# Patient Record
Sex: Male | Born: 1935 | Race: Black or African American | Hispanic: No | Marital: Married | Smoking: Never smoker
Health system: Southern US, Community
[De-identification: ages and names within clinical notes are randomized; demographics above are authoritative.]

## PROBLEM LIST (undated history)

## (undated) DIAGNOSIS — K219 Gastro-esophageal reflux disease without esophagitis: Secondary | ICD-10-CM

## (undated) DIAGNOSIS — L853 Xerosis cutis: Secondary | ICD-10-CM

## (undated) DIAGNOSIS — I719 Aortic aneurysm of unspecified site, without rupture: Secondary | ICD-10-CM

## (undated) DIAGNOSIS — J302 Other seasonal allergic rhinitis: Secondary | ICD-10-CM

## (undated) DIAGNOSIS — N400 Enlarged prostate without lower urinary tract symptoms: Secondary | ICD-10-CM

## (undated) DIAGNOSIS — K4090 Unilateral inguinal hernia, without obstruction or gangrene, not specified as recurrent: Secondary | ICD-10-CM

## (undated) DIAGNOSIS — I10 Essential (primary) hypertension: Secondary | ICD-10-CM

## (undated) DIAGNOSIS — G4730 Sleep apnea, unspecified: Secondary | ICD-10-CM

## (undated) DIAGNOSIS — B351 Tinea unguium: Secondary | ICD-10-CM

## (undated) DIAGNOSIS — R42 Dizziness and giddiness: Secondary | ICD-10-CM

## (undated) DIAGNOSIS — M1990 Unspecified osteoarthritis, unspecified site: Secondary | ICD-10-CM

## (undated) DIAGNOSIS — D696 Thrombocytopenia, unspecified: Secondary | ICD-10-CM

## (undated) DIAGNOSIS — E782 Mixed hyperlipidemia: Secondary | ICD-10-CM

## (undated) DIAGNOSIS — M5412 Radiculopathy, cervical region: Secondary | ICD-10-CM

## (undated) DIAGNOSIS — E559 Vitamin D deficiency, unspecified: Secondary | ICD-10-CM

## (undated) HISTORY — PX: CATARACT EXTRACTION W/PHACO: SHX586

## (undated) HISTORY — PX: KNEE ARTHROSCOPY: SUR90

## (undated) HISTORY — PX: EYE SURGERY: SHX253

## (undated) HISTORY — PX: COLONOSCOPY W/ POLYPECTOMY: SHX1380

## (undated) HISTORY — PX: HERNIA REPAIR: SHX51

---

## 2006-07-19 ENCOUNTER — Ambulatory Visit: Payer: Self-pay | Admitting: Pulmonary Disease

## 2006-07-25 ENCOUNTER — Ambulatory Visit: Admission: RE | Admit: 2006-07-25 | Discharge: 2006-07-25 | Payer: Self-pay | Admitting: Family Medicine

## 2009-01-18 ENCOUNTER — Encounter: Admission: RE | Admit: 2009-01-18 | Discharge: 2009-01-18 | Payer: Self-pay | Admitting: Neurology

## 2009-02-21 ENCOUNTER — Encounter: Admission: RE | Admit: 2009-02-21 | Discharge: 2009-02-21 | Payer: Self-pay | Admitting: Neurology

## 2009-03-08 ENCOUNTER — Encounter: Admission: RE | Admit: 2009-03-08 | Discharge: 2009-03-08 | Payer: Self-pay | Admitting: Neurology

## 2010-11-28 NOTE — Procedures (Signed)
NAME:  Scott Zamora, Scott Zamora NO.:  [id number]   MEDICAL RECORD NO.:  [id number]          PATIENT TYPE:  OUT   LOCATION:  SLEEP LAB                     FACILITY:  [facility]   PHYSICIAN:  Barbaraann Share, MD,FCCPDATE OF BIRTH:  09/07/36   DATE OF STUDY:  07/25/2006                            NOCTURNAL POLYSOMNOGRAM   REFERRING PHYSICIAN:  Reynolds Bowl MD   EPWORTH SCORE:  10.   SLEEP ARCHITECTURE:  The patient has a total sleep time of 224 minutes  with only 3 minutes of REM and never achieved slow wave sleep.  Sleep  onset latency was prolonged at 59 minutes and REM onset was very  prolonged at 263 minutes.  This sleep efficiency was very decreased at  56%.   RESPIRATORY DATA:  The patient underwent split night protocol where  there were found to be 37 obstructive events in the first 87 minutes of  sleep.  This gave the patient a respiratory disturbance index of 26  events per hour over that short time.  Events occurred in all body  positions and there was moderate snoring throughout.  By protocol, the  patient was then placed on a nasal CPAP mask and a chin strap was later  added because of mouth opening.  At a final pressure of 10 cm of water,  there appeared to be a good control of the patient's obstructive events.   OXYGEN DATA:  There was O2 desaturation as low as 88% prior to CPAP  initiation.   CARDIAC DATA:  No clinically significant cardiac arrhythmias were noted.   MOVEMENT-PARASOMNIA:  The patient was found to have very small numbers  of leg jerks with no significant sleep disruption.   IMPRESSION-RECOMMENDATIONS:  Split night study reveals moderate  obstructive sleep apnea with a respiratory disturbance index of 26  events per hour and O2 desaturation as low as 88% in the first half of  the night.  The patient was then placed on CPAP and ultimately titrated  to a final pressure of 10 cm of water with good control noted.      Barbaraann Share,  MD,FCCP  Diplomate, American Board of Sleep  Medicine  Electronically Signed    [facility]/MEDQ  D:  08/05/2006 16:34:53  T:  08/05/2006 22:16:13  Job:  [id number]

## 2011-08-06 DIAGNOSIS — L509 Urticaria, unspecified: Secondary | ICD-10-CM | POA: Diagnosis not present

## 2011-09-08 DIAGNOSIS — H2510 Age-related nuclear cataract, unspecified eye: Secondary | ICD-10-CM | POA: Diagnosis not present

## 2011-10-15 DIAGNOSIS — M19019 Primary osteoarthritis, unspecified shoulder: Secondary | ICD-10-CM | POA: Diagnosis not present

## 2011-11-12 DIAGNOSIS — I10 Essential (primary) hypertension: Secondary | ICD-10-CM | POA: Diagnosis not present

## 2011-11-12 DIAGNOSIS — N529 Male erectile dysfunction, unspecified: Secondary | ICD-10-CM | POA: Diagnosis not present

## 2011-11-12 DIAGNOSIS — M48061 Spinal stenosis, lumbar region without neurogenic claudication: Secondary | ICD-10-CM | POA: Diagnosis not present

## 2011-12-09 DIAGNOSIS — IMO0001 Reserved for inherently not codable concepts without codable children: Secondary | ICD-10-CM | POA: Diagnosis not present

## 2011-12-29 DIAGNOSIS — R5382 Chronic fatigue, unspecified: Secondary | ICD-10-CM | POA: Diagnosis not present

## 2011-12-29 DIAGNOSIS — G9332 Myalgic encephalomyelitis/chronic fatigue syndrome: Secondary | ICD-10-CM | POA: Diagnosis not present

## 2012-04-21 DIAGNOSIS — M19019 Primary osteoarthritis, unspecified shoulder: Secondary | ICD-10-CM | POA: Diagnosis not present

## 2012-04-28 DIAGNOSIS — I10 Essential (primary) hypertension: Secondary | ICD-10-CM | POA: Diagnosis not present

## 2012-04-28 DIAGNOSIS — Z23 Encounter for immunization: Secondary | ICD-10-CM | POA: Diagnosis not present

## 2012-04-28 DIAGNOSIS — M1990 Unspecified osteoarthritis, unspecified site: Secondary | ICD-10-CM | POA: Diagnosis not present

## 2012-06-07 DIAGNOSIS — M7660 Achilles tendinitis, unspecified leg: Secondary | ICD-10-CM | POA: Diagnosis not present

## 2012-07-28 ENCOUNTER — Encounter (HOSPITAL_COMMUNITY): Payer: Self-pay | Admitting: Pharmacy Technician

## 2012-08-05 ENCOUNTER — Encounter (HOSPITAL_COMMUNITY): Payer: Self-pay | Admitting: Pharmacy Technician

## 2012-08-05 ENCOUNTER — Encounter (HOSPITAL_COMMUNITY)
Admission: RE | Admit: 2012-08-05 | Discharge: 2012-08-05 | Disposition: A | Discharge status: Home / Self Care | Payer: Medicare Other | Source: Ambulatory Visit | Attending: Orthopedic Surgery | Admitting: Orthopedic Surgery

## 2012-08-05 ENCOUNTER — Encounter (HOSPITAL_COMMUNITY): Payer: Self-pay

## 2012-08-05 HISTORY — DX: Sleep apnea, unspecified: G47.30

## 2012-08-05 HISTORY — DX: Dizziness and giddiness: R42

## 2012-08-05 HISTORY — DX: Essential (primary) hypertension: I10

## 2012-08-05 HISTORY — DX: Unspecified osteoarthritis, unspecified site: M19.90

## 2012-08-05 HISTORY — DX: Other seasonal allergic rhinitis: J30.2

## 2012-08-05 HISTORY — DX: Xerosis cutis: L85.3

## 2012-08-05 LAB — CBC
HCT: 41.4 % (ref 39.0–52.0)
MCHC: 33.6 g/dL (ref 30.0–36.0)
MCV: 89.4 fL (ref 78.0–100.0)
RDW: 13.5 % (ref 11.5–15.5)

## 2012-08-05 LAB — TYPE AND SCREEN

## 2012-08-05 LAB — BASIC METABOLIC PANEL
BUN: 14 mg/dL (ref 6–23)
CO2: 28 mEq/L (ref 19–32)
Chloride: 102 mEq/L (ref 96–112)
Creatinine, Ser: 0.91 mg/dL (ref 0.50–1.35)

## 2012-08-05 NOTE — Progress Notes (Signed)
Patient informed Nurse that he had a stress test approximately 20+ years ago, and patient denied having a cardiac cath. Patient has mild sleep apnea and has a CPAP machine but does not wear CPAP. PCP is Dr. Kari Baars in [place], [place] office # [phone].

## 2012-08-05 NOTE — Pre-Procedure Instructions (Signed)
Scott Zamora  08/05/2012   Your procedure is scheduled on:  Thursday August 11, 2012.  Report to [facility] at 0530 AM.  Call this number if you have problems the morning of surgery: [phone]   Remember:   Do not eat food or drink liquids after midnight.   Take these medicines the morning of surgery with A SIP OF WATER: Flunisolide if needed, Gabapentin (Neurontin), Loratadine (Claritin) if needed   Do not wear jewelry  Do not wear lotions or colognes.  Men may shave face and neck.  Do not bring valuables to the hospital.  Contacts, dentures or bridgework may not be worn into surgery.  Leave suitcase in the car. After surgery it may be brought to your room.  For patients admitted to the hospital, checkout time is 11:00 AM the day of discharge.   Patients discharged the day of surgery will not be allowed to drive home.  Name and phone number of your driver:   Special Instructions: Shower using CHG 2 nights before surgery and the night before surgery.  If you shower the day of surgery use CHG.  Use special wash - you have one bottle of CHG for all showers.  You should use approximately 1/3 of the bottle for each shower.   Please read over the following fact sheets that you were given: Pain Booklet, Coughing and Deep Breathing, Blood Transfusion Information, MRSA Information and Surgical Site Infection Prevention

## 2012-08-08 NOTE — Consult Note (Signed)
Anesthesia Chart Review:  Patient is a 78 year old male scheduled for left total shoulder arthroplasty on 08/11/12 by Dr. Rennis Chris.  History includes non-smoker, OSA without CPAP use, HTN, vertigo, arthritis.  PCP is Dr. Juanetta Gosling who cleared patient for this procedure.  Preoperative labs and CXR noted.  EKG on 08/05/12 showed NSR with first degree AVB, incomplete right BBB.  Currently, there are no comparison EKGs available.    Patient has been cleared by his PCP.  No CV symptoms documented at his PAT visit.  No known history of MI/CHF or DM.  If no significant change in his status then would anticipate he could proceed as planned.  He will be evaluated by her assigned anesthesiologist on the day of surgery.  Shonna Chock, PA-C 08/08/12 1312

## 2012-08-10 MED ORDER — CHLORHEXIDINE GLUCONATE 4 % EX LIQD: 60.0000 mL | Freq: Once | CUTANEOUS | Status: DC

## 2012-08-10 MED ORDER — LACTATED RINGERS IV SOLN: INTRAVENOUS | Status: DC

## 2012-08-10 MED ORDER — CEFAZOLIN SODIUM 10 G IJ SOLR
3.0000 g | INTRAMUSCULAR | Status: AC
  Administered 2012-08-11: 3 g via INTRAVENOUS
  Filled 2012-08-10: qty 3000

## 2012-08-11 ENCOUNTER — Encounter (HOSPITAL_COMMUNITY)
Admission: RE | Disposition: A | Discharge status: Home / Self Care | Payer: Self-pay | Source: Ambulatory Visit | Attending: Orthopedic Surgery

## 2012-08-11 ENCOUNTER — Encounter (HOSPITAL_COMMUNITY): Payer: Self-pay | Admitting: Vascular Surgery

## 2012-08-11 ENCOUNTER — Encounter (HOSPITAL_COMMUNITY): Payer: Self-pay | Admitting: General Practice

## 2012-08-11 ENCOUNTER — Encounter (HOSPITAL_COMMUNITY): Payer: Self-pay | Admitting: *Deleted

## 2012-08-11 ENCOUNTER — Inpatient Hospital Stay (HOSPITAL_COMMUNITY): Payer: Medicare Other | Admitting: Vascular Surgery

## 2012-08-11 ENCOUNTER — Inpatient Hospital Stay (HOSPITAL_COMMUNITY)
Admission: RE | Admit: 2012-08-11 | Discharge: 2012-08-12 | DRG: 484 | Disposition: A | Discharge status: Home / Self Care | Payer: Medicare Other | Source: Ambulatory Visit | Attending: Orthopedic Surgery | Admitting: Orthopedic Surgery

## 2012-08-11 DIAGNOSIS — G4730 Sleep apnea, unspecified: Secondary | ICD-10-CM | POA: Diagnosis present

## 2012-08-11 DIAGNOSIS — Z79899 Other long term (current) drug therapy: Secondary | ICD-10-CM

## 2012-08-11 DIAGNOSIS — Z7982 Long term (current) use of aspirin: Secondary | ICD-10-CM

## 2012-08-11 DIAGNOSIS — Z01812 Encounter for preprocedural laboratory examination: Secondary | ICD-10-CM

## 2012-08-11 DIAGNOSIS — M19019 Primary osteoarthritis, unspecified shoulder: Principal | ICD-10-CM

## 2012-08-11 DIAGNOSIS — I10 Essential (primary) hypertension: Secondary | ICD-10-CM | POA: Diagnosis present

## 2012-08-11 HISTORY — PX: TOTAL SHOULDER ARTHROPLASTY: SHX126

## 2012-08-11 SURGERY — ARTHROPLASTY, SHOULDER, TOTAL
Anesthesia: General | Site: Shoulder | Laterality: Left | Wound class: Clean

## 2012-08-11 MED ORDER — ALUM & MAG HYDROXIDE-SIMETH 200-200-20 MG/5ML PO SUSP: 30.0000 mL | ORAL | Status: DC | PRN

## 2012-08-11 MED ORDER — PHENYLEPHRINE HCL 10 MG/ML IJ SOLN
INTRAMUSCULAR | Status: DC | PRN
  Administered 2012-08-11 (×4): 80 ug via INTRAVENOUS

## 2012-08-11 MED ORDER — METOCLOPRAMIDE HCL 5 MG/ML IJ SOLN: 5.0000 mg | Freq: Three times a day (TID) | INTRAMUSCULAR | Status: DC | PRN

## 2012-08-11 MED ORDER — METOCLOPRAMIDE HCL 10 MG PO TABS: 5.0000 mg | ORAL_TABLET | Freq: Three times a day (TID) | ORAL | Status: DC | PRN

## 2012-08-11 MED ORDER — LACTATED RINGERS IV SOLN
INTRAVENOUS | Status: DC | PRN
  Administered 2012-08-11 (×3): via INTRAVENOUS

## 2012-08-11 MED ORDER — LACTATED RINGERS IV SOLN
INTRAVENOUS | Status: DC
  Administered 2012-08-11: 75 mL/h via INTRAVENOUS
  Administered 2012-08-12: 03:00:00 via INTRAVENOUS

## 2012-08-11 MED ORDER — GABAPENTIN 100 MG PO CAPS
100.0000 mg | ORAL_CAPSULE | Freq: Every day | ORAL | Status: DC
  Administered 2012-08-12: 100 mg via ORAL
  Filled 2012-08-11: qty 1

## 2012-08-11 MED ORDER — EPHEDRINE SULFATE 50 MG/ML IJ SOLN
INTRAMUSCULAR | Status: DC | PRN
  Administered 2012-08-11: 10 mg via INTRAVENOUS
  Administered 2012-08-11: 5 mg via INTRAVENOUS
  Administered 2012-08-11 (×2): 10 mg via INTRAVENOUS
  Administered 2012-08-11: 5 mg via INTRAVENOUS

## 2012-08-11 MED ORDER — DOCUSATE SODIUM 100 MG PO CAPS
100.0000 mg | ORAL_CAPSULE | Freq: Two times a day (BID) | ORAL | Status: DC
  Administered 2012-08-11 – 2012-08-12 (×2): 100 mg via ORAL
  Filled 2012-08-11 (×3): qty 1

## 2012-08-11 MED ORDER — HYDROCHLOROTHIAZIDE 25 MG PO TABS
12.5000 mg | ORAL_TABLET | Freq: Every day | ORAL | Status: DC
  Filled 2012-08-11: qty 0.5

## 2012-08-11 MED ORDER — VANCOMYCIN HCL IN DEXTROSE 1-5 GM/200ML-% IV SOLN
INTRAVENOUS | Status: AC
  Filled 2012-08-11: qty 200

## 2012-08-11 MED ORDER — KETOROLAC TROMETHAMINE 15 MG/ML IJ SOLN
15.0000 mg | Freq: Four times a day (QID) | INTRAMUSCULAR | Status: DC
  Administered 2012-08-11 – 2012-08-12 (×4): 15 mg via INTRAVENOUS
  Filled 2012-08-11 (×7): qty 1

## 2012-08-11 MED ORDER — PHENYLEPHRINE HCL 10 MG/ML IJ SOLN
10.0000 mg | INTRAVENOUS | Status: DC | PRN
  Administered 2012-08-11: 15 ug/min via INTRAVENOUS

## 2012-08-11 MED ORDER — ROCURONIUM BROMIDE 100 MG/10ML IV SOLN
INTRAVENOUS | Status: DC | PRN
  Administered 2012-08-11: 35 mg via INTRAVENOUS

## 2012-08-11 MED ORDER — PROPOFOL 10 MG/ML IV BOLUS
INTRAVENOUS | Status: DC | PRN
  Administered 2012-08-11: 150 mg via INTRAVENOUS

## 2012-08-11 MED ORDER — HYDROCHLOROTHIAZIDE 12.5 MG PO CAPS
12.5000 mg | ORAL_CAPSULE | Freq: Every day | ORAL | Status: DC
  Administered 2012-08-12: 12.5 mg via ORAL
  Filled 2012-08-11 (×2): qty 1

## 2012-08-11 MED ORDER — CEFAZOLIN SODIUM 1-5 GM-% IV SOLN
1.0000 g | Freq: Four times a day (QID) | INTRAVENOUS | Status: AC
  Administered 2012-08-11 – 2012-08-12 (×2): 1 g via INTRAVENOUS
  Filled 2012-08-11 (×3): qty 50

## 2012-08-11 MED ORDER — PHENOL 1.4 % MT LIQD: 1.0000 | OROMUCOSAL | Status: DC | PRN

## 2012-08-11 MED ORDER — NEOSTIGMINE METHYLSULFATE 1 MG/ML IJ SOLN
INTRAMUSCULAR | Status: DC | PRN
  Administered 2012-08-11: 3 mg via INTRAVENOUS

## 2012-08-11 MED ORDER — TERAZOSIN HCL 5 MG PO CAPS
10.0000 mg | ORAL_CAPSULE | Freq: Every day | ORAL | Status: DC
  Administered 2012-08-11: 10 mg via ORAL
  Filled 2012-08-11 (×2): qty 2

## 2012-08-11 MED ORDER — ONDANSETRON HCL 4 MG/2ML IJ SOLN: 4.0000 mg | Freq: Once | INTRAMUSCULAR | Status: DC | PRN

## 2012-08-11 MED ORDER — MENTHOL 3 MG MT LOZG
1.0000 | LOZENGE | OROMUCOSAL | Status: DC | PRN
  Filled 2012-08-11: qty 9

## 2012-08-11 MED ORDER — KETOROLAC TROMETHAMINE 30 MG/ML IJ SOLN
INTRAMUSCULAR | Status: AC
  Filled 2012-08-11: qty 1

## 2012-08-11 MED ORDER — VERAPAMIL HCL ER 240 MG PO TBCR
240.0000 mg | EXTENDED_RELEASE_TABLET | Freq: Every day | ORAL | Status: DC
  Administered 2012-08-11: 240 mg via ORAL
  Filled 2012-08-11 (×2): qty 1

## 2012-08-11 MED ORDER — FLEET ENEMA 7-19 GM/118ML RE ENEM: 1.0000 | ENEMA | Freq: Once | RECTAL | Status: AC | PRN

## 2012-08-11 MED ORDER — HYDROMORPHONE HCL PF 1 MG/ML IJ SOLN
0.2500 mg | INTRAMUSCULAR | Status: DC | PRN
  Administered 2012-08-12: 1 mg via INTRAVENOUS
  Filled 2012-08-11: qty 1

## 2012-08-11 MED ORDER — ACETAMINOPHEN 650 MG RE SUPP: 650.0000 mg | Freq: Four times a day (QID) | RECTAL | Status: DC | PRN

## 2012-08-11 MED ORDER — ONDANSETRON HCL 4 MG/2ML IJ SOLN
4.0000 mg | Freq: Four times a day (QID) | INTRAMUSCULAR | Status: DC | PRN
  Administered 2012-08-11: 4 mg via INTRAVENOUS
  Filled 2012-08-11 (×2): qty 2

## 2012-08-11 MED ORDER — HYDROMORPHONE HCL PF 1 MG/ML IJ SOLN: 0.2500 mg | INTRAMUSCULAR | Status: DC | PRN

## 2012-08-11 MED ORDER — CALCIUM CHLORIDE 10 % IV SOLN
INTRAVENOUS | Status: DC | PRN
  Administered 2012-08-11 (×10): 100 mg via INTRAVENOUS

## 2012-08-11 MED ORDER — MIDAZOLAM HCL 5 MG/5ML IJ SOLN
INTRAMUSCULAR | Status: DC | PRN
  Administered 2012-08-11: 2 mg via INTRAVENOUS

## 2012-08-11 MED ORDER — ONDANSETRON HCL 4 MG/2ML IJ SOLN
INTRAMUSCULAR | Status: DC | PRN
  Administered 2012-08-11: 4 mg via INTRAVENOUS

## 2012-08-11 MED ORDER — SODIUM CHLORIDE 0.9 % IR SOLN
Status: DC | PRN
  Administered 2012-08-11: 1000 mL

## 2012-08-11 MED ORDER — OXYCODONE-ACETAMINOPHEN 5-325 MG PO TABS
1.0000 | ORAL_TABLET | ORAL | Status: DC | PRN
  Administered 2012-08-12 (×2): 2 via ORAL
  Filled 2012-08-11 (×2): qty 2

## 2012-08-11 MED ORDER — ACETAMINOPHEN 325 MG PO TABS: 650.0000 mg | ORAL_TABLET | Freq: Four times a day (QID) | ORAL | Status: DC | PRN

## 2012-08-11 MED ORDER — METHOCARBAMOL 100 MG/ML IJ SOLN
500.0000 mg | Freq: Four times a day (QID) | INTRAVENOUS | Status: DC | PRN
  Filled 2012-08-11: qty 5

## 2012-08-11 MED ORDER — DIPHENHYDRAMINE HCL 12.5 MG/5ML PO ELIX: 12.5000 mg | ORAL_SOLUTION | ORAL | Status: DC | PRN

## 2012-08-11 MED ORDER — POLYETHYLENE GLYCOL 3350 17 G PO PACK: 17.0000 g | PACK | Freq: Every day | ORAL | Status: DC | PRN

## 2012-08-11 MED ORDER — LIDOCAINE HCL (CARDIAC) 20 MG/ML IV SOLN
INTRAVENOUS | Status: DC | PRN
  Administered 2012-08-11: 100 mg via INTRAVENOUS

## 2012-08-11 MED ORDER — ACETAMINOPHEN 10 MG/ML IV SOLN
INTRAVENOUS | Status: AC
  Administered 2012-08-11: 1000 mg via INTRAVENOUS
  Filled 2012-08-11: qty 100

## 2012-08-11 MED ORDER — FENTANYL CITRATE 0.05 MG/ML IJ SOLN
INTRAMUSCULAR | Status: DC | PRN
  Administered 2012-08-11 (×2): 50 ug via INTRAVENOUS

## 2012-08-11 MED ORDER — GLYCOPYRROLATE 0.2 MG/ML IJ SOLN
INTRAMUSCULAR | Status: DC | PRN
  Administered 2012-08-11: .6 mg via INTRAVENOUS
  Administered 2012-08-11: 0.4 mg via INTRAVENOUS

## 2012-08-11 MED ORDER — TEMAZEPAM 15 MG PO CAPS: 15.0000 mg | ORAL_CAPSULE | Freq: Every evening | ORAL | Status: DC | PRN

## 2012-08-11 MED ORDER — BUPIVACAINE HCL (PF) 0.5 % IJ SOLN
INTRAMUSCULAR | Status: DC | PRN
  Administered 2012-08-11: 18 mL

## 2012-08-11 MED ORDER — BISACODYL 5 MG PO TBEC: 5.0000 mg | DELAYED_RELEASE_TABLET | Freq: Every day | ORAL | Status: DC | PRN

## 2012-08-11 MED ORDER — ONDANSETRON HCL 4 MG PO TABS: 4.0000 mg | ORAL_TABLET | Freq: Four times a day (QID) | ORAL | Status: DC | PRN

## 2012-08-11 MED ORDER — METHOCARBAMOL 500 MG PO TABS
500.0000 mg | ORAL_TABLET | Freq: Four times a day (QID) | ORAL | Status: DC | PRN
  Administered 2012-08-12 (×2): 500 mg via ORAL
  Filled 2012-08-11 (×2): qty 1

## 2012-08-11 SURGICAL SUPPLY — 69 items
BLADE SAW SGTL 83.5X18.5 (BLADE) ×2 IMPLANT
CEMENT BONE DEPUY (Cement) ×2 IMPLANT
CLOTH BEACON ORANGE TIMEOUT ST (SAFETY) ×2 IMPLANT
CLSR STERI-STRIP ANTIMIC 1/2X4 (GAUZE/BANDAGES/DRESSINGS) ×2 IMPLANT
COVER SURGICAL LIGHT HANDLE (MISCELLANEOUS) ×2 IMPLANT
DRAPE INCISE IOBAN 66X45 STRL (DRAPES) ×2 IMPLANT
DRAPE SURG 17X11 SM STRL (DRAPES) ×2 IMPLANT
DRAPE SURG 17X23 STRL (DRAPES) ×2 IMPLANT
DRAPE U-SHAPE 47X51 STRL (DRAPES) ×2 IMPLANT
DRESSING AQUACEL AQ EXTRA 4X5 (GAUZE/BANDAGES/DRESSINGS) ×2 IMPLANT
DRILL BIT 7/64X5 (BIT) IMPLANT
DRSG AQUACEL AG ADV 3.5X10 (GAUZE/BANDAGES/DRESSINGS) ×2 IMPLANT
DRSG MEPILEX BORDER 4X8 (GAUZE/BANDAGES/DRESSINGS) ×2 IMPLANT
DURAPREP 26ML APPLICATOR (WOUND CARE) ×4 IMPLANT
ELECT BLADE 4.0 EZ CLEAN MEGAD (MISCELLANEOUS) ×2
ELECT CAUTERY BLADE 6.4 (BLADE) ×2 IMPLANT
ELECT REM PT RETURN 9FT ADLT (ELECTROSURGICAL) ×2
ELECTRODE BLDE 4.0 EZ CLN MEGD (MISCELLANEOUS) ×1 IMPLANT
ELECTRODE REM PT RTRN 9FT ADLT (ELECTROSURGICAL) ×1 IMPLANT
FACESHIELD LNG OPTICON STERILE (SAFETY) ×6 IMPLANT
GLENOID ANCHOR PEG CROSSLK 48 (Orthopedic Implant) ×2 IMPLANT
GLOVE BIO SURGEON STRL SZ 6.5 (GLOVE) ×2 IMPLANT
GLOVE BIO SURGEON STRL SZ7.5 (GLOVE) ×4 IMPLANT
GLOVE BIO SURGEON STRL SZ8 (GLOVE) ×2 IMPLANT
GLOVE BIOGEL PI IND STRL 7.5 (GLOVE) ×1 IMPLANT
GLOVE BIOGEL PI IND STRL 8.5 (GLOVE) ×1 IMPLANT
GLOVE BIOGEL PI INDICATOR 7.5 (GLOVE) ×1
GLOVE BIOGEL PI INDICATOR 8.5 (GLOVE) ×1
GLOVE ECLIPSE 8.0 STRL XLNG CF (GLOVE) ×2 IMPLANT
GLOVE EUDERMIC 7 POWDERFREE (GLOVE) ×2 IMPLANT
GLOVE SS BIOGEL STRL SZ 7.5 (GLOVE) ×1 IMPLANT
GLOVE SUPERSENSE BIOGEL SZ 7.5 (GLOVE) ×1
GOWN STRL NON-REIN LRG LVL3 (GOWN DISPOSABLE) ×2 IMPLANT
GOWN STRL REIN XL XLG (GOWN DISPOSABLE) ×4 IMPLANT
HEAD CTA GLOBAL 52X21MM (Orthopedic Implant) ×2 IMPLANT
HUMERAL STEM 14MM (Trauma) ×2 IMPLANT
KIT BASIN OR (CUSTOM PROCEDURE TRAY) ×2 IMPLANT
KIT ROOM TURNOVER OR (KITS) ×2 IMPLANT
MANIFOLD NEPTUNE II (INSTRUMENTS) ×2 IMPLANT
NDL SUT 6 .5 CRC .975X.05 MAYO (NEEDLE) ×1 IMPLANT
NEEDLE HYPO 25GX1X1/2 BEV (NEEDLE) IMPLANT
NEEDLE MAYO TAPER (NEEDLE) ×2
NS IRRIG 1000ML POUR BTL (IV SOLUTION) ×2 IMPLANT
PACK SHOULDER (CUSTOM PROCEDURE TRAY) ×2 IMPLANT
PAD ARMBOARD 7.5X6 YLW CONV (MISCELLANEOUS) ×4 IMPLANT
PASSER SUT SWANSON 36MM LOOP (INSTRUMENTS) IMPLANT
PIN METAGLENE 2.5 (PIN) ×2 IMPLANT
SLING ARM FOAM STRAP LRG (SOFTGOODS) ×2 IMPLANT
SLING ARM FOAM STRAP XLG (SOFTGOODS) IMPLANT
SLING ARM IMMOBILIZER LRG (SOFTGOODS) ×2 IMPLANT
SMARTMIX MINI TOWER (MISCELLANEOUS) ×2
SPONGE LAP 18X18 X RAY DECT (DISPOSABLE) ×2 IMPLANT
SPONGE LAP 4X18 X RAY DECT (DISPOSABLE) ×2 IMPLANT
STRIP CLOSURE SKIN 1/2X4 (GAUZE/BANDAGES/DRESSINGS) ×2 IMPLANT
SUCTION FRAZIER TIP 10 FR DISP (SUCTIONS) ×2 IMPLANT
SUT BONE WAX W31G (SUTURE) IMPLANT
SUT FIBERWIRE #2 38 T-5 BLUE (SUTURE) ×4
SUT MNCRL AB 3-0 PS2 18 (SUTURE) ×2 IMPLANT
SUT VIC AB 1 CT1 27 (SUTURE) ×3
SUT VIC AB 1 CT1 27XBRD ANBCTR (SUTURE) ×3 IMPLANT
SUT VIC AB 2-0 CT1 27 (SUTURE) ×2
SUT VIC AB 2-0 CT1 TAPERPNT 27 (SUTURE) ×2 IMPLANT
SUTURE FIBERWR #2 38 T-5 BLUE (SUTURE) ×2 IMPLANT
SYR 30ML SLIP (SYRINGE) ×2 IMPLANT
SYR CONTROL 10ML LL (SYRINGE) IMPLANT
TOWEL OR 17X24 6PK STRL BLUE (TOWEL DISPOSABLE) ×2 IMPLANT
TOWEL OR 17X26 10 PK STRL BLUE (TOWEL DISPOSABLE) ×2 IMPLANT
TOWER SMARTMIX MINI (MISCELLANEOUS) ×1 IMPLANT
WATER STERILE IRR 1000ML POUR (IV SOLUTION) ×2 IMPLANT

## 2012-08-11 NOTE — Op Note (Signed)
08/11/2012  9:41 AM  PATIENT:   Scott Zamora  78 y.o. male  PRE-OPERATIVE DIAGNOSIS:  Osteoarthritis of the Left Shoudler  POST-OPERATIVE DIAGNOSIS:  same  PROCEDURE:  Left total shoulder arthroplasty 14 stem, 52X21 concentric head, 48 glenoid  SURGEON:  Lavora Brisbon, Vania Rea M.D.  ASSISTANTS: Shuford pac   ANESTHESIA:   GET + ISB  EBL: 250  SPECIMEN:  none  Drains: none   PATIENT DISPOSITION:  PACU - hemodynamically stable.    PLAN OF CARE: Admit to inpatient   Dictation# [id number], [id number]

## 2012-08-11 NOTE — Progress Notes (Signed)
UR COMPLETED  

## 2012-08-11 NOTE — Anesthesia Preprocedure Evaluation (Signed)
Anesthesia Evaluation  Patient identified by MRN, date of birth, ID band Patient awake    Reviewed: Allergy & Precautions, H&P , NPO status , Patient's Chart, lab work & pertinent test results  Airway Mallampati: I TM Distance: >3 FB Neck ROM: full    Dental   Pulmonary sleep apnea ,          Cardiovascular hypertension, Rhythm:regular Rate:Normal     Neuro/Psych    GI/Hepatic   Endo/Other    Renal/GU      Musculoskeletal   Abdominal   Peds  Hematology   Anesthesia Other Findings   Reproductive/Obstetrics                           Anesthesia Physical Anesthesia Plan  ASA: II  Anesthesia Plan: General   Post-op Pain Management: MAC Combined w/ Regional for Post-op pain   Induction: Intravenous  Airway Management Planned: Oral ETT  Additional Equipment:   Intra-op Plan:   Post-operative Plan: Extubation in OR  Informed Consent: I have reviewed the patients History and Physical, chart, labs and discussed the procedure including the risks, benefits and alternatives for the proposed anesthesia with the patient or authorized representative who has indicated his/her understanding and acceptance.     Plan Discussed with: CRNA, Anesthesiologist and Surgeon  Anesthesia Plan Comments:         Anesthesia Quick Evaluation

## 2012-08-11 NOTE — H&P (Signed)
Scott Zamora    Chief Complaint: Osteoarthritis of the Left Shoudler HPI: The patient is a 78 y.o. male with end stage left shoulder arthritis  Past Medical History  Diagnosis Date  . Hypertension   . Vertigo     hx of  . Sleep apnea     has CPAP but does not wear it  . Seasonal allergies   . Arthritis   . Dry skin     Past Surgical History  Procedure Date  . Knee arthroscopy     left knee  . Colonoscopy w/ polypectomy     History reviewed. No pertinent family history.  Social History:  reports that he has never smoked. He does not have any smokeless tobacco history on file. He reports that he does not drink alcohol or use illicit drugs.  Allergies: No Known Allergies  Medications Prior to Admission  Medication Sig Dispense Refill  . acetaminophen (TYLENOL) 325 MG tablet Take 650 mg by mouth every 6 (six) hours as needed. For pain      . aspirin EC 81 MG tablet Take 81 mg by mouth daily.      . Cholecalciferol (VITAMIN D-3) 1000 UNITS CAPS Take 1,000 Units by mouth daily.      . flunisolide (NASALIDE) 25 MCG/ACT (0.025%) SOLN Inhale 2 sprays into the lungs daily as needed. For congestion.      . gabapentin (NEURONTIN) 100 MG capsule Take 100 mg by mouth daily.      . hydrochlorothiazide (HYDRODIURIL) 25 MG tablet Take 12.5 mg by mouth daily.      Marland Kitchen KOREAN GINSENG 100 MG CAPS Take 100 mg by mouth daily.      Marland Kitchen loratadine (CLARITIN) 10 MG tablet Take 10 mg by mouth daily as needed. For allergies      . naproxen (NAPROSYN) 500 MG tablet Take 500 mg by mouth 2 (two) times daily as needed. For pain      . POTASSIUM PO Take 1 tablet by mouth daily as needed. OTC.  Takes only when experiencing lethargy due to blood pressure medication.      Marland Kitchen terazosin (HYTRIN) 10 MG capsule Take 10 mg by mouth at bedtime.      . verapamil (CALAN-SR) 240 MG CR tablet Take 240 mg by mouth at bedtime.      . vitamin B-12 (CYANOCOBALAMIN) 250 MCG tablet Take 250 mcg by mouth daily.      .  sildenafil (VIAGRA) 100 MG tablet Take 50 mg by mouth daily as needed. For erectile dysfunction         Physical Exam: left shoulder with painful and restricted ROM as noted at 07/27/12 office visit  Vitals  Temp:  [97.8 F (36.6 C)] 97.8 F (36.6 C) (01/30 0601) Pulse Rate:  [63] 63  (01/30 0601) Resp:  [18] 18  (01/30 0601) BP: (128)/(82) 128/82 mmHg (01/30 0601) SpO2:  [98 %] 98 % (01/30 0601)  Assessment/Plan  Impression: Osteoarthritis of the Left Shoudler  Plan of Action: Procedure(s): TOTAL SHOULDER ARTHROPLASTY  Dalene Robards M 08/11/2012, 7:32 AM

## 2012-08-11 NOTE — Anesthesia Postprocedure Evaluation (Signed)
  Anesthesia Post-op Note  Patient: Scott Zamora  Procedure(s) Performed: Procedure(s) (LRB) with comments: TOTAL SHOULDER ARTHROPLASTY (Left)  Patient Location: PACU  Anesthesia Type:GA combined with regional for post-op pain  Level of Consciousness: awake, alert , oriented and patient cooperative  Airway and Oxygen Therapy: Patient Spontanous Breathing  Post-op Pain: mild  Post-op Assessment: Post-op Vital signs reviewed, Patient's Cardiovascular Status Stable, Respiratory Function Stable, Patent Airway, No signs of Nausea or vomiting and Pain level controlled  Post-op Vital Signs: stable  Complications: No apparent anesthesia complications

## 2012-08-11 NOTE — Op Note (Signed)
NAMEDAVIT, VASSAR                ACCOUNT NO.:  [id number]  MEDICAL RECORD NO.:  [id number]  LOCATION:  [facility]                         FACILITY:  [facility]  PHYSICIAN:  Vania Rea. Jazlen Ogarro, M.D.  DATE OF BIRTH:  February 15, 1935  DATE OF PROCEDURE:  08/11/2012 DATE OF DISCHARGE:                              OPERATIVE REPORT   PREOPERATIVE DIAGNOSIS:  End-stage left shoulder osteoarthritis.  POSTOPERATIVE DIAGNOSIS:  End-stage left shoulder osteoarthritis.  PROCEDURE:  A left total shoulder arthroplasty utilizing a press-fit size 14, diffuse global stem, a 52 x 21 concentric humeral head and a cemented pegged 40 glenoid.  SURGEON:  Vania Rea. Amariyon Maynes, M.D.  Threasa HeadsFrench Ana A. Shuford, PAC.  ANESTHESIA:  General endotracheal as well as an interscalene block.  ESTIMATED BLOOD LOSS:  250 mL.  DRAINS:  None,  HISTORY:  Mr. Mckeone is a 78 year old gentleman who has had chronic progressive increasing bilateral shoulder pain related to end-stage arthrosis, left more symptomatic than the right.  Plain radiographs confirm end-stage arthrosis with significant periarticular osteophyte formation and bony deformity consistent with osteoarthrosis.  He is brought to the operating room at this time for planned left total shoulder arthroplasty.  I preoperatively counseled Mr. Sprinkle on treatment options as well as risks versus benefits thereof.  Possible surgical complications were reviewed including potential for bleeding, infection, neurovascular injury, persistent pain, loss of motion, anesthetic complication, failure of implant, and possible need for additional surgery.  He understands and accepts and agrees to planned procedure.  PROCEDURE IN DETAIL:  After undergoing routine preop evaluation, the patient received prophylactic antibiotics and an interscalene block was established in the holding area with the Anesthesia Department.  Placed supine on the operating table, underwent smooth induction  of a general endotracheal anesthesia.  Placed in beach-chair position and appropriately padded and protected.  The left shoulder girdle region was sterilely prepped and draped in standard fashion.  Time-out was called. An anterior approach to the left shoulder was made through a deltopectoral incision approximately 15 cm in length.  Skin flaps were elevated.  Electrocautery was used for hemostasis.  Dissection carried deeply and the cephalic vein was identified, retracted, laterally with the deltoid.  The interval was developed distally and then the upper centimeter and a half of the peg major was tenotomized for exposure. Adhesions were divided in the subacromial/subdeltoid bursal region. Conjoined tendon was identified, mobilized, and retracted medially.  The bicipital groove was identified and found a very large osteophyte emanating from the medial margin of the bicipital groove extending distally and this was removed with a rongeur, and then the bicipital groove was unroofed.  Biceps tendon was tenotomized for later tenodesis. We then divided proximally along the rotator interval towards the base of the coracoid, and then the subscapularis was divided away from the lesser tuberosity with electrocautery, and a free margin was then tagged with a series of grasping #2 FiberWire sutures leaving a cuff of tissue laterally for later repair.  We also found a very prominent osteophyte emanating from the anteroinferior aspect of the humeral head, and this was carefully dissected away allowing delivery of the humeral head through the wound and releasing the capsule inferiorly  and subsequently posteriorly to allow complete delivery of the humeral head through the wound.  Then used an osteotome to excise the majority of the overhanging osteophyte from the anteroinferior portion of the humeral head with enhanced visualization.  Once the head was exposed, we used an extramedullary guide to outline  the proposed humeral head resection at approximately 30 degrees retroversion, and this was then completed utilizing the oscillating saw.  Once this was completed, we carefully protected the rotator cuff.  We then used a series of hand reamers to gain access to the humeral medullary canal reamed up to size 14.  We then used the cookie cutter broach for the size 14 stem, and then broached up to size 14 with excellent fit and fixation.  Once this was completed, we placed a metal cap over the cut surface of the proximal humerus and then used a combination of Fukuda, pitchfork, and snake tongue retractors to expose the glenoid and performed a circumferential excision of the labrum and released the capsule from the margins of the glenoid to allow excellent visualization of the glenoid and good mild-to- moderate glenoid retroversion.  A large loose body was identified and removed from the joint.  A guidepin was then placed at the center of the glenoid.  We used the 40 reamer to produce a smooth subchondral bony surface allowing correction of the retroversion to aid neutral alignment.  The central PEG was drilled and then using the appropriate triple guide, we placed peripheral drill holes for the size 48 trial then excellent fit.  The glenoid was irrigated, meticulously dried. Cement was then mixed and introduced into the peripheral peg holes at the appropriate consistency, and the final 48 glenoid was then impacted into position with excellent fit fixation.  Once the cement was allowed to harden, we then returned our attention to the proximal humerus.  We impacted our size 14 stem, utilizing bone graft we harvested from the resected humeral head to bone graft the metaphyseal portion.  This allowed also excellent fit and fixation.  We then performed trial reductions and based on the size of the head resected the soft tissue balance, we selected the 52 x 21 humeral head, and this was showed  soft tissue balance much to our satisfaction, with 50% translation of the humeral head and glenoid.  At this point, final irrigation was completed.  We impacted the final size 52 x 21 humeral head, and a final reduction.  Hemostasis was obtained.  The subscapularis was then repaired back to the lesser tuberosity using the FiberWire.  The rotator interval was also closed with a pair of figure-of-eight Vicryl sutures. We then performed a biceps tenodesis.  The upper margin of the major with #2 FiberWire.  At this time, shoulder mobility showed excellent soft tissue balance with good motion.  The deltopectoral interval was then allowed to reapproximate and closed with #1 Vicryl figure-of-eight sutures, 2-0 Vicryl for the subcu layer, and intracuticular 3-0 Monocryl for the skin followed by Steri-Strips.  Dry dressing was applied.  The left arm was placed in a sling.  The patient was awakened, extubated, and taken to recovery room in stable condition.     Vania Rea. Ardene Remley, M.D.     [facility]/MEDQ  D:  08/11/2012  T:  08/11/2012  Job:  [id number]

## 2012-08-11 NOTE — Transfer of Care (Signed)
Immediate Anesthesia Transfer of Care Note  Patient: Scott Zamora  Procedure(s) Performed: Procedure(s) (LRB) with comments: TOTAL SHOULDER ARTHROPLASTY (Left)  Patient Location: PACU  Anesthesia Type:General  Level of Consciousness: awake, alert  and oriented  Airway & Oxygen Therapy: Patient Spontanous Breathing and Patient connected to nasal cannula oxygen  Post-op Assessment: Report given to PACU RN, Post -op Vital signs reviewed and stable and Patient moving all extremities  Post vital signs: Reviewed and stable  Complications: No apparent anesthesia complications

## 2012-08-11 NOTE — Anesthesia Procedure Notes (Signed)
Anesthesia Regional Block:  Interscalene brachial plexus block  Pre-Anesthetic Checklist: ,, timeout performed, Correct Patient, Correct Site, Correct Laterality, Correct Procedure, Correct Position, site marked, Risks and benefits discussed,  Surgical consent,  Pre-op evaluation,  At surgeon's request and post-op pain management  Laterality: Left  Prep: Maximum Sterile Barrier Precautions used, chloraprep and alcohol swabs       Needles:  Injection technique: Single-shot  Needle Type: Stimulator Needle - 40        Needle insertion depth: 4 cm   Additional Needles:  Procedures: nerve stimulator Interscalene brachial plexus block  Nerve Stimulator or Paresthesia:  Response: 0.5 mA, 0.1 ms, 4 cm  Additional Responses:   Narrative:  Start time: 08/11/2012 7:05 AM End time: 08/11/2012 7:10 AM Injection made incrementally with aspirations every 5 mL.  Performed by: Personally  Anesthesiologist: Maren Beach MD  Additional Notes: Pt accepts procedure and risks. 18cc 0.5% Marcaine w/o epi w/o difficulty or discomfort. GES

## 2012-08-11 NOTE — Op Note (Signed)
Scott Zamora, Scott Zamora NO.:  [id number]  MEDICAL RECORD NO.:  [id number]  LOCATION:  5N11C                        FACILITY:  [facility]  PHYSICIAN:  Vania Rea. Shree Espey, M.D.  DATE OF BIRTH:  11/18/1934  DATE OF PROCEDURE: DATE OF DISCHARGE:                              OPERATIVE REPORT   ADDENDUM  Tracy A. Shuford, P.A.-C was used as Geophysicist/field seismologist throughout this case essential for help with positioning of the extremity, management of the tissues retraction, implantation of the implant, exposure, wound closure, and intraoperative decision making.     Vania Rea. Loris Seelye, M.D.     K[facility]/MEDQ  D:  08/11/2012  T:  08/11/2012  Job:  [id number]

## 2012-08-12 ENCOUNTER — Encounter (HOSPITAL_COMMUNITY): Payer: Self-pay | Admitting: Orthopedic Surgery

## 2012-08-12 DIAGNOSIS — M19019 Primary osteoarthritis, unspecified shoulder: Secondary | ICD-10-CM

## 2012-08-12 MED ORDER — OXYCODONE-ACETAMINOPHEN 5-325 MG PO TABS: 1.0000 | ORAL_TABLET | ORAL | Status: DC | PRN

## 2012-08-12 MED ORDER — ONDANSETRON HCL 4 MG PO TABS: 4.0000 mg | ORAL_TABLET | Freq: Three times a day (TID) | ORAL | Status: DC | PRN

## 2012-08-12 NOTE — Discharge Summary (Signed)
PATIENT ID:      Scott Zamora  MRN:     [id number] DOB/AGE:    1934/10/01 / 78 y.o.     DISCHARGE SUMMARY  ADMISSION DATE:    08/11/2012 DISCHARGE DATE:  08/12/12  ADMISSION DIAGNOSIS: Osteoarthritis of the Left Shoudler Past Medical History  Diagnosis Date  . Hypertension   . Vertigo     hx of  . Sleep apnea     has CPAP but does not wear it  . Seasonal allergies   . Arthritis   . Dry skin     DISCHARGE DIAGNOSIS:   Principal Problem:  *Arthritis of shoulder   PROCEDURE: Procedure(s): LEFT TOTAL SHOULDER ARTHROPLASTY on 08/11/2012  CONSULTS:   none  HISTORY:  See H&P in chart.  HOSPITAL COURSE:  Scott Zamora is a 78 y.o. admitted on 08/11/2012 with a chief complaint of left shoulder pain, and found to have a diagnosis of Osteoarthritis of the Left Shoudler.  They were brought to the operating room on 08/11/2012 and underwent Procedure(s): TOTAL SHOULDER ARTHROPLASTY.    They were given perioperative antibiotics: Anti-infectives     Start     Dose/Rate Route Frequency Ordered Stop   08/11/12 1430   ceFAZolin (ANCEF) IVPB 1 g/50 mL premix        1 g 100 mL/hr over 30 Minutes Intravenous Every 6 hours 08/11/12 1336 08/12/12 0829   08/11/12 0656   vancomycin (VANCOCIN) 1 GM/200ML IVPB     Comments: HUNT, JENNIFER: cabinet override         08/11/12 0656 08/11/12 1859   08/11/12 0600   ceFAZolin (ANCEF) 3 g in dextrose 5 % 50 mL IVPB        3 g 160 mL/hr over 30 Minutes Intravenous On call to O.R. 08/10/12 1259 08/11/12 0800        .  Patient underwent the above named procedure and tolerated it well. The following day they were hemodynamically stable and pain was controlled on oral analgesics. They were neurovascularly intact to the operative extremity. OT was ordered and worked with patient per protocol. They were medically and orthopaedically stable for discharge on day 1 . Patient was voiding well and had mild gas otherwise was tolerating po's.    DIAGNOSTIC  STUDIES:  RECENT RADIOGRAPHIC STUDIES :  Dg Chest 2 View  08/05/2012  *RADIOLOGY REPORT*  Clinical Data: Preop shoulder arthroplasty  CHEST - 2 VIEW  Comparison: None.  Findings: Normal cardiac silhouette with ectatic aorta.  No effusion, infiltrate, pneumothorax peri Degenerative osteophytosis of the thoracic spine.  Degenerative shoulder changes noted in the shoulders.  IMPRESSION: No acute cardiopulmonary process.   Original Report Authenticated By: Genevive Bi, M.D.     RECENT VITAL SIGNS:  Patient Vitals for the past 24 hrs:  BP Temp Temp src Pulse Resp SpO2 Height Weight  08/12/12 0512 128/78 mmHg 98.9 F (37.2 C) Oral 89  16  95 % - -  08/11/12 2237 - - - - - - 5\' 11"  (1.803 m) 85.73 kg (189 lb)  08/11/12 2016 127/88 mmHg 98.5 F (36.9 C) Oral 97  16  100 % - -  08/11/12 1514 137/85 mmHg 97.5 F (36.4 C) - 65  16  100 % - -  08/11/12 1209 - - - 62  14  100 % - -  08/11/12 1200 - 97.4 F (36.3 C) - 62  17  100 % - -  08/11/12 1154 120/78 mmHg - - 62  17  100 % - -  08/11/12 1145 - - - 62  9  100 % - -  08/11/12 1139 118/74 mmHg - - 61  16  100 % - -  08/11/12 1130 - - - 62  19  100 % - -  08/11/12 1124 120/79 mmHg - - 63  21  100 % - -  08/11/12 1115 - 96.5 F (35.8 C) - 61  - 100 % - -  08/11/12 1110 118/76 mmHg - - 60  10  100 % - -  08/11/12 1100 - 96.3 F (35.7 C) - 65  16  100 % - -  08/11/12 1054 125/82 mmHg - - 67  19  99 % - -  08/11/12 1045 - - - 66  19  100 % - -  08/11/12 1039 115/75 mmHg - - 65  15  100 % - -  08/11/12 1030 - 96.3 F (35.7 C) - 60  14  100 % - -  08/11/12 1024 115/75 mmHg - - 63  15  100 % - -  08/11/12 1015 - 96.2 F (35.7 C) - 65  16  100 % - -  08/11/12 1010 128/79 mmHg - - 79  16  99 % - -  08/11/12 1008 - 96 F (35.6 C) - - - - - -  .  RECENT EKG RESULTS:    Orders placed during the hospital encounter of 08/05/12  . EKG 12-LEAD  . EKG 12-LEAD    DISCHARGE INSTRUCTIONS:    DISCHARGE MEDICATIONS:     Medication List      As of 08/12/2012  8:36 AM    TAKE these medications         acetaminophen 325 MG tablet   Commonly known as: TYLENOL   Take 650 mg by mouth every 6 (six) hours as needed. For pain      aspirin EC 81 MG tablet   Take 81 mg by mouth daily.      flunisolide 25 MCG/ACT (0.025%) Soln   Commonly known as: NASALIDE   Inhale 2 sprays into the lungs daily as needed. For congestion.      gabapentin 100 MG capsule   Commonly known as: NEURONTIN   Take 100 mg by mouth daily.      hydrochlorothiazide 25 MG tablet   Commonly known as: HYDRODIURIL   Take 12.5 mg by mouth daily.      KOREAN GINSENG 100 MG Caps   Take 100 mg by mouth daily.      loratadine 10 MG tablet   Commonly known as: CLARITIN   Take 10 mg by mouth daily as needed. For allergies      naproxen 500 MG tablet   Commonly known as: NAPROSYN   Take 500 mg by mouth 2 (two) times daily as needed. For pain      ondansetron 4 MG tablet   Commonly known as: ZOFRAN   Take 1 tablet (4 mg total) by mouth every 8 (eight) hours as needed for nausea.      oxyCODONE-acetaminophen 5-325 MG per tablet   Commonly known as: PERCOCET/ROXICET   Take 1-2 tablets by mouth every 4 (four) hours as needed for pain.      POTASSIUM PO   Take 1 tablet by mouth daily as needed. OTC.  Takes only when experiencing lethargy due to blood pressure medication.      sildenafil 100 MG tablet   Commonly known as:  VIAGRA   Take 50 mg by mouth daily as needed. For erectile dysfunction      terazosin 10 MG capsule   Commonly known as: HYTRIN   Take 10 mg by mouth at bedtime.      verapamil 240 MG CR tablet   Commonly known as: CALAN-SR   Take 240 mg by mouth at bedtime.      vitamin B-12 250 MCG tablet   Commonly known as: CYANOCOBALAMIN   Take 250 mcg by mouth daily.      Vitamin D-3 1000 UNITS Caps   Take 1,000 Units by mouth daily.        FOLLOW UP VISIT:       Follow-up Information    Follow up with SUPPLE,KEVIN M, MD. (call to be  seen in 10-14 days)    Contact information:   [address], [address] [place] [address] [phone]          DISCHARGE TO: Home   DISCHARGE CONDITION:  {Good  Crixus Mcaulay for Dr. Francena Hanly 08/12/2012, 8:36 AM

## 2012-08-12 NOTE — Plan of Care (Signed)
Problem: Phase II Progression Outcomes Goal: Discharge plan established Recommend 3 in 1 for home use and outpatient OT when appropriate per MD after acute care stay

## 2012-08-12 NOTE — Evaluation (Signed)
Occupational Therapy Evaluation Patient Details Name: Scott Zamora MRN: [id number] DOB: 1934/12/18 Today's Date: 08/12/2012 Time: 7829-5621 OT Time Calculation (min): 54 min  OT Assessment / Plan / Recommendation Clinical Impression  Pt demos decreased L UE ROM and function following L shoulder surgery. Pt wearing sling and NWB L UE. All education completed and no further acute OT services needed at this time. OT will sign off    OT Assessment  Progress rehab of shoulder as ordered by MD at follow-up appointment    Follow Up Recommendations  Outpatient OT;Other (comment) (when appropriate per MD)    Barriers to Discharge  None    Equipment Recommendations  3 in 1 bedside comode    Recommendations for Other Services    Frequency       Precautions / Restrictions Precautions Precautions: Shoulder Type of Shoulder Precautions: pendulums exercoses only L shoulder, AROM L elbow, wrist, hand. education provided for sling donning/doffing, bathing and dressnig techniques Precaution Booklet Issued: Yes (comment) Required Braces or Orthoses: Other Brace/Splint Other Brace/Splint: L UE sling to be worn at all times except during bathing, dressing and ROM Restrictions Weight Bearing Restrictions: Yes LUE Weight Bearing: Non weight bearing Other Position/Activity Restrictions: sling worn at all times except during bathing, dressing and ROM   Pertinent Vitals/Pain     ADL  Grooming: Performed;Wash/dry hands;Wash/dry face;Min guard;Set up Where Assessed - Grooming: Supported standing Upper Body Bathing: Simulated;Moderate assistance Where Assessed - Upper Body Bathing: Unsupported sitting Lower Body Bathing: Simulated;Moderate assistance Where Assessed - Lower Body Bathing: Unsupported sitting;Supported standing Upper Body Dressing: Performed;Moderate assistance Where Assessed - Upper Body Dressing: Unsupported sitting Lower Body Dressing: Performed;Moderate assistance Where Assessed  - Lower Body Dressing: Unsupported sitting;Supported sit to stand;Supported standing Toilet Transfer: Performed;Min Pension scheme manager Method: Other (comment) (ambulating with no AD) Acupuncturist: Raised toilet seat with arms (or 3-in-1 over toilet);Grab bars Toileting - Clothing Manipulation and Hygiene: Performed;Minimal assistance Where Assessed - Engineer, mining and Hygiene: Standing Equipment Used: Gait belt ADL Comments: Pt and wife provided wiht education and demo of bathing/dressing techniques, able to return demo    OT Diagnosis:    OT Problem List:   OT Treatment Interventions:     OT Goals    Visit Information  Last OT Received On: 08/12/12    Subjective Data  Subjective: " I hope to go home today " Patient Stated Goal: To return home   Prior Functioning     Home Living Lives With: Spouse Available Help at Discharge: Family;Available 24 hours/day Type of Home: House Home Access: Stairs to enter [employer] of Steps: 3 Home Layout: One level Alternate Level Stairs-Number of Steps: one step down into kitchen Bathroom Shower/Tub: Tub/shower unit;Walk-in shower Bathroom Toilet: Standard Bathroom Accessibility: Yes How Accessible: Accessible via walker Home Adaptive Equipment: Reacher;Straight cane;Walker - rolling;Long-handled shoehorn Prior Function Level of Independence: Independent Able to Take Stairs?: Yes Driving: Yes Vocation: Retired Musician: No difficulties Dominant Hand: Right         Vision/Perception Vision - Assessment Eye Alignment: Within Chemical engineer Perception: Within Functional Limits   Cognition  Overall Cognitive Status: Appears within functional limits for tasks assessed/performed Arousal/Alertness: Awake/alert Orientation Level: Appears intact for tasks assessed Behavior During Session: [facility] for tasks performed    Extremity/Trunk Assessment Right  Upper Extremity Assessment RUE ROM/Strength/Tone: [facility] for tasks assessed Left Upper Extremity Assessment LUE ROM/Strength/Tone: Due to precautions (elbow, wrist, hand WFL)     Mobility Bed Mobility Bed  Mobility: Supine to Sit;Sitting - Scoot to Edge of Bed Supine to Sit: 5: Supervision Sitting - Scoot to [place] of Bed: 5: Supervision Transfers Transfers: Sit to Stand;Stand to Sit Sit to Stand: 5: Supervision;4: Min guard Stand to Sit: 5: Supervision;4: Min guard     Shoulder Instructions Donning/doffing shirt without moving shoulder: Caregiver independent with task;Patient able to independently direct caregiver Method for sponge bathing under operated UE: Caregiver independent with task;Patient able to independently direct caregiver Donning/doffing sling/immobilizer: Caregiver independent with task;Patient able to independently direct caregiver Correct positioning of sling/immobilizer: Caregiver independent with task;Patient able to independently direct caregiver Pendulum exercises (written home exercise program): Caregiver independent with task;Patient able to independently direct caregiver ROM for elbow, wrist and digits of operated UE: Caregiver independent with task;Patient able to independently direct caregiver Sling wearing schedule (on at all times/off for ADL's): Caregiver independent with task;Patient able to independently direct caregiver Proper positioning of operated UE when showering: Caregiver independent with task;Patient able to independently direct caregiver Positioning of UE while sleeping: Caregiver independent with task;Patient able to independently direct caregiver   Exercise Shoulder Exercises Pendulum Exercise: AROM;Left;5 reps Elbow Flexion: AROM;Left;5 reps Elbow Extension: AROM;Left;5 reps Wrist Flexion: AROM;Left;5 reps Wrist Extension: AROM;Left;5 reps Digit Composite Flexion: AROM;Left;5 reps Composite Extension: AROM;Left;5 reps   Balance  Balance Balance Assessed: No   End of Session OT - End of Session Equipment Utilized During Treatment: Gait belt Activity Tolerance: Patient tolerated treatment well Patient left: in bed;with call bell/phone within reach;with family/visitor present;Other (comment) (seated EOB)  GO     Galen Manila 08/12/2012, 12:19 PM

## 2012-08-29 ENCOUNTER — Ambulatory Visit (HOSPITAL_COMMUNITY)
Admission: RE | Admit: 2012-08-29 | Discharge: 2012-08-29 | Disposition: A | Discharge status: Home / Self Care | Payer: Medicare Other | Source: Ambulatory Visit | Attending: Orthopedic Surgery | Admitting: Orthopedic Surgery

## 2012-08-29 DIAGNOSIS — M25519 Pain in unspecified shoulder: Secondary | ICD-10-CM | POA: Insufficient documentation

## 2012-08-29 DIAGNOSIS — Z96619 Presence of unspecified artificial shoulder joint: Secondary | ICD-10-CM | POA: Insufficient documentation

## 2012-08-29 DIAGNOSIS — M6281 Muscle weakness (generalized): Secondary | ICD-10-CM | POA: Insufficient documentation

## 2012-08-29 DIAGNOSIS — M19019 Primary osteoarthritis, unspecified shoulder: Secondary | ICD-10-CM

## 2012-08-29 DIAGNOSIS — M25619 Stiffness of unspecified shoulder, not elsewhere classified: Secondary | ICD-10-CM | POA: Insufficient documentation

## 2012-08-29 DIAGNOSIS — IMO0001 Reserved for inherently not codable concepts without codable children: Secondary | ICD-10-CM | POA: Insufficient documentation

## 2012-08-29 NOTE — Evaluation (Signed)
Occupational Therapy Evaluation  Patient Details  Name: Scott Zamora MRN: [id number] Date of Birth: 1935/06/08  Today's Date: 08/29/2012 Time: 1500-1540 OT Time Calculation (min): 40 min OT Eval 20' Manual Therapy 20' Visit#: 1 of 36  Re-eval: 09/26/12    Diagnosis: S/P Left Total Shoulder Replacement Surgical Date: 08/11/12 Next MD Visit: one month Prior Therapy: n/a  Authorization: [employer] Medicare  Authorization Time Period: before 10th visit  Authorization Visit#: 1 of 10   Past Medical History:  Past Medical History  Diagnosis Date  . Hypertension   . Vertigo     hx of  . Sleep apnea     has CPAP but does not wear it  . Seasonal allergies   . Arthritis   . Dry skin    Past Surgical History:  Past Surgical History  Procedure Laterality Date  . Knee arthroscopy      left knee  . Colonoscopy w/ polypectomy    . Total shoulder arthroplasty  08/11/2012    Dr Francena Hanly  . Total shoulder arthroplasty  08/11/2012    Procedure: TOTAL SHOULDER ARTHROPLASTY;  Surgeon: Senaida Lange, MD;  Location: [facility] OR;  Service: Orthopedics;  Laterality: Left;    Subjective S:  I just have one question, will I be able to swing a golf club by the end of May? Pertinent History: Scott Zamora has experienced decreased AROM, strength, increased pain that prohibited full participation in daily and desired recreational activities for several months to a year.  He consulted with Dr. Rennis Chris, and a total shoulder replacement was deemed the most effective treatment option.  Scott Zamora had surgery on 08/11/12 to replace his left shoulder.  He was hospitalized from 08/11/12-08/13/12.  He was discharged home with his shoulder in a sling.  He has been referred to occupational therapy for evlaution and treatment following Dr. Dub Mikes protocol.   Limitations: Please follow protocol.  He is currently 2 weeks post op.  PROM flexion to 90, ER to 30, IR to abdoment, Abd to 60.  Isometrics of abd, ext rot, and  elbow flexion, pulley for flexion NO RESISTED IR for 6 WEEKS.  2/20-3/13  Initiate AAROM, progress flexion to 90-100, ER scapular plane 30, IR scapular plane at 45 to side, AAROM ER/IR in supine with Dowel.  Rhythmic Stabilization in supine for flexion/ext, ER/IR in scapular plane, isometrics ER, FLex, Ext , ABd, pulley exercises.  Please see scanned protocol for exercises beyond 03/13 Special Tests: DASH scored 79.5 with 0 being ideal score Patient Stated Goals: I am very active, I want to get back to my normal activity level.   Pain Assessment Currently in Pain?: Yes Pain Score:   2 Pain Location: Shoulder Pain Orientation: Left Pain Type: Acute pain  Precautions/Restrictions  Precautions Precautions: Shoulder Precaution Comments: see protocol  Prior Functioning  Home Living Lives With: Spouse Available Help at Discharge: Family;Available 24 hours/day Type of Home: House Home Access: Stairs to enter [employer] of Steps: 3 Home Layout: One level Alternate Level Stairs-Number of Steps: one step down into kitchen Bathroom Shower/Tub: Tub/shower unit;Walk-in shower Bathroom Toilet: Standard Bathroom Accessibility: Yes How Accessible: Accessible via walker Home Adaptive Equipment: Reacher;Straight cane;Walker - rolling;Long-handled shoehorn Prior Function Able to Take Stairs?: Yes Driving: Yes Vocation: Retired Leisure: Hobbies-yes (Comment) Comments: enjoys golfing, gardening, bowling  Assessment ADL/Vision/Perception ADL ADL Comments: Patient is not utilizing his left arm with any functional activities.  He is able to dress himself and sponge bathes Ily.  Dominant Hand: Right Vision - History Baseline Vision: Wears glasses all the time  Cognition/Observation Cognition Overall Cognitive Status: Appears within functional limits for tasks assessed Observation/Other Assessments Observations: 14.5 healing surgical incision over anterior shoulder with sterstrips  intact  Sensation/Coordination/Edema Sensation Light Touch: Appears Intact Coordination Gross Motor Movements are Fluid and Coordinated: Yes Fine Motor Movements are Fluid and Coordinated: Yes Edema Edema:  (trace - minimal.  Resolving independently)  Additional Assessments LUE AROM (degrees) LUE Overall AROM Comments: AROM not assessed per protocol LUE PROM (degrees) LUE Overall PROM Comments: assessed in supine, ER/IR with shoulder adducted  Left Shoulder Flexion: 70 Degrees Left Shoulder ABduction: 50 Degrees Left Shoulder Internal Rotation: 80 Degrees Left Shoulder External Rotation: 15 Degrees LUE Strength LUE Overall Strength Comments: strength not assessed, per protocol Palpation Palpation: max fascial and scar restrictions noted in region of surgical incision, scapular region, pectoral region, shoulder joint     Exercise/Treatments    Manual Therapy Manual Therapy: Myofascial release Myofascial Release: MFR and manual stretching to left anterior shoulder, posterior shoulder, lateral arm, pectoral, and scapular region to decrease pain and restrictions and increase pain free mobiility, within parameters of protocol.  Occupational Therapy Assessment and Plan OT Assessment and Plan Clinical Impression Statement: A:  78 year old male s/p left total shoulder replacement on 08/11/12.  Patient presents with increased pian and fascial restrictions and decreased AROM/PROM and strength.  These deficts are causing decreased use of LUE and decreased participation in all daily activities. Pt will benefit from skilled therapeutic intervention in order to improve on the following deficits: Decreased range of motion;Decreased strength;Increased fascial restricitons;Increased muscle spasms;Pain Rehab Potential: Good OT Frequency: Min 3X/week OT Duration: 12 weeks OT Treatment/Interventions: Therapeutic exercise;Neuromuscular education;Modalities;Manual therapy;Self-care/ADL  training;Therapeutic activities;Patient/family education OT Plan: P:  Skilled OT intervention to decrease pain and fascial restrictions and increase pain free mobility and strength within parameters of protocol for return to prior level of I with all B/IADLs and leisure activites.  Treatment Plan:  MFR and manual stretching, scar release when surgical incision is completely healed.  PROM following protocol, isometrics, ball stretches, elev, ext, row, elbow- hand AROM, progress per indication on protocol.   Goals Short Term Goals Time to Complete Short Term Goals: 6 weeks Short Term Goal 1: Patient will be educated on a HEP. Short Term Goal 2: Patient will increase PROM to [facility] in LUE for increased independence with dressing and bathing. Short Term Goal 3: Patient will increase strength in his left shoulder to 3+/5 for increased ability to lift gardening tools. Short Term Goal 4: Patient will decrease pain level to 2/10 with active reaching in his left shoulder. Short Term Goal 5: Patient will decrease fascial restrictions from max to moderate in his left shoulder region.  Long Term Goals Time to Complete Long Term Goals: 12 weeks Long Term Goal 1: Patient will return to prior level of I with all B/IADLs and leisure activities.  Long Term Goal 2: Patient will increase his left shoulder AROM to [facility] for increased ability to return to golf and bowling.  Long Term Goal 3: Patient will increase his left shoulder strength to 4+/5 for increased abiilty lift his golf bag. Long Term Goal 4: Patient will decrease his left shoulder pain level to 1/10 or better when reaching overhead. Long Term Goal 5: Patient will decrease his left shoulder restrictions to minimal for increased mobility and greater participation in desired activities.   Problem List Patient Active Problem List  Diagnosis  .  Arthritis of shoulder  . S/P shoulder replacement  . Pain in joint, shoulder region  . Muscle weakness  (generalized)    End of Session Activity Tolerance: Patient tolerated treatment well General Behavior During Session: [facility] for tasks performed Cognition: [facility] for tasks performed OT Plan of Care OT Home Exercise Plan: Educated on pendulums, towel slides, tendon glides. Consulted and Agree with Plan of Care: Patient  GO Functional Assessment Tool Used: DASH scored 79.5 with ideal score being 0. Functional Limitation: Carrying, moving and handling objects Carrying, Moving and Handling Objects Current Status [phone]): At least 60 percent but less than 80 percent impaired, limited or restricted Carrying, Moving and Handling Objects Goal Status [phone]): At least 1 percent but less than 20 percent impaired, limited or restricted  Shirlean Mylar, OTR/L  08/29/2012, 5:10 PM  Physician Documentation Your signature is required to indicate approval of the treatment plan as stated above.  Please sign and either send electronically or make a copy of this report for your files and return this physician signed original.  Please mark one 1.__approve of plan  2. ___approve of plan with the following conditions.   ______________________________                                                          _____________________ Physician Signature                                                                                                             Date

## 2012-08-31 ENCOUNTER — Ambulatory Visit (HOSPITAL_COMMUNITY)
Admission: RE | Admit: 2012-08-31 | Discharge: 2012-08-31 | Disposition: A | Discharge status: Home / Self Care | Payer: Medicare Other | Source: Ambulatory Visit | Attending: Orthopedic Surgery | Admitting: Orthopedic Surgery

## 2012-08-31 NOTE — Progress Notes (Signed)
Occupational Therapy Treatment Patient Details  Name: Scott Zamora MRN: [id number] Date of Birth: 1935-04-19  Today's Date: 08/31/2012 Time: 6962-9528 OT Time Calculation (min): 37 min MFR 4132-4401 12' Therex 0272-5366 25'  Visit#: 2 of 36  Re-eval: 09/26/12    Authorization: [employer]  Authorization Time Period: before 10th visit  Authorization Visit#: 2 of 10  Subjective Symptoms/Limitations Symptoms: S: I've been working on the exercises at home. I try to not wear my sling around the house for a little bit. I always wear it when I go out. Pain Assessment Currently in Pain?: No/denies  Precautions/Restrictions  Precautions Precautions: Shoulder Precaution Comments: see protocol  Exercise/Treatments Supine Horizontal ABduction: PROM;10 reps External Rotation: PROM;10 reps Internal Rotation: PROM;10 reps Flexion: PROM;10 reps ABduction: PROM;10 reps Seated Elevation: AROM;10 reps Extension: AROM;10 reps Row: AROM;10 reps Therapy Ball Flexion: 10 reps ROM / Strengthening / Isometric Strengthening   Flexion: 3X3" Extension: 3X3" External Rotation: 3X3" Internal Rotation: 3X3" ABduction: 3X3" ADduction: 3X3"     Manual Therapy Manual Therapy: Myofascial release Myofascial Release: MFR and manual stretching to left anterior shoulder, posterior shoulder, lateral arm, pectoral, and scapular region to decrease pain and restrictions and increase pain free mobiility, within parameters of protocol.  Occupational Therapy Assessment and Plan OT Assessment and Plan Clinical Impression Statement: A: Increase tightness during PROM with pain at end of stretch. Patient tolerated exercises overall. Reviewed protocol and discussed what he is allowed to do and what he's not. Pt will benefit from skilled therapeutic intervention in order to improve on the following deficits: Decreased range of motion;Decreased strength;Increased fascial restricitons;Increased muscle  spasms;Pain OT Plan: P: cont. to work on increasing PROM in a pain free zone and decrease restrictions to increase pain free mobility.   Goals Short Term Goals Time to Complete Short Term Goals: 6 weeks Short Term Goal 1: Patient will be educated on a HEP. Short Term Goal 1 Progress: Progressing toward goal Short Term Goal 2: Patient will increase PROM to [facility] in [place] for increased independence with dressing and bathing. Short Term Goal 2 Progress: Progressing toward goal Short Term Goal 3: Patient will increase strength in his left shoulder to 3+/5 for increased ability to lift gardening tools. Short Term Goal 3 Progress: Progressing toward goal Short Term Goal 4: Patient will decrease pain level to 2/10 with active reaching in his left shoulder. Short Term Goal 4 Progress: Progressing toward goal Short Term Goal 5: Patient will decrease fascial restrictions from max to moderate in his left shoulder region.  Short Term Goal 5 Progress: Progressing toward goal Long Term Goals Time to Complete Long Term Goals: 12 weeks Long Term Goal 1: Patient will return to prior level of I with all B/IADLs and leisure activities.  Long Term Goal 1 Progress: Progressing toward goal Long Term Goal 2: Patient will increase his left shoulder AROM to [facility] for increased ability to return to golf and bowling.  Long Term Goal 2 Progress: Progressing toward goal Long Term Goal 3: Patient will increase his left shoulder strength to 4+/5 for increased abiilty lift his golf bag. Long Term Goal 3 Progress: Progressing toward goal Long Term Goal 4: Patient will decrease his left shoulder pain level to 1/10 or better when reaching overhead. Long Term Goal 4 Progress: Progressing toward goal Long Term Goal 5: Patient will decrease his left shoulder restrictions to minimal for increased mobility and greater participation in desired activities.  Long Term Goal 5 Progress: Progressing toward goal  Problem  List Patient  Active Problem List  Diagnosis  . Arthritis of shoulder  . S/P shoulder replacement  . Pain in joint, shoulder region  . Muscle weakness (generalized)    End of Session Activity Tolerance: Patient tolerated treatment well General Behavior During Session: [facility] for tasks performed Cognition: [facility] for tasks performed  [name], OTR/L  08/31/2012, 3:20 PM

## 2012-09-02 ENCOUNTER — Ambulatory Visit (HOSPITAL_COMMUNITY)
Admission: RE | Admit: 2012-09-02 | Discharge: 2012-09-02 | Disposition: A | Discharge status: Home / Self Care | Payer: Medicare Other | Source: Ambulatory Visit | Attending: Orthopedic Surgery | Admitting: Orthopedic Surgery

## 2012-09-02 DIAGNOSIS — M25519 Pain in unspecified shoulder: Secondary | ICD-10-CM

## 2012-09-02 DIAGNOSIS — M6281 Muscle weakness (generalized): Secondary | ICD-10-CM

## 2012-09-02 DIAGNOSIS — Z96619 Presence of unspecified artificial shoulder joint: Secondary | ICD-10-CM

## 2012-09-02 NOTE — Progress Notes (Signed)
Occupational Therapy Treatment Patient Details  Name: Scott Zamora MRN: [id number] Date of Birth: 10/27/1934  Today's Date: 09/02/2012 Time: 4098-1191 OT Time Calculation (min): 46 min Manual Therapy [id number] 23' Therapeutic Exercises [phone] 23' Visit#: 3 of 36  Re-eval: 09/26/12    Authorization: [employer]  Authorization Time Period: before 10th visit  Authorization Visit#: 3 of 10  Subjective Symptoms/Limitations Symptoms: S:  My back has been hurting and Im not quite sure why. Limitations: Please follow protocol.  He is currently 2 weeks post op.  PROM flexion to 90, ER to 30, IR to abdoment, Abd to 60.  Isometrics of abd, ext rot, and elbow flexion, pulley for flexion NO RESISTED IR for 6 WEEKS.  2/20-3/13  Initiate AAROM, progress flexion to 90-100, ER scapular plane 30, IR scapular plane at 45 to side, AAROM ER/IR in supine with Dowel.  Rhythmic Stabilization in supine for flexion/ext, ER/IR in scapular plane, isometrics ER, FLex, Ext , ABd, pulley exercises.  Please see scanned protocol for exercises beyond 03/13 Pain Assessment Currently in Pain?: Yes Pain Score:   2 Pain Location: Shoulder Pain Orientation: Left Pain Type: Acute pain  Precautions/Restrictions    Please follow protocol.  He is currently 2 weeks post op.  PROM flexion to 90, ER to 30, IR to abdoment, Abd to 60.  Isometrics of abd, ext rot, and elbow flexion, pulley for flexion NO RESISTED IR for 6 WEEKS.  2/20-3/13  Initiate AAROM, progress flexion to 90-100, ER scapular plane 30, IR scapular plane at 45 to side, AAROM ER/IR in supine with Dowel.  Rhythmic Stabilization in supine for flexion/ext, ER/IR in scapular plane, isometrics ER, FLex, Ext , ABd, pulley exercises.  Please see scanned protocol for exercises beyond 03/13   Exercise/Treatments Supine Protraction: PROM;10 reps Horizontal ABduction: PROM;10 reps External Rotation: PROM;10 reps Internal Rotation: PROM;10 reps Flexion: PROM;10  reps ABduction: PROM;10 reps Other Supine Exercises: bridging x 15  Other Supine Exercises: serratus anterior puch wtih faciliation from OTR/L  Seated Elevation: AROM;10 reps Extension: AROM;10 reps Row: AROM;10 reps Therapy Ball Flexion: 15 reps ABduction: 15 reps (on small green therapy ball) ROM / Strengthening / Isometric Strengthening   Flexion: 3X5" Extension: 3X5" External Rotation: 3X5" Internal Rotation: 3X5" ABduction: 3X5" ADduction: 3X5"    Manual Therapy Manual Therapy: Myofascial release Myofascial Release: MFR and manual stretching to left anterior shoulder, posterior shoulder, lateral arm, pectoral, and scapular region to decrease pain and restrictions and increase pain free mobiility, within parameters of protocol.[id number]  Occupational Therapy Assessment and Plan OT Assessment and Plan Clinical Impression Statement: A:  Increased external rotation PROM.  Required max vg with ball stretches to depress shoulder.  transitioned from large blue therapy ball to green therapy ball for stretches into abduction for less pain and less compensatory shoulder movements. OT Plan: P:  Resume ball stretches with large blue therapy ball as patients PROM and mobility improves.  Increase to 5 x 5" with isometric strengthening.    Goals Short Term Goals Time to Complete Short Term Goals: 6 weeks Short Term Goal 1: Patient will be educated on a HEP. Short Term Goal 2: Patient will increase PROM to [facility] in [place] for increased independence with dressing and bathing. Short Term Goal 3: Patient will increase strength in his left shoulder to 3+/5 for increased ability to lift gardening tools. Short Term Goal 4: Patient will decrease pain level to 2/10 with active reaching in his left shoulder. Short Term Goal 5: Patient will decrease  fascial restrictions from max to moderate in his left shoulder region.  Long Term Goals Time to Complete Long Term Goals: 12 weeks Long Term Goal 1: Patient  will return to prior level of I with all B/IADLs and leisure activities.  Long Term Goal 2: Patient will increase his left shoulder AROM to [facility] for increased ability to return to golf and bowling.  Long Term Goal 3: Patient will increase his left shoulder strength to 4+/5 for increased abiilty lift his golf bag. Long Term Goal 4: Patient will decrease his left shoulder pain level to 1/10 or better when reaching overhead. Long Term Goal 5: Patient will decrease his left shoulder restrictions to minimal for increased mobility and greater participation in desired activities.   Problem List Patient Active Problem List  Diagnosis  . Arthritis of shoulder  . S/P shoulder replacement  . Pain in joint, shoulder region  . Muscle weakness (generalized)    End of Session Activity Tolerance: Patient tolerated treatment well General Behavior During Session: [facility] for tasks performed Cognition: [facility] for tasks performed   Shirlean Mylar, OTR/L  09/02/2012, 3:24 PM

## 2012-09-05 ENCOUNTER — Ambulatory Visit (HOSPITAL_COMMUNITY)
Admission: RE | Admit: 2012-09-05 | Discharge: 2012-09-05 | Disposition: A | Discharge status: Home / Self Care | Payer: Medicare Other | Source: Ambulatory Visit | Attending: Orthopedic Surgery | Admitting: Orthopedic Surgery

## 2012-09-05 DIAGNOSIS — M25512 Pain in left shoulder: Secondary | ICD-10-CM

## 2012-09-05 DIAGNOSIS — M6281 Muscle weakness (generalized): Secondary | ICD-10-CM

## 2012-09-05 DIAGNOSIS — Z96612 Presence of left artificial shoulder joint: Secondary | ICD-10-CM

## 2012-09-05 NOTE — Progress Notes (Signed)
Occupational Therapy Treatment Patient Details  Name: Scott Zamora MRN: [id number] Date of Birth: [birth year]  Today's Date: 09/05/2012 Time: 1025-1105 OT Time Calculation (min): 40 min Manual Therapy 17' Therapeutic Exercises 23' Visit#: 4 of 36  Re-eval: 09/26/12    Authorization: [employer]  Authorization Time Period: before 10th visit  Authorization Visit#: 4 of 10  Subjective Symptoms/Limitations Symptoms: S:  I accidentally pushed up from the table with my left arm yesterday.  It didnt feel so good. Pertinent History: Scott Zamora has experienced decreased AROM, strength, increased pain that prohibited full participation in daily and desired recreational activities for several months to a year.  He consulted with Dr. Rennis Chris, and a total shoulder replacement was deemed the most effective treatment option.  Scott Zamora had surgery on 08/11/12 to replace his left shoulder.  He was hospitalized from 08/11/12-08/13/12.  He was discharged home with his shoulder in a sling.  He has been referred to occupational therapy for evlaution and treatment following Dr. Dub Mikes protocol.   Limitations: Please follow protocol.  He is currently 2 weeks post op.  PROM flexion to 90, ER to 30, IR to abdoment, Abd to 60.  Isometrics of abd, ext rot, and elbow flexion, pulley for flexion NO RESISTED IR for 6 WEEKS.  2/20-3/13  Initiate AAROM, progress flexion to 90-100, ER scapular plane 30, IR scapular plane at 45 to side, AAROM ER/IR in supine with Dowel.  Rhythmic Stabilization in supine for flexion/ext, ER/IR in scapular plane, isometrics ER, FLex, Ext , ABd, pulley exercises.  Please see scanned protocol for exercises beyond 03/13 Pain Assessment Currently in Pain?: Yes Pain Score:   3  Precautions/Restrictions  Please follow protocol.  He is currently 2 weeks post op.  PROM flexion to 90, ER to 30, IR to abdoment, Abd to 60.  Isometrics of abd, ext rot, and elbow flexion, pulley for flexion NO  RESISTED IR for 6 WEEKS.  2/20-3/13  Initiate AAROM, progress flexion to 90-100, ER scapular plane 30, IR scapular plane at 45 to side, AAROM ER/IR in supine with Dowel.  Rhythmic Stabilization in supine for flexion/ext, ER/IR in scapular plane, isometrics ER, FLex, Ext , ABd, pulley exercises.  Please see scanned protocol for exercises beyond 03/13     Exercise/Treatments Supine Protraction: PROM;10 reps Horizontal ABduction: PROM;10 reps External Rotation: PROM;10 reps Internal Rotation: PROM;10 reps Flexion: PROM;10 reps ABduction: PROM;10 reps Other Supine Exercises: bridging x 20 Other Supine Exercises: serratus anterior puch wtih faciliation from OTR/L  Seated Elevation: AROM;10 reps Extension: AROM;10 reps Row: AROM;10 reps Pulleys Flexion: 1 minute;Limitations Flexion Limitations: with max facilitation ABduction: 1 minute;Limitations ABduction Limitations: with max facilitation Therapy Ball Flexion: 20 reps (smaller green therapy ball) ABduction: 20 reps (smaller green therapy ball) ROM / Strengthening / Isometric Strengthening Other ROM/Strengthening Exercises: supine AROM elbow flexion/extension, supination/pronation, wrist flexion/extension x 10 reps Flexion: Supine;5X5" Extension: Supine;5X5" External Rotation: Supine;5X5" Internal Rotation: Supine;5X5" ABduction: Supine;5X5" ADduction: Supine;5X5"    Manual Therapy Manual Therapy: Myofascial release Myofascial Release: MFR and manual stretching to left anterior shoulder, posterior shoulder, lateral arm, pectoral, and scapular region to decrease pain and restrictions and increase pain free mobiility, within parameters of protocol.  Occupational Therapy Assessment and Plan OT Assessment and Plan Clinical Impression Statement: A:  Increased stiffness with PROM through flexion and abduction.  External rotation has increased PROM.  Increased to 5" contraction with isometrics.  Patients protocol allows for AAROM,  however, PROM requires improvement prior to begininning.  OT Plan:  P:  Resume ball stretches with large blue therapy ball.  Improve PROM to parameters of protocol.   Goals Short Term Goals Time to Complete Short Term Goals: 6 weeks Short Term Goal 1: Patient will be educated on a HEP. Short Term Goal 1 Progress: Progressing toward goal Short Term Goal 2: Patient will increase PROM to [facility] in [place] for increased independence with dressing and bathing. Short Term Goal 2 Progress: Progressing toward goal Short Term Goal 3: Patient will increase strength in his left shoulder to 3+/5 for increased ability to lift gardening tools. Short Term Goal 3 Progress: Progressing toward goal Short Term Goal 4: Patient will decrease pain level to 2/10 with active reaching in his left shoulder. Short Term Goal 4 Progress: Progressing toward goal Short Term Goal 5: Patient will decrease fascial restrictions from max to moderate in his left shoulder region.  Short Term Goal 5 Progress: Progressing toward goal Long Term Goals Time to Complete Long Term Goals: 12 weeks Long Term Goal 1: Patient will return to prior level of I with all B/IADLs and leisure activities.  Long Term Goal 1 Progress: Progressing toward goal Long Term Goal 2: Patient will increase his left shoulder AROM to [facility] for increased ability to return to golf and bowling.  Long Term Goal 2 Progress: Progressing toward goal Long Term Goal 3: Patient will increase his left shoulder strength to 4+/5 for increased abiilty lift his golf bag. Long Term Goal 3 Progress: Progressing toward goal Long Term Goal 4: Patient will decrease his left shoulder pain level to 1/10 or better when reaching overhead. Long Term Goal 4 Progress: Progressing toward goal Long Term Goal 5: Patient will decrease his left shoulder restrictions to minimal for increased mobility and greater participation in desired activities.  Long Term Goal 5 Progress: Progressing toward  goal  Problem List Patient Active Problem List  Diagnosis  . Arthritis of shoulder  . S/P shoulder replacement  . Pain in joint, shoulder region  . Muscle weakness (generalized)    End of Session Activity Tolerance: Patient tolerated treatment well General Behavior During Session: [facility] for tasks performed Cognition: [facility] for tasks performed   Shirlean Mylar, OTR/L  09/05/2012, 11:13 AM

## 2012-09-07 ENCOUNTER — Ambulatory Visit (HOSPITAL_COMMUNITY)
Admission: RE | Admit: 2012-09-07 | Discharge: 2012-09-07 | Disposition: A | Discharge status: Home / Self Care | Payer: Medicare Other | Source: Ambulatory Visit | Attending: Pulmonary Disease | Admitting: Pulmonary Disease

## 2012-09-07 DIAGNOSIS — M6281 Muscle weakness (generalized): Secondary | ICD-10-CM

## 2012-09-07 DIAGNOSIS — M25512 Pain in left shoulder: Secondary | ICD-10-CM

## 2012-09-07 DIAGNOSIS — Z96612 Presence of left artificial shoulder joint: Secondary | ICD-10-CM

## 2012-09-07 NOTE — Progress Notes (Signed)
Occupational Therapy Treatment Patient Details  Name: Scott Zamora MRN: [id number] Date of Birth: August 25, 1934  Today's Date: 09/07/2012 Time: 4098-1191 OT Time Calculation (min): 45 min Manual Therapy 1400-1425 25' Therapeutic Exercises 4782-9562 [phone] no charge) 10' Visit#: 5 of 36  Re-eval: 09/26/12    Authorization: [facility] Medicare  Authorization Time Period: before 10th visit  Authorization Visit#: 5 of 10  Subjective Symptoms/Limitations Symptoms: S: It feels so good sometimes I forget that it was hurt and I want to use it. Limitations: Please follow protocol.  NO RESISTED IR for 6 WEEKS.  2/20-3/13  Initiate AAROM, progress flexion to 90-100, ER scapular plane 30, IR scapular plane at 45 to side, AAROM ER/IR in supine with Dowel.  Rhythmic Stabilization in supine for flexion/ext, ER/IR in scapular plane, isometrics ER, FLex, Ext , ABd, pulley exercises.  Please see scanned protocol for exercises beyond 03/13 Pain Assessment Currently in Pain?: Yes Pain Score:   2 Pain Location: Shoulder Pain Orientation: Left Pain Type: Acute pain  Precautions/Restrictions    Please follow protocol.  NO RESISTED IR for 6 WEEKS.  2/20-3/13  Initiate AAROM, progress flexion to 90-100, ER scapular plane 30, IR scapular plane at 45 to side, AAROM ER/IR in supine with Dowel.  Rhythmic Stabilization in supine for flexion/ext, ER/IR in scapular plane, isometrics ER, FLex, Ext , ABd, pulley exercises.  Please see scanned protocol for exercises beyond 03/13  Exercise/Treatments Supine Protraction: PROM;10 reps;AAROM;Limitations Protraction Limitations: hand over hand facilitation and dowel rod utilized for AAROM Horizontal ABduction: PROM;10 reps;AAROM;5 reps;Limitations Horizontal ABduction Limitations: hand over hand facilitation and dowel rod with AAROM External Rotation: PROM;10 reps;AAROM;5 reps;Limitations External Rotation Limitations: hand over hand facilitation and dowel utiilzed with  AAROM Internal Rotation: PROM;10 reps;AAROM;5 reps;Limitations Internal Rotation Limitations: hand over hand facilitation and dowel utilized for AAROM Flexion: PROM;10 reps;AAROM;5 reps;Limitations Flexion Limitations: hand over hand facilitation and dowel utilized with AAROM ABduction: PROM;10 reps Other Supine Exercises: bridging x 20 Seated Elevation: AROM;15 reps Extension: AROM;15 reps Row: AROM;15 reps Pulleys Flexion: 1 minute;Limitations Flexion Limitations: with max facilitation ABduction: 1 minute;Limitations ABduction Limitations: with max facilitation Therapy Ball Flexion: 20 reps (large blue therapy ball) ABduction: 20 reps (large blue therapy ball) ROM / Strengthening / Isometric Strengthening Prot/Ret//Elev/Dep: 1' Flexion: Supine;5X5" Extension: Supine;5X5" External Rotation: Supine;5X5" Internal Rotation: Supine;5X5" ABduction: Supine;5X5" ADduction: Supine;5X5"    Manual Therapy Manual Therapy: Myofascial release Myofascial Release: MFR and manual stretching to left anterior shoulder, posterior shoulder, lateral arm, pectoral, and scapular region to decrease pain and restrictions and increase pain free mobiility, within parameters of protocol.  Occupational Therapy Assessment and Plan OT Assessment and Plan Clinical Impression Statement: A:  Increased all PROM this date.  Flexion 90, abduction 90, external rotation with shoulder adducted 30.  Able to complete AAROM in supine with hand over hand facilitation from therapist.  Able to complete ball stretches with larger ball, as PROM has improved.  OT Plan: P:  Increase I with AAROM exercises, following protocol.   Goals Short Term Goals Time to Complete Short Term Goals: 6 weeks Short Term Goal 1: Patient will be educated on a HEP. Short Term Goal 2: Patient will increase PROM to [facility] in [place] for increased independence with dressing and bathing. Short Term Goal 3: Patient will increase strength in his left  shoulder to 3+/5 for increased ability to lift gardening tools. Short Term Goal 4: Patient will decrease pain level to 2/10 with active reaching in his left shoulder. Short Term Goal 5:  Patient will decrease fascial restrictions from max to moderate in his left shoulder region.  Long Term Goals Time to Complete Long Term Goals: 12 weeks Long Term Goal 1: Patient will return to prior level of I with all B/IADLs and leisure activities.  Long Term Goal 2: Patient will increase his left shoulder AROM to [facility] for increased ability to return to golf and bowling.  Long Term Goal 3: Patient will increase his left shoulder strength to 4+/5 for increased abiilty lift his golf bag. Long Term Goal 4: Patient will decrease his left shoulder pain level to 1/10 or better when reaching overhead. Long Term Goal 5: Patient will decrease his left shoulder restrictions to minimal for increased mobility and greater participation in desired activities.   Problem List Patient Active Problem List  Diagnosis  . Arthritis of shoulder  . S/P shoulder replacement  . Pain in joint, shoulder region  . Muscle weakness (generalized)    End of Session Activity Tolerance: Patient tolerated treatment well General Behavior During Session: [facility] for tasks performed Cognition: [facility] for tasks performed   Shirlean Mylar, OTR/L  09/07/2012, 3:16 PM

## 2012-09-09 ENCOUNTER — Ambulatory Visit (HOSPITAL_COMMUNITY)
Admission: RE | Admit: 2012-09-09 | Discharge: 2012-09-09 | Disposition: A | Discharge status: Home / Self Care | Payer: Medicare Other | Source: Ambulatory Visit | Attending: Orthopedic Surgery | Admitting: Orthopedic Surgery

## 2012-09-09 DIAGNOSIS — Z96612 Presence of left artificial shoulder joint: Secondary | ICD-10-CM

## 2012-09-09 DIAGNOSIS — M6281 Muscle weakness (generalized): Secondary | ICD-10-CM

## 2012-09-09 DIAGNOSIS — M25512 Pain in left shoulder: Secondary | ICD-10-CM

## 2012-09-09 NOTE — Progress Notes (Signed)
Occupational Therapy Treatment Patient Details  Name: Scott Zamora MRN: [id number] Date of Birth: [birth year]  Today's Date: 09/09/2012 Time: 1435-1530 OT Time Calculation (min): 55 min MFR 1435-1450 15' Therex 1450-1530 40  Visit#: 6 of 36  Re-eval: 09/26/12    Authorization: [employer]  Authorization Time Period: before 10th visit  Authorization Visit#: 6 of 10  Subjective Symptoms/Limitations Symptoms: S: My arm wants to help too much some times. Pain Assessment Currently in Pain?: No/denies  Precautions/Restrictions  Precautions Precautions: Shoulder Precaution Comments: see protocol  Exercise/Treatments Supine Protraction: PROM;AAROM;10 reps Horizontal ABduction: PROM;AAROM;10 reps External Rotation: PROM;AAROM;10 reps Internal Rotation: PROM;AAROM;10 reps Flexion: PROM;AAROM;10 reps ABduction: PROM;AAROM;10 reps Other Supine Exercises: time Other Supine Exercises: time Seated Elevation: AROM;15 reps Extension: AROM;15 reps Row: AROM;15 reps Pulleys Flexion: 1 minute ABduction: 1 minute Therapy Ball Flexion: 20 reps (large blue ball) ABduction: 20 reps (large blue ball) ROM / Strengthening / Isometric Strengthening Other ROM/Strengthening Exercises: time      Manual Therapy Manual Therapy: Myofascial release Myofascial Release: MFR and manual stretching to left anterior shoulder, posterior shoulder, lateral arm, pectoral, and scapular region to decrease pain and restrictions and increase pain free mobiility, within parameters of protocol.  Occupational Therapy Assessment and Plan OT Assessment and Plan Clinical Impression Statement: A: Able to complete AAROM in supine without hand over hand facilitation. No facilitation needed during pulleys. Only needed initial set up of arms before beginning. OT Plan: P:  Increase I with AAROM exercises, follwing protocol. Less verbal cues.   Goals Short Term Goals Time to Complete Short Term Goals: 6  weeks Short Term Goal 1: Patient will be educated on a HEP. Short Term Goal 2: Patient will increase PROM to [facility] in [place] for increased independence with dressing and bathing. Short Term Goal 3: Patient will increase strength in his left shoulder to 3+/5 for increased ability to lift gardening tools. Short Term Goal 4: Patient will decrease pain level to 2/10 with active reaching in his left shoulder. Short Term Goal 5: Patient will decrease fascial restrictions from max to moderate in his left shoulder region.  Long Term Goals Time to Complete Long Term Goals: 12 weeks Long Term Goal 1: Patient will return to prior level of I with all B/IADLs and leisure activities.  Long Term Goal 2: Patient will increase his left shoulder AROM to [facility] for increased ability to return to golf and bowling.  Long Term Goal 3: Patient will increase his left shoulder strength to 4+/5 for increased abiilty lift his golf bag. Long Term Goal 4: Patient will decrease his left shoulder pain level to 1/10 or better when reaching overhead. Long Term Goal 5: Patient will decrease his left shoulder restrictions to minimal for increased mobility and greater participation in desired activities.   Problem List Patient Active Problem List  Diagnosis  . Arthritis of shoulder  . S/P shoulder replacement  . Pain in joint, shoulder region  . Muscle weakness (generalized)    End of Session Activity Tolerance: Patient tolerated treatment well General Behavior During Session: [facility] for tasks performed Cognition: [facility] for tasks performed   [name], OTR/L  09/09/2012, 4:13 PM

## 2012-09-12 ENCOUNTER — Ambulatory Visit (HOSPITAL_COMMUNITY): Payer: Medicare Other

## 2012-09-14 ENCOUNTER — Ambulatory Visit (HOSPITAL_COMMUNITY)
Admission: RE | Admit: 2012-09-14 | Discharge: 2012-09-14 | Disposition: A | Discharge status: Home / Self Care | Payer: Medicare Other | Source: Ambulatory Visit | Attending: Pulmonary Disease | Admitting: Pulmonary Disease

## 2012-09-14 DIAGNOSIS — Z96612 Presence of left artificial shoulder joint: Secondary | ICD-10-CM

## 2012-09-14 DIAGNOSIS — M25619 Stiffness of unspecified shoulder, not elsewhere classified: Secondary | ICD-10-CM | POA: Insufficient documentation

## 2012-09-14 DIAGNOSIS — M6281 Muscle weakness (generalized): Secondary | ICD-10-CM

## 2012-09-14 DIAGNOSIS — IMO0001 Reserved for inherently not codable concepts without codable children: Secondary | ICD-10-CM | POA: Insufficient documentation

## 2012-09-14 DIAGNOSIS — M25519 Pain in unspecified shoulder: Secondary | ICD-10-CM | POA: Insufficient documentation

## 2012-09-14 DIAGNOSIS — M25512 Pain in left shoulder: Secondary | ICD-10-CM

## 2012-09-14 NOTE — Progress Notes (Signed)
Occupational Therapy Treatment Patient Details  Name: Scott Zamora MRN: [id number] Date of Birth: 06-03-1935  Today's Date: 09/14/2012 Time: 6962-9528 OT Time Calculation (min): 44 min Manual Therapy 4132-4401 16' Therapeutic Exercises [id number] 28' Visit#: 7 of 36  Re-eval: 09/26/12    Authorization: [employer]  Authorization Time Period: before 10th visit  Authorization Visit#: 7 of 10  Subjective S:  I hope I can get rid of this sling. Limitations: Please follow protocol.  NO RESISTED IR for 6 WEEKS.  2/20-3/13  Initiate AAROM, progress flexion to 90-100, ER scapular plane 30, IR scapular plane at 45 to side, AAROM ER/IR in supine with Dowel.  Rhythmic Stabilization in supine for flexion/ext, ER/IR in scapular plane, isometrics ER, FLex, Ext , ABd, pulley exercises.  Please see scanned protocol for exercises beyond 03/13 Pain Assessment Currently in Pain?: No/denies Pain Score: 0-No pain  Precautions/Restrictions     Please follow protocol.  NO RESISTED IR for 6 WEEKS.  2/20-3/13  Initiate AAROM, progress flexion to 90-100, ER scapular plane 30, IR scapular plane at 45 to side, AAROM ER/IR in supine with Dowel.  Rhythmic Stabilization in supine for flexion/ext, ER/IR in scapular plane, isometrics ER, FLex, Ext , ABd, pulley exercises.  Please see scanned protocol for exercises beyond 03/13   Exercise/Treatments Supine Protraction: PROM;AAROM;10 reps Horizontal ABduction: PROM;AAROM;10 reps External Rotation: PROM;AAROM;10 reps Internal Rotation: PROM;AAROM;10 reps Flexion: PROM;AAROM;10 reps ABduction: PROM;AAROM;10 reps Other Supine Exercises: dc Other Supine Exercises: dc Seated Elevation: AROM;15 reps Extension: AROM;15 reps Row: AROM;15 reps Prone    Sidelying   Standing   Pulleys Flexion: 2 minutes ABduction: 1 minute;Limitations ABduction Limitations: attempted 2', and patient unable to complete due to arm fatigue.  Requires mod vg to relax scapular  region when completing pulley exercises  Therapy Ball Flexion: 20 reps ABduction: 20 reps ROM / Strengthening / Isometric Strengthening Proximal Shoulder Strengthening, Supine: 10 reps with facilitation/AAROM  Rhythmic Stabilization, Supine: 15"      Manual Therapy Manual Therapy: Myofascial release Myofascial Release: MFR and manual stretching to left anterior shoulder, posterior shoulder, lateral arm, pectoral, and scapular region to decrease pain and restrictions and increase pain free mobiility, within parameters of protocol. [phone]  Occupational Therapy Assessment and Plan OT Assessment and Plan Clinical Impression Statement: A:  Continues to have significant stiffness and hard end feel with flexion and abduction PROM and manual stretching.  Good form with AAROM in supine.   OT Plan: P:  Increase reps with AAROM.  Add additional scapular stabilty exercises for greater alignment, less pain   Goals Short Term Goals Time to Complete Short Term Goals: 6 weeks Short Term Goal 1: Patient will be educated on a HEP. Short Term Goal 1 Progress: Progressing toward goal Short Term Goal 2: Patient will increase PROM to [facility] in [place] for increased independence with dressing and bathing. Short Term Goal 2 Progress: Progressing toward goal Short Term Goal 3: Patient will increase strength in his left shoulder to 3+/5 for increased ability to lift gardening tools. Short Term Goal 3 Progress: Progressing toward goal Short Term Goal 4: Patient will decrease pain level to 2/10 with active reaching in his left shoulder. Short Term Goal 4 Progress: Progressing toward goal Short Term Goal 5: Patient will decrease fascial restrictions from max to moderate in his left shoulder region.  Short Term Goal 5 Progress: Progressing toward goal Long Term Goals Time to Complete Long Term Goals: 12 weeks Long Term Goal 1: Patient will return to prior  level of I with all B/IADLs and leisure activities.  Long Term  Goal 1 Progress: Progressing toward goal Long Term Goal 2: Patient will increase his left shoulder AROM to [facility] for increased ability to return to golf and bowling.  Long Term Goal 2 Progress: Progressing toward goal Long Term Goal 3: Patient will increase his left shoulder strength to 4+/5 for increased abiilty lift his golf bag. Long Term Goal 3 Progress: Progressing toward goal Long Term Goal 4: Patient will decrease his left shoulder pain level to 1/10 or better when reaching overhead. Long Term Goal 4 Progress: Progressing toward goal Long Term Goal 5: Patient will decrease his left shoulder restrictions to minimal for increased mobility and greater participation in desired activities.  Long Term Goal 5 Progress: Progressing toward goal  Problem List Patient Active Problem List  Diagnosis  . Arthritis of shoulder  . S/P shoulder replacement  . Pain in joint, shoulder region  . Muscle weakness (generalized)    End of Session Activity Tolerance: Patient tolerated treatment well General Behavior During Session: [facility] for tasks performed Cognition: [facility] for tasks performed  Shirlean Mylar, OTR/L  09/14/2012, 3:29 PM

## 2012-09-16 ENCOUNTER — Inpatient Hospital Stay (HOSPITAL_COMMUNITY): Admission: RE | Admit: 2012-09-16 | Payer: Medicare Other | Source: Ambulatory Visit

## 2012-09-19 ENCOUNTER — Ambulatory Visit (HOSPITAL_COMMUNITY)
Admission: RE | Admit: 2012-09-19 | Discharge: 2012-09-19 | Disposition: A | Discharge status: Home / Self Care | Payer: Medicare Other | Source: Ambulatory Visit | Attending: Orthopedic Surgery | Admitting: Orthopedic Surgery

## 2012-09-19 DIAGNOSIS — M6281 Muscle weakness (generalized): Secondary | ICD-10-CM

## 2012-09-19 DIAGNOSIS — Z96612 Presence of left artificial shoulder joint: Secondary | ICD-10-CM

## 2012-09-19 DIAGNOSIS — M25512 Pain in left shoulder: Secondary | ICD-10-CM

## 2012-09-19 NOTE — Progress Notes (Signed)
Occupational Therapy Treatment Patient Details  Name: Scott Zamora MRN: [id number] Date of Birth: 07-10-1935  Today's Date: 09/19/2012 Time: 4098-1191 OT Time Calculation (min): 55 min Manual Therapy 4782-9562 25' Reassessment 1540-1550 10' Therapeutic Exercises 20' Visit#: 8 of 36  Re-eval: 10/17/12    Authorization: [facility] Medicare  Authorization Time Period: before 18th visit  Authorization Visit#: 8 of 18  Subjective Symptoms/Limitations Symptoms: S:  I think Thursday night I slept in an awkard night.  I have had some discomfort since then. Limitations: Please follow protocol.  NO RESISTED IR for 6 WEEKS.  2/20-3/13  Initiate AAROM, progress flexion to 90-100, ER scapular plane 30, IR scapular plane at 45 to side, AAROM ER/IR in supine with Dowel.  Rhythmic Stabilization in supine for flexion/ext, ER/IR in scapular plane, isometrics ER, FLex, Ext , ABd, pulley exercises.  Please see scanned protocol for exercises beyond 03/13 Special Tests: DASH score was 79.5 and is currently 61.36  with 0 being ideal score Pain Assessment Currently in Pain?: Yes Pain Score:   2 Pain Location: Shoulder Pain Orientation: Left Pain Type: Acute pain  Precautions/Restrictions    2/20-3/13  Initiate AAROM, progress flexion to 90-100, ER scapular plane 30, IR scapular plane at 45 to side, AAROM ER/IR in supine with Dowel.  Rhythmic Stabilization in supine for flexion/ext, ER/IR in scapular plane, isometrics ER, FLex, Ext , ABd, pulley exercises.  Please see scanned protocol for exercises beyond 03/13  Exercise/Treatments Supine Protraction: PROM;10 reps;AAROM;15 reps Horizontal ABduction: PROM;10 reps;AAROM;15 reps External Rotation: PROM;10 reps;AAROM;15 reps Internal Rotation: PROM;10 reps;AAROM;15 reps Flexion: PROM;10 reps;AAROM;15 reps ABduction: PROM;10 reps;AAROM;15 reps Seated Elevation: AROM;15 reps Extension: AROM;15 reps Row: AROM;15 reps Therapy Ball Flexion: 20  reps ABduction: 20 reps ROM / Strengthening / Isometric Strengthening Proximal Shoulder Strengthening, Supine: 10 reps with facilitation/AAROM  Prot/Ret//Elev/Dep: 1' with max facilitation and vg Rhythmic Stabilization, Supine: 15"      Manual Therapy Manual Therapy: Myofascial release Myofascial Release: MFR and manual stretching to left anterior shoulder, posterior shoulder, lateral arm, pectoral, and scapular region to decrease pain and restrictions and increase pain free mobiility, within parameters of protocol.  Occupational Therapy Assessment and Plan OT Assessment and Plan Clinical Impression Statement: A:  Reassessed for MD progress note:  supine A/PROM (initial evaluation PROM supine)  flexion 80/110 (70), abduction 70/110 (50), external rotation with shoulder adducted 25/55 (15), internal rotation with shoulder adducted 85 (80).   OT Plan: P:  Continue treatment 3 times per week x 4 weeks.  Begin next phase of protocol on 09/22/12.  Complete prot/ret//elev/dep with less facilitation.    Goals Short Term Goals Time to Complete Short Term Goals: 6 weeks Short Term Goal 1: Patient will be educated on a HEP. Short Term Goal 1 Progress: Met Short Term Goal 2: Patient will increase PROM to [facility] in [place] for increased independence with dressing and bathing. Short Term Goal 2 Progress: Progressing toward goal Short Term Goal 3: Patient will increase strength in his left shoulder to 3+/5 for increased ability to lift gardening tools. Short Term Goal 3 Progress: Progressing toward goal Short Term Goal 4: Patient will decrease pain level to 2/10 with active reaching in his left shoulder. Short Term Goal 4 Progress: Met Short Term Goal 5: Patient will decrease fascial restrictions from max to moderate in his left shoulder region.  Short Term Goal 5 Progress: Progressing toward goal Long Term Goals Time to Complete Long Term Goals: 12 weeks Long Term Goal 1: Patient will return  to prior  level of I with all B/IADLs and leisure activities.  Long Term Goal 1 Progress: Progressing toward goal Long Term Goal 2: Patient will increase his left shoulder AROM to [facility] for increased ability to return to golf and bowling.  Long Term Goal 2 Progress: Progressing toward goal Long Term Goal 3: Patient will increase his left shoulder strength to 4+/5 for increased abiilty lift his golf bag. Long Term Goal 3 Progress: Progressing toward goal Long Term Goal 4: Patient will decrease his left shoulder pain level to 1/10 or better when reaching overhead. Long Term Goal 4 Progress: Progressing toward goal Long Term Goal 5: Patient will decrease his left shoulder restrictions to minimal for increased mobility and greater participation in desired activities.  Long Term Goal 5 Progress: Progressing toward goal  Problem List Patient Active Problem List  Diagnosis  . Arthritis of shoulder  . S/P shoulder replacement  . Pain in joint, shoulder region  . Muscle weakness (generalized)    End of Session Activity Tolerance: Patient tolerated treatment well General Behavior During Session: [facility] for tasks performed Cognition: [facility] for tasks performed  GO Functional Assessment Tool Used: DASH scored 61.36 Functional Limitation: Carrying, moving and handling objects Carrying, Moving and Handling Objects Current Status ([id number]): At least 60 percent but less than 80 percent impaired, limited or restricted Carrying, Moving and Handling Objects Goal Status [phone]): At least 1 percent but less than 20 percent impaired, limited or restricted  Shirlean Mylar, OTR/L  09/19/2012, 4:17 PM

## 2012-09-21 ENCOUNTER — Ambulatory Visit (HOSPITAL_COMMUNITY): Payer: Medicare Other

## 2012-09-23 ENCOUNTER — Ambulatory Visit (HOSPITAL_COMMUNITY)
Admission: RE | Admit: 2012-09-23 | Discharge: 2012-09-23 | Disposition: A | Discharge status: Home / Self Care | Payer: Medicare Other | Source: Ambulatory Visit | Attending: Orthopedic Surgery | Admitting: Orthopedic Surgery

## 2012-09-23 DIAGNOSIS — Z96612 Presence of left artificial shoulder joint: Secondary | ICD-10-CM

## 2012-09-23 DIAGNOSIS — M25512 Pain in left shoulder: Secondary | ICD-10-CM

## 2012-09-23 DIAGNOSIS — M6281 Muscle weakness (generalized): Secondary | ICD-10-CM

## 2012-09-23 NOTE — Progress Notes (Signed)
Occupational Therapy Treatment Patient Details  Name: Scott Zamora MRN: [id number] Date of Birth: [birth year]  Today's Date: 09/23/2012 Time: 9147-8295 OT Time Calculation (min): 45 min MFR 1430-1445 15' Therex 6213-0865 30'  Visit#: 9 of 36  Re-eval: 10/17/12    Authorization: [employer]  Authorization Time Period: before 18th visit  Authorization Visit#: 9 of 18  Subjective Pain Assessment Currently in Pain?: No/denies  Precautions/Restrictions     Exercise/Treatments Supine Protraction: PROM;AROM;10 reps Horizontal ABduction: PROM;AAROM;10 reps External Rotation: PROM;AAROM;10 reps Internal Rotation: PROM;AAROM;10 reps Flexion: PROM;AROM;10 reps (attempted but with bad form) ABduction: PROM;AAROM;10 reps Seated Protraction: AAROM;10 reps Horizontal ABduction: AAROM;10 reps (attempted but with bad form) External Rotation: AAROM;10 reps  Internal Rotation: AAROM;10 reps Flexion: AAROM;10 reps (attempted but with bad form) Abduction: AAROM;10 reps (attempted but with bad form) Sidelying External Rotation: AROM;10 reps Internal Rotation: AROM;10 reps Pulleys Flexion: 1 minute ABduction: 1 minute ROM / Strengthening / Isometric Strengthening Proximal Shoulder Strengthening, Supine: 10X 4 sets      Manual Therapy Manual Therapy: Myofascial release Myofascial Release: MFR and manual stretching to left anterior shoulder, posterior shoulder, lateral arm, pectoral, and scapular region to decrease pain and restrictions and increase pain free mobiility, within parameters of protocol.  Occupational Therapy Assessment and Plan OT Assessment and Plan Clinical Impression Statement: A: Added some AROM exercises in supine and sidelying, decreased time with pulleys, added AAROM seated. Tolerated well. Still limited joint mobility but no pain with manual stretching. Due to limited joint mobility would only recommend AROM protraction in supine and AAROM protraction in seated.   OT Plan: P: Only perform AAROM protraction seated and AROM protraction supine due to limited joint mobility.    Goals Short Term Goals Time to Complete Short Term Goals: 6 weeks Short Term Goal 1: Patient will be educated on a HEP. Short Term Goal 2: Patient will increase PROM to [facility] in [place] for increased independence with dressing and bathing. Short Term Goal 3: Patient will increase strength in his left shoulder to 3+/5 for increased ability to lift gardening tools. Short Term Goal 4: Patient will decrease pain level to 2/10 with active reaching in his left shoulder. Short Term Goal 5: Patient will decrease fascial restrictions from max to moderate in his left shoulder region.  Long Term Goals Time to Complete Long Term Goals: 12 weeks Long Term Goal 1: Patient will return to prior level of I with all B/IADLs and leisure activities.  Long Term Goal 2: Patient will increase his left shoulder AROM to [facility] for increased ability to return to golf and bowling.  Long Term Goal 3: Patient will increase his left shoulder strength to 4+/5 for increased abiilty lift his golf bag. Long Term Goal 4: Patient will decrease his left shoulder pain level to 1/10 or better when reaching overhead. Long Term Goal 5: Patient will decrease his left shoulder restrictions to minimal for increased mobility and greater participation in desired activities.   Problem List Patient Active Problem List  Diagnosis  . Arthritis of shoulder  . S/P shoulder replacement  . Pain in joint, shoulder region  . Muscle weakness (generalized)        [name], OTR/L  09/23/2012, 3:20 PM

## 2012-09-26 ENCOUNTER — Ambulatory Visit (HOSPITAL_COMMUNITY)
Admission: RE | Admit: 2012-09-26 | Discharge: 2012-09-26 | Disposition: A | Discharge status: Home / Self Care | Payer: Medicare Other | Source: Ambulatory Visit | Attending: Orthopedic Surgery | Admitting: Orthopedic Surgery

## 2012-09-26 DIAGNOSIS — Z96612 Presence of left artificial shoulder joint: Secondary | ICD-10-CM

## 2012-09-26 DIAGNOSIS — M6281 Muscle weakness (generalized): Secondary | ICD-10-CM

## 2012-09-26 DIAGNOSIS — M25512 Pain in left shoulder: Secondary | ICD-10-CM

## 2012-09-26 NOTE — Progress Notes (Signed)
Occupational Therapy Treatment Patient Details  Name: Scott Zamora MRN: [id number] Date of Birth: Aug 28, 1934  Today's Date: 09/26/2012 Time: 0102-7253 OT Time Calculation (min): 43 min Manual Therapy 6644-0347 13' Therapeutic Exercises 1320-1350 30' Visit#: 10 of 36  Re-eval: 10/17/12    Authorization: [employer]  Authorization Time Period: before 18th visit  Authorization Visit#: 140 of 18  Subjective Symptoms/Limitations Symptoms: S:  I think the MD said I could do more now.  Please refer to limitations section for updated exercises.   Limitations:  Pain Assessment Currently in Pain?: No/denies Pain Score: 0-No pain  Precautions/Restrictions    Please follow protocol.  For 09/22/12-10/06/12 goals are increasing dynamic stabilization and strength, increase AROM, decrease pain and inflammation, and increase  functional activities.  In addition to prior exercises, ok to add theraband external rotation and internal rotation, rhythmic stabilzation of flexion/extension and external rotaiton internal rotation, sidelying flexion and external rotation, prone row and extension.    Exercise/Treatments Supine Protraction: PROM;AROM;10 reps Horizontal ABduction: PROM;AROM;10 reps External Rotation: PROM;AROM;5 reps Internal Rotation: PROM;AROM;10 reps Flexion: PROM;AROM;AAROM;10 reps ABduction: PROM;AROM;AAROM;10 reps Seated Protraction: AAROM;12 reps Horizontal ABduction: AAROM;12 reps External Rotation: AAROM;12 reps Internal Rotation: AAROM;12 reps Flexion: AAROM;12 reps Abduction: AAROM;12 reps Prone  Extension: AROM;10 reps;Limitations Extension Limitations: standing, flexed at waist Other Prone Exercises: AROM row standing flexed at waist 10 reps Sidelying External Rotation: AROM;10 reps Internal Rotation: AROM;10 reps Flexion: AROM;10 reps Standing External Rotation: Theraband;10 reps Theraband Level (Shoulder External Rotation): Level 2 (Red) Internal Rotation:  Theraband;10 reps Theraband Level (Shoulder Internal Rotation): Level 2 (Red) Extension: Theraband;10 reps Theraband Level (Shoulder Extension): Level 2 (Red) Retraction: Theraband;10 reps Theraband Level (Shoulder Retraction): Level 2 (Red) Pulleys Flexion:  (resume next visit) ABduction:  (resume next visit) Therapy Ball Flexion: 20 reps ABduction: 20 reps Right/Left: 5 reps ROM / Strengthening / Isometric Strengthening Proximal Shoulder Strengthening, Supine: resume next visit Rhythmic Stabilization, Supine: supine flexion/extension for 30 seconds and external rotation and internal rotation with shoulder adducted for 30"      Manual Therapy Manual Therapy: Myofascial release Myofascial Release: Myofascial release and manual stretching to left anterior shoulder, posterior shoulder, lateral arm, pectoral, and scapular region to decrease pain and restrictions and increase pain free mobilty within parameters of protocol.    Occupational Therapy Assessment and Plan OT Assessment and Plan Clinical Impression Statement: A:  Patient able to complete AROM in supine, within available range this date for all shoulder exercises.  Added rhythmic stabilization, theraband for scapular stability.   OT Plan: P:  Add contract and hold exercises in supine and seated for greater proximal shoulder strength needed for increased ability to complete functional activities.     Goals Short Term Goals Time to Complete Short Term Goals: 6 weeks Short Term Goal 1: Patient will be educated on a HEP. Short Term Goal 2: Patient will increase PROM to [facility] in [place] for increased independence with dressing and bathing. Short Term Goal 2 Progress: Progressing toward goal Short Term Goal 3: Patient will increase strength in his left shoulder to 3+/5 for increased ability to lift gardening tools. Short Term Goal 3 Progress: Progressing toward goal Short Term Goal 4: Patient will decrease pain level to 2/10 with active  reaching in his left shoulder. Short Term Goal 4 Progress: Progressing toward goal Short Term Goal 5: Patient will decrease fascial restrictions from max to moderate in his left shoulder region.  Short Term Goal 5 Progress: Progressing toward goal Long Term Goals Time to  Complete Long Term Goals: 12 weeks Long Term Goal 1: Patient will return to prior level of I with all B/IADLs and leisure activities.  Long Term Goal 1 Progress: Progressing toward goal Long Term Goal 2: Patient will increase his left shoulder AROM to [facility] for increased ability to return to golf and bowling.  Long Term Goal 2 Progress: Progressing toward goal Long Term Goal 3: Patient will increase his left shoulder strength to 4+/5 for increased abiilty lift his golf bag. Long Term Goal 3 Progress: Progressing toward goal Long Term Goal 4: Patient will decrease his left shoulder pain level to 1/10 or better when reaching overhead. Long Term Goal 4 Progress: Progressing toward goal Long Term Goal 5: Patient will decrease his left shoulder restrictions to minimal for increased mobility and greater participation in desired activities.  Long Term Goal 5 Progress: Progressing toward goal  Problem List Patient Active Problem List  Diagnosis  . Arthritis of shoulder  . S/P shoulder replacement  . Pain in joint, shoulder region  . Muscle weakness (generalized)    End of Session Activity Tolerance: Patient tolerated treatment well General Behavior During Session: [facility] for tasks performed Cognition: [facility] for tasks performed  GO    Shirlean Mylar, OTR/L  09/26/2012, 3:07 PM

## 2012-09-28 ENCOUNTER — Ambulatory Visit (HOSPITAL_COMMUNITY)
Admission: RE | Admit: 2012-09-28 | Discharge: 2012-09-28 | Disposition: A | Discharge status: Home / Self Care | Payer: Medicare Other | Source: Ambulatory Visit | Attending: Pulmonary Disease | Admitting: Pulmonary Disease

## 2012-09-28 DIAGNOSIS — M25512 Pain in left shoulder: Secondary | ICD-10-CM

## 2012-09-28 DIAGNOSIS — Z96612 Presence of left artificial shoulder joint: Secondary | ICD-10-CM

## 2012-09-28 DIAGNOSIS — M6281 Muscle weakness (generalized): Secondary | ICD-10-CM

## 2012-09-28 NOTE — Progress Notes (Signed)
Occupational Therapy Treatment Patient Details  Name: KESHON MARKOVITZ MRN: [id number] Date of Birth: 04-19-1935  Today's Date: 09/28/2012 Time: 4098-1191 OT Time Calculation (min): 40 min Manual Therapy [id number] 14' Therapeutic Exercises [phone] 26' Visit#: 11 of 36  Re-eval: 10/17/12    Authorization: [employer]  Authorization Time Period: before 18th visit  Authorization Visit#: 11 of 18  Subjective Symptoms/Limitations Symptoms: S:  I was able to put my wallet in my back pocket today, and it didnt even hurt!! Limitations: Please follow protocol.  For 09/22/12-10/06/12 goals are increasing dynamic stabilization and strength, increase AROM, decrease pain and inflammation, and increase  functional activities.  In addition to prior exercises, ok to add theraband external rotation and internal rotation, rhythmic stabilzation of flexion/extension and external rotaiton internal rotation, sidelying flexion and external rotation, prone row and extension.   Pain Assessment Currently in Pain?: No/denies Pain Score: 0-No pain  Precautions/Restrictions    Please follow protocol.  For 09/22/12-10/06/12 goals are increasing dynamic stabilization and strength, increase AROM, decrease pain and inflammation, and increase  functional activities.  In addition to prior exercises, ok to add theraband external rotation and internal rotation, rhythmic stabilzation of flexion/extension and external rotaiton internal rotation, sidelying flexion and external rotation, prone row and extension  Exercise/Treatments Supine Protraction: PROM;AROM;10 reps;12 reps Horizontal ABduction: PROM;10 reps;AROM;12 reps External Rotation: PROM;10 reps;AROM;12 reps Internal Rotation: PROM;10 reps;AROM;12 reps Flexion: PROM;10 reps;AAROM;12 reps ABduction: PROM;10 reps;AAROM;12 reps Seated Protraction: AAROM;12 reps Horizontal ABduction: AAROM;12 reps External Rotation: AAROM;12 reps Internal Rotation: AAROM;12  reps Flexion: AAROM;12 reps Abduction: AAROM;12 reps Prone  Extension: AROM;12 reps Extension Limitations: standing, flexed at waist Other Prone Exercises: AROM row standing flexed at waist 12 reps Sidelying External Rotation: AROM;15 reps Internal Rotation: AROM;15 reps Flexion: AROM;15 reps Standing External Rotation: Theraband;10 reps Theraband Level (Shoulder External Rotation): Level 3 (Green) Internal Rotation: Theraband;10 reps Theraband Level (Shoulder Internal Rotation): Level 3 (Green) Extension: Theraband;10 reps Theraband Level (Shoulder Extension): Level 3 (Green) Retraction: Theraband;10 reps Theraband Level (Shoulder Retraction): Level 3 (Green) Pulleys   Therapy Ball   ROM / Strengthening / Isometric Strengthening Wall Wash: 1' Proximal Shoulder Strengthening, Supine: 15 reps of each movement without resting between activities Rhythmic Stabilization, Supine: supine flexion/extension for 30 seconds and external rotation and internal rotation with shoulder adducted for 30"      Manual Therapy Manual Therapy: Myofascial release Myofascial Release: Myofascial release and manual stretching to left anterior shoulder, posterior shoulder, lateral arm, pectoral, and scapular region to decrease pain and restrictions and increase pain free mobilty within parameters of protocol.   Occupational Therapy Assessment and Plan OT Assessment and Plan Clinical Impression Statement: A:  Increased strength noted during proximal shoulder strengthening and rhythmic stabilization in supine.   OT Plan: P:  Add contract and hold exercises in supine and seated for greater proximal shoulder strength needed for increased ability to complete functional activities.     Goals Short Term Goals Time to Complete Short Term Goals: 6 weeks Short Term Goal 1: Patient will be educated on a HEP. Short Term Goal 2: Patient will increase PROM to [facility] in LUE for increased independence with dressing and  bathing. Short Term Goal 3: Patient will increase strength in his left shoulder to 3+/5 for increased ability to lift gardening tools. Short Term Goal 4: Patient will decrease pain level to 2/10 with active reaching in his left shoulder. Short Term Goal 5: Patient will decrease fascial restrictions from max to moderate in his left shoulder  region.  Long Term Goals Time to Complete Long Term Goals: 12 weeks Long Term Goal 1: Patient will return to prior level of I with all B/IADLs and leisure activities.  Long Term Goal 2: Patient will increase his left shoulder AROM to [facility] for increased ability to return to golf and bowling.  Long Term Goal 3: Patient will increase his left shoulder strength to 4+/5 for increased abiilty lift his golf bag. Long Term Goal 4: Patient will decrease his left shoulder pain level to 1/10 or better when reaching overhead. Long Term Goal 5: Patient will decrease his left shoulder restrictions to minimal for increased mobility and greater participation in desired activities.   Problem List Patient Active Problem List  Diagnosis  . Arthritis of shoulder  . S/P shoulder replacement  . Pain in joint, shoulder region  . Muscle weakness (generalized)    End of Session Activity Tolerance: Patient tolerated treatment well General Behavior During Session: [facility] for tasks performed Cognition: [facility] for tasks performed  Shirlean Mylar, OTR/L  09/28/2012, 2:30 PM

## 2012-09-30 ENCOUNTER — Ambulatory Visit (HOSPITAL_COMMUNITY)
Admission: RE | Admit: 2012-09-30 | Discharge: 2012-09-30 | Disposition: A | Discharge status: Home / Self Care | Payer: Medicare Other | Source: Ambulatory Visit | Attending: Pulmonary Disease | Admitting: Pulmonary Disease

## 2012-09-30 DIAGNOSIS — Z96612 Presence of left artificial shoulder joint: Secondary | ICD-10-CM

## 2012-09-30 DIAGNOSIS — M25512 Pain in left shoulder: Secondary | ICD-10-CM

## 2012-09-30 DIAGNOSIS — M6281 Muscle weakness (generalized): Secondary | ICD-10-CM

## 2012-09-30 NOTE — Progress Notes (Signed)
Occupational Therapy Treatment Patient Details  Name: ESPEN BETHEL MRN: [id number] Date of Birth: [birth year]  Today's Date: 09/30/2012 Time: 6213-0865 OT Time Calculation (min): 40 min Manual Therapy [id number] 12' Therapeutic Exercises [phone] 28' Visit#: 12 of 36  Re-eval: 10/17/12    Authorization: [facility] Medicare  Authorization Time Period: before 18th visit  Authorization Visit#: 12 of 18  Subjective Symptoms/Limitations Symptoms: S:  Its been feeling pretty food.  Limitations: Please follow protocol.  For 09/22/12-10/06/12 goals are increasing dynamic stabilization and strength, increase AROM, decrease pain and inflammation, and increase  functional activities.  In addition to prior exercises, ok to add theraband external rotation and internal rotation, rhythmic stabilzation of flexion/extension and external rotaiton internal rotation, sidelying flexion and external rotation, prone row and extension.   Pain Assessment Currently in Pain?: No/denies  Precautions/Restrictions    or 09/22/12-10/06/12 goals are increasing dynamic stabilization and strength, increase AROM, decrease pain and inflammation, and increase  functional activities.  In addition to prior exercises, ok to add theraband external rotation and internal rotation, rhythmic stabilzation of flexion/extension and external rotaiton internal rotation, sidelying flexion and external rotation, prone row and extension.    Exercise/Treatments Supine Protraction: PROM;10 reps;AROM;12 reps Horizontal ABduction: PROM;10 reps;AROM;12 reps External Rotation: PROM;10 reps;AROM;12 reps Internal Rotation: PROM;10 reps;AROM;12 reps Flexion: PROM;10 reps;AROM;12 reps ABduction: PROM;10 reps;AROM;12 reps Seated Protraction: AAROM;12 reps Horizontal ABduction: AAROM;12 reps External Rotation: AAROM;12 reps Internal Rotation: AAROM;12 reps Flexion: AAROM;12 reps Abduction: AAROM;12 reps Prone  Extension: AROM;12 reps Extension  Limitations: standing, flexed at waist Other Prone Exercises: AROM row standing flexed at waist 12 reps Sidelying External Rotation: AROM;15 reps Internal Rotation: AROM;15 reps Flexion: AROM;15 reps Standing External Rotation: Theraband;10 reps Theraband Level (Shoulder External Rotation): Level 3 (Green) Internal Rotation: Theraband;10 reps Theraband Level (Shoulder Internal Rotation): Level 3 (Green) Extension: Theraband;10 reps Theraband Level (Shoulder Extension): Level 3 (Green) Retraction: Theraband;10 reps Theraband Level (Shoulder Retraction): Level 3 (Green) Therapy Ball Right/Left: 5 reps ROM / Strengthening / Isometric Strengthening Wall Wash: 2 minutes Proximal Shoulder Strengthening, Supine: 15 reps of each movement without resting between activities Rhythmic Stabilization, Supine: supine flexion/extension for 30 seconds and external rotation and internal rotation with shoulder adducted for 30"      Manual Therapy Manual Therapy: Myofascial release Myofascial Release: Myofascial release and manual stretching to left anterior shoulder, posterior shoulder, lateral arm, pectoral, and scapular region to decrease pain and restrictions and increase pain free mobilty within parameters of protocol.   Occupational Therapy Assessment and Plan OT Assessment and Plan Clinical Impression Statement: A:  Able to complete flexion and abduction AROM in supine for the first time this date.   OT Plan: P:  Add contract and hold exercises in supine and seated for greater proximal shoulder strength needed for increased ability to complete functional activities.     Goals Short Term Goals Time to Complete Short Term Goals: 6 weeks Short Term Goal 1: Patient will be educated on a HEP. Short Term Goal 2: Patient will increase PROM to [facility] in LUE for increased independence with dressing and bathing. Short Term Goal 3: Patient will increase strength in his left shoulder to 3+/5 for increased  ability to lift gardening tools. Short Term Goal 4: Patient will decrease pain level to 2/10 with active reaching in his left shoulder. Short Term Goal 5: Patient will decrease fascial restrictions from max to moderate in his left shoulder region.  Long Term Goals Time to Complete Long Term Goals: 12 weeks Long Term  Goal 1: Patient will return to prior level of I with all B/IADLs and leisure activities.  Long Term Goal 2: Patient will increase his left shoulder AROM to [facility] for increased ability to return to golf and bowling.  Long Term Goal 3: Patient will increase his left shoulder strength to 4+/5 for increased abiilty lift his golf bag. Long Term Goal 4: Patient will decrease his left shoulder pain level to 1/10 or better when reaching overhead. Long Term Goal 5: Patient will decrease his left shoulder restrictions to minimal for increased mobility and greater participation in desired activities.   Problem List Patient Active Problem List  Diagnosis  . Arthritis of shoulder  . S/P shoulder replacement  . Pain in joint, shoulder region  . Muscle weakness (generalized)    End of Session Activity Tolerance: Patient tolerated treatment well General Behavior During Session: [facility] for tasks performed Cognition: [facility] for tasks performed  GO    Shirlean Mylar, OTR/L  09/30/2012, 3:11 PM

## 2012-10-03 ENCOUNTER — Ambulatory Visit (HOSPITAL_COMMUNITY): Payer: Medicare Other | Admitting: Occupational Therapy

## 2012-10-05 ENCOUNTER — Ambulatory Visit (HOSPITAL_COMMUNITY)
Admission: RE | Admit: 2012-10-05 | Discharge: 2012-10-05 | Disposition: A | Discharge status: Home / Self Care | Payer: Medicare Other | Source: Ambulatory Visit | Attending: Pulmonary Disease | Admitting: Pulmonary Disease

## 2012-10-05 DIAGNOSIS — M25512 Pain in left shoulder: Secondary | ICD-10-CM

## 2012-10-05 DIAGNOSIS — M6281 Muscle weakness (generalized): Secondary | ICD-10-CM

## 2012-10-05 DIAGNOSIS — Z96612 Presence of left artificial shoulder joint: Secondary | ICD-10-CM

## 2012-10-05 NOTE — Progress Notes (Signed)
Occupational Therapy Treatment Patient Details  Name: Scott Zamora MRN: [id number] Date of Birth: 1935-06-10  Today's Date: 10/05/2012 Time: 4098-1191 OT Time Calculation (min): 49 min Manual Therapy [id number] 21' Therapeutic Exercise [phone] 25'  Visit#: 13 of 36  Re-eval: 10/17/12    Authorization: [employer]  Authorization Time Period: before 18th visit  Authorization Visit#: 13 of 18  Subjective Symptoms/Limitations Symptoms: S:  I think it is doing wonderful. Pain Assessment Currently in Pain?: No/denies Pain Score: 0-No pain  Precautions/Restrictions     Exercise/Treatments Supine Protraction: PROM;10 reps;AROM;15 reps Horizontal ABduction: PROM;10 reps;AROM;15 reps External Rotation: PROM;10 reps;AROM;15 reps Internal Rotation: PROM;10 reps;AROM;15 reps Flexion: PROM;10 reps;AROM;15 reps ABduction: PROM;10 reps;AROM;15 reps Seated Protraction: AAROM;15 reps Horizontal ABduction: AAROM;15 reps External Rotation: AAROM;15 reps Internal Rotation: AAROM;15 reps Flexion: AAROM;15 reps Abduction: AAROM;15 reps Prone  Extension: AROM;12 reps;Other (comment) Extension Limitations: standing, flexed at waist Other Prone Exercises: AROM row standing flexed at waist 15 reps Sidelying External Rotation: Other (comment) (resume next session) Internal Rotation: Other (comment) (resume next session) Flexion: Other (comment) (resume next session) Standing External Rotation: Other (comment) (resume all next session) Pulleys Flexion: 2 minutes ABduction: 2 minutes Therapy Ball Flexion: 20 reps ABduction: 20 reps Right/Left: 5 reps ROM / Strengthening / Isometric Strengthening Wall Wash: 3' Proximal Shoulder Strengthening, Supine: resume next session Rhythmic Stabilization, Supine: resume next session        Manual Therapy Manual Therapy: Myofascial release Myofascial Release: Myofascial release and manual stretching to left anterior shoulder, posterior  shoulder, lateral arm, pectoral and scapular region to decrease pain and restrictions and increase pain free mobility within parameters of protocol.  Occupational Therapy Assessment and Plan OT Assessment and Plan Clinical Impression Statement: A: Increased time with wall wash.  Tactile assist needed with pulleys for good form.  Some exercises missed due to time. OT Plan: P:  Add contract and hold exercises in supine and seated for greater proximal shoulder strength needed for increased ability to complete functional activities.     Goals Short Term Goals Time to Complete Short Term Goals: 6 weeks Short Term Goal 1: Patient will be educated on a HEP. Short Term Goal 2: Patient will increase PROM to [facility] in LUE for increased independence with dressing and bathing. Short Term Goal 3: Patient will increase strength in his left shoulder to 3+/5 for increased ability to lift gardening tools. Short Term Goal 4: Patient will decrease pain level to 2/10 with active reaching in his left shoulder. Short Term Goal 5: Patient will decrease fascial restrictions from max to moderate in his left shoulder region.  Long Term Goals Time to Complete Long Term Goals: 12 weeks Long Term Goal 1: Patient will return to prior level of I with all B/IADLs and leisure activities.  Long Term Goal 2: Patient will increase his left shoulder AROM to [facility] for increased ability to return to golf and bowling.  Long Term Goal 3: Patient will increase his left shoulder strength to 4+/5 for increased abiilty lift his golf bag. Long Term Goal 4: Patient will decrease his left shoulder pain level to 1/10 or better when reaching overhead. Long Term Goal 5: Patient will decrease his left shoulder restrictions to minimal for increased mobility and greater participation in desired activities.   Problem List Patient Active Problem List  Diagnosis  . Arthritis of shoulder  . S/P shoulder replacement  . Pain in joint, shoulder region   . Muscle weakness (generalized)    End of Session  Activity Tolerance: Patient tolerated treatment well General Behavior During Session: [facility] for tasks performed Cognition: [facility] for tasks performed  GO   Balthazar Dooly L. Yanel Dombrosky, COTA/L 10/05/2012, 1:12 PM

## 2012-10-07 ENCOUNTER — Ambulatory Visit (HOSPITAL_COMMUNITY)
Admission: RE | Admit: 2012-10-07 | Discharge: 2012-10-07 | Disposition: A | Discharge status: Home / Self Care | Payer: Medicare Other | Source: Ambulatory Visit | Attending: Orthopedic Surgery | Admitting: Orthopedic Surgery

## 2012-10-07 DIAGNOSIS — M6281 Muscle weakness (generalized): Secondary | ICD-10-CM

## 2012-10-07 DIAGNOSIS — M25512 Pain in left shoulder: Secondary | ICD-10-CM

## 2012-10-07 DIAGNOSIS — Z96612 Presence of left artificial shoulder joint: Secondary | ICD-10-CM

## 2012-10-07 NOTE — Progress Notes (Signed)
Occupational Therapy Treatment Patient Details  Name: Scott Zamora MRN: [id number] Date of Birth: 1935/02/01  Today's Date: 10/07/2012 Time: 1914-7829 OT Time Calculation (min): 39 min Manual Therapy 5621-3086 14' Therapeutic Exercises [id number] 25' Visit#: 14 of 36  Re-eval: 10/17/12    Authorization: [employer]  Authorization Time Period: before 18th visit  Authorization Visit#: 14 of 18  Subjective Symptoms/Limitations Symptoms: S:  I can use both hands to floss and brush my hair.  Limitations: Please follow protocol.  For 09/22/12-10/06/12 goals are increasing dynamic stabilization and strength, increase AROM, decrease pain and inflammation, and increase  functional activities.  In addition to prior exercises, ok to add theraband external rotation and internal rotation, rhythmic stabilzation of flexion/extension and external rotaiton internal rotation, sidelying flexion and external rotation, prone row and extension.   Pain Assessment Currently in Pain?: No/denies Pain Score: 0-No pain  Precautions/Restrictions    Please follow protocol.  For 09/22/12-10/06/12 goals are increasing dynamic stabilization and strength, increase AROM, decrease pain and inflammation, and increase  functional activities.  In addition to prior exercises, ok to add theraband external rotation and internal rotation, rhythmic stabilzation of flexion/extension and external rotaiton internal rotation, sidelying flexion and external rotation, prone row and extension.     Exercise/Treatments Supine Protraction: PROM;10 reps;AROM;15 reps Horizontal ABduction: PROM;10 reps;AROM;15 reps External Rotation: PROM;10 reps;AROM;15 reps Internal Rotation: PROM;10 reps;AROM;15 reps Flexion: PROM;10 reps;AROM;15 reps ABduction: PROM;10 reps;AROM;15 reps Seated Protraction: AROM;10 reps Horizontal ABduction: AROM;10 reps External Rotation: AROM;10 reps Internal Rotation: AROM;10 reps Flexion: AROM;10  reps Abduction: AROM;10 reps Prone    Sidelying External Rotation: AROM;15 reps Internal Rotation: AROM;15 reps Flexion: AROM;15 reps ABduction: AROM;15 reps Other Sidelying Exercises: in 90 abduction hold 10 seconds, bring arm slightly forward hold 10 seconds slightly back 10 seconds Standing External Rotation: Theraband;15 reps Theraband Level (Shoulder External Rotation): Level 3 (Green) Internal Rotation: Theraband;15 reps Theraband Level (Shoulder Internal Rotation): Level 3 (Green) Extension: Theraband;15 reps Theraband Level (Shoulder Extension): Level 3 (Green) Row: Theraband;15 reps Theraband Level (Shoulder Row): Level 3 (Green) Therapy Ball Right/Left: 5 reps ROM / Strengthening / Isometric Strengthening Proximal Shoulder Strengthening, Supine: 15 repetitions 2 sets      Manual Therapy Manual Therapy: Myofascial release Myofascial Release: Myofascial release and manual stretching to left anterior shoulder, posterior shoulder, lateral arm, pectoral and scapular region to decrease pain and restrictions and increase pain free mobility within parameters of protocol.  Occupational Therapy Assessment and Plan OT Assessment and Plan Clinical Impression Statement: A:  Added AROM in seated, and patient requires max tactile cues to maintain depressed shoulder blade during exercises.   OT Plan: P:  Attempt UBE for increased sustained activity tolerance.    Goals Short Term Goals Time to Complete Short Term Goals: 6 weeks Short Term Goal 1: Patient will be educated on a HEP. Short Term Goal 2: Patient will increase PROM to [facility] in LUE for increased independence with dressing and bathing. Short Term Goal 2 Progress: Progressing toward goal Short Term Goal 3: Patient will increase strength in his left shoulder to 3+/5 for increased ability to lift gardening tools. Short Term Goal 3 Progress: Progressing toward goal Short Term Goal 4: Patient will decrease pain level to 2/10 with  active reaching in his left shoulder. Short Term Goal 4 Progress: Progressing toward goal Short Term Goal 5: Patient will decrease fascial restrictions from max to moderate in his left shoulder region.  Short Term Goal 5 Progress: Progressing toward goal Long Term Goals Time  to Complete Long Term Goals: 12 weeks Long Term Goal 1: Patient will return to prior level of I with all B/IADLs and leisure activities.  Long Term Goal 1 Progress: Progressing toward goal Long Term Goal 2: Patient will increase his left shoulder AROM to [facility] for increased ability to return to golf and bowling.  Long Term Goal 2 Progress: Progressing toward goal Long Term Goal 3: Patient will increase his left shoulder strength to 4+/5 for increased abiilty lift his golf bag. Long Term Goal 3 Progress: Progressing toward goal Long Term Goal 4: Patient will decrease his left shoulder pain level to 1/10 or better when reaching overhead. Long Term Goal 4 Progress: Progressing toward goal Long Term Goal 5: Patient will decrease his left shoulder restrictions to minimal for increased mobility and greater participation in desired activities.  Long Term Goal 5 Progress: Progressing toward goal  Problem List Patient Active Problem List  Diagnosis  . Arthritis of shoulder  . S/P shoulder replacement  . Pain in joint, shoulder region  . Muscle weakness (generalized)    End of Session Activity Tolerance: Patient tolerated treatment well General Behavior During Session: [facility] for tasks performed Cognition: [facility] for tasks performed  GO    Shirlean Mylar, OTR/L  10/07/2012, 1:24 PM

## 2012-10-12 ENCOUNTER — Ambulatory Visit (HOSPITAL_COMMUNITY)
Admission: RE | Admit: 2012-10-12 | Discharge: 2012-10-12 | Disposition: A | Discharge status: Home / Self Care | Payer: Medicare Other | Source: Ambulatory Visit | Attending: Pulmonary Disease | Admitting: Pulmonary Disease

## 2012-10-12 DIAGNOSIS — M25619 Stiffness of unspecified shoulder, not elsewhere classified: Secondary | ICD-10-CM | POA: Insufficient documentation

## 2012-10-12 DIAGNOSIS — IMO0001 Reserved for inherently not codable concepts without codable children: Secondary | ICD-10-CM | POA: Insufficient documentation

## 2012-10-12 DIAGNOSIS — M25512 Pain in left shoulder: Secondary | ICD-10-CM

## 2012-10-12 DIAGNOSIS — M6281 Muscle weakness (generalized): Secondary | ICD-10-CM

## 2012-10-12 DIAGNOSIS — M25519 Pain in unspecified shoulder: Secondary | ICD-10-CM | POA: Insufficient documentation

## 2012-10-12 DIAGNOSIS — Z96612 Presence of left artificial shoulder joint: Secondary | ICD-10-CM

## 2012-10-12 NOTE — Progress Notes (Signed)
Occupational Therapy Treatment Patient Details  Name: Scott Zamora MRN: [id number] Date of Birth: 08-Aug-1934  Today's Date: 10/12/2012 Time: 0102-0150 OT Time Calculation (min): 48 min Manual Therapy 102-118 16' Therapeutic Exercise 119-150 31'  Visit#: 15 of 36  Re-eval: 10/17/12    Authorization: [employer]  Authorization Time Period: before 18th visit  Authorization Visit#: 15 of 18  Subjective Symptoms/Limitations Symptoms: S:  Sometimes at night I wake up with a little pain. Pain Assessment Currently in Pain?: No/denies Pain Score: 0-No pain  Precautions/Restrictions     Exercise/Treatments Supine Protraction: PROM;10 reps;AROM;20 reps Horizontal ABduction: PROM;10 reps;AROM;20 reps External Rotation: PROM;10 reps;AROM;20 reps Internal Rotation: PROM;10 reps;AROM;20 reps Flexion: PROM;10 reps;AROM;20 reps ABduction: PROM;10 reps;AROM;20 reps Seated Protraction: AROM;12 reps Horizontal ABduction: AROM;12 reps External Rotation: AROM;12 reps Internal Rotation: AROM;12 reps Flexion: AROM;12 reps Abduction: AROM;12 reps Prone  Extension: AROM;Other (comment);15 reps Extension Limitations: standing, flexed at waist Other Prone Exercises: AROM row standing flexed at waist 15 reps Sidelying External Rotation: AROM;15 reps Internal Rotation: AROM;15 reps Flexion: AROM;15 reps ABduction: AROM;15 reps Standing External Rotation: Theraband;15 reps Theraband Level (Shoulder External Rotation): Level 3 (Green) Internal Rotation: Theraband;15 reps Theraband Level (Shoulder Internal Rotation): Level 3 (Green) Extension: Theraband;15 reps Theraband Level (Shoulder Extension): Level 3 (Green) Row: Theraband;15 reps Theraband Level (Shoulder Row): Level 3 (Green) Retraction: Theraband;15 reps Theraband Level (Shoulder Retraction): Level 3 (Green) Therapy Ball Right/Left: 5 reps ROM / Strengthening / Isometric Strengthening UBE (Upper Arm Bike): 2' forward 2'  backwards 1.0 Wall Wash: 3' Proximal Shoulder Strengthening, Supine: 15 repetitions 2 sets Rhythmic Stabilization, Supine: resume next session           Manual Therapy Manual Therapy: Myofascial release Myofascial Release: Myofascial release and manual stretching to left anterior shoulder, posterior shoulder, lateral arm, pectoral and scapular region to decrease pain and restrictions and increase pain free mobility within parameters of protocol  Occupational Therapy Assessment and Plan OT Assessment and Plan Clinical Impression Statement: A;  Added UBE 2' forward 2' backwards to increase UE strength and ROM.  Decreased cues to keep shoulder depressed today.  Added tband exercises to HEP OT Plan: P:  Increase time and resistance on UBE.  Resume missed exercises.  Reassess next week.   Goals Short Term Goals Time to Complete Short Term Goals: 6 weeks Short Term Goal 1: Patient will be educated on a HEP. Short Term Goal 2: Patient will increase PROM to [facility] in LUE for increased independence with dressing and bathing. Short Term Goal 3: Patient will increase strength in his left shoulder to 3+/5 for increased ability to lift gardening tools. Short Term Goal 4: Patient will decrease pain level to 2/10 with active reaching in his left shoulder. Short Term Goal 5: Patient will decrease fascial restrictions from max to moderate in his left shoulder region.  Long Term Goals Time to Complete Long Term Goals: 12 weeks Long Term Goal 1: Patient will return to prior level of I with all B/IADLs and leisure activities.  Long Term Goal 2: Patient will increase his left shoulder AROM to [facility] for increased ability to return to golf and bowling.  Long Term Goal 3: Patient will increase his left shoulder strength to 4+/5 for increased abiilty lift his golf bag. Long Term Goal 4: Patient will decrease his left shoulder pain level to 1/10 or better when reaching overhead. Long Term Goal 5: Patient will  decrease his left shoulder restrictions to minimal for increased mobility and greater participation in  desired activities.   Problem List Patient Active Problem List  Diagnosis  . Arthritis of shoulder  . S/P shoulder replacement  . Pain in joint, shoulder region  . Muscle weakness (generalized)    End of Session Activity Tolerance: Patient tolerated treatment well General Behavior During Session: [facility] for tasks performed Cognition: [facility] for tasks performed  GO   Berda Shelvin L. Noralee Stain, COTA/L 10/12/2012, 2:29 PM

## 2012-10-14 ENCOUNTER — Ambulatory Visit (HOSPITAL_COMMUNITY)
Admission: RE | Admit: 2012-10-14 | Discharge: 2012-10-14 | Disposition: A | Discharge status: Home / Self Care | Payer: Medicare Other | Source: Ambulatory Visit | Attending: Pulmonary Disease | Admitting: Pulmonary Disease

## 2012-10-14 NOTE — Evaluation (Signed)
Occupational Therapy Re-Evaluation & Treatment Note  Patient Details  Name: Scott Zamora MRN: [id number] Date of Birth: [birth year]  Today's Date: 10/14/2012 Time: 1324-4010 OT Time Calculation (min): 42 min MFR 1305-1320 15' Reassess 1320-1325 5' Therex 2725-3664 22'  Visit#: 16 of 36  Re-eval: 11/11/12     Authorization: [employer]  Authorization Time Period: before 28th visit  Authorization Visit#: 16 of 28   Past Medical History:  Past Medical History  Diagnosis Date  . Hypertension   . Vertigo     hx of  . Sleep apnea     has CPAP but does not wear it  . Seasonal allergies   . Arthritis   . Dry skin    Past Surgical History:  Past Surgical History  Procedure Laterality Date  . Knee arthroscopy      left knee  . Colonoscopy w/ polypectomy    . Total shoulder arthroplasty  08/11/2012    Dr Francena Hanly  . Total shoulder arthroplasty  08/11/2012    Procedure: TOTAL SHOULDER ARTHROPLASTY;  Surgeon: Senaida Lange, MD;  Location: [facility] OR;  Service: Orthopedics;  Laterality: Left;    Subjective Symptoms/Limitations Symptoms: S: I was raking today and it didn't hurt. I just want to know what I'm allowed to do and what I'm not allowed to do. Special Tests: DASH score: was 61.36 and is currently 20.4 with ideal score being 0. Pain Assessment Currently in Pain?: No/denies Pain Score: 0-No pain  Precautions/Restrictions  Precautions Precautions: Shoulder Precaution Comments: see protocol   Assessment LUE AROM (degrees) Left Shoulder Flexion: 120 Degrees (Last progress note: 80) Left Shoulder ABduction: 106 Degrees (Last progress note: 70) Left Shoulder Internal Rotation: 90 Degrees (last progress note: 85) Left Shoulder External Rotation: 61 Degrees (last progress note: 25) LUE PROM (degrees) LUE Overall PROM Comments: assessed in supine, ER/IR with shoulder adducted  Left Shoulder Flexion: 140 Degrees (last progress note: 110) Left Shoulder ABduction: 118  Degrees (last progress note: 110) Left Shoulder Internal Rotation: 90 Degrees (last progress note: 85) Left Shoulder External Rotation: 71 Degrees (last progress note: 55)     Exercise/Treatments Supine Protraction: PROM;10 reps;AROM;20 reps Horizontal ABduction: PROM;10 reps;AROM;20 reps External Rotation: PROM;10 reps;AROM;20 reps Internal Rotation: PROM;10 reps;AROM;20 reps Flexion: PROM;10 reps;AROM;20 reps ABduction: PROM;10 reps;AROM;20 reps    Manual Therapy Manual Therapy: Myofascial release Myofascial Release: Myofascial release and manual stretching to left anterior shoulder, posterior shoulder, lateral arm, pectoral and scapular region to decrease pain and restrictions and increase pain free mobility within parameters of protocol  Occupational Therapy Assessment and Plan OT Assessment and Plan Clinical Impression Statement: A: See MD note for progress.  Patient has made great progress with AROM and PROM. Recommend 2x/week for 4 more weeks. OT Plan: P:  Increase time and resistance on UBE.  Resume missed exercises.     Goals Short Term Goals Time to Complete Short Term Goals: 6 weeks Short Term Goal 1: Patient will be educated on a HEP. Short Term Goal 2: Patient will increase PROM to [facility] in LUE for increased independence with dressing and bathing. Short Term Goal 2 Progress: Met Short Term Goal 3: Patient will increase strength in his left shoulder to 3+/5 for increased ability to lift gardening tools. Short Term Goal 3 Progress: Met Short Term Goal 4: Patient will decrease pain level to 2/10 with active reaching in his left shoulder. Short Term Goal 4 Progress: Met Short Term Goal 5: Patient will decrease fascial restrictions from max  to moderate in his left shoulder region.  Short Term Goal 5 Progress: Met Long Term Goals Time to Complete Long Term Goals: 12 weeks Long Term Goal 1: Patient will return to prior level of I with all B/IADLs and leisure activities.   Long Term Goal 1 Progress: Progressing toward goal Long Term Goal 2: Patient will increase his left shoulder AROM to [facility] for increased ability to return to golf and bowling.  Long Term Goal 2 Progress: Progressing toward goal Long Term Goal 3: Patient will increase his left shoulder strength to 4+/5 for increased abiilty lift his golf bag. Long Term Goal 3 Progress: Progressing toward goal Long Term Goal 4: Patient will decrease his left shoulder pain level to 1/10 or better when reaching overhead. Long Term Goal 4 Progress: Progressing toward goal Long Term Goal 5: Patient will decrease his left shoulder restrictions to minimal for increased mobility and greater participation in desired activities.  Long Term Goal 5 Progress: Progressing toward goal  Problem List Patient Active Problem List  Diagnosis  . Arthritis of shoulder  . S/P shoulder replacement  . Pain in joint, shoulder region  . Muscle weakness (generalized)    End of Session Activity Tolerance: Patient tolerated treatment well General Behavior During Session: [facility] for tasks performed Cognition: [facility] for tasks performed  GO Functional Assessment Tool Used: DASH Functional Limitation: Carrying, moving and handling objects Carrying, Moving and Handling Objects Current Status (W0981): At least 20 percent but less than 40 percent impaired, limited or restricted Carrying, Moving and Handling Objects Goal Status [phone]): At least 1 percent but less than 20 percent impaired, limited or restricted  [name], OTR/L,CBIS   10/14/2012, 1:53 PM  Physician Documentation Your signature is required to indicate approval of the treatment plan as stated above.  Please sign and either send electronically or make a copy of this report for your files and return this physician signed original.  Please mark one 1.__approve of plan  2. ___approve of plan with the following conditions.   ______________________________                                                           _____________________ Physician Signature                                                                                                             Date

## 2012-10-19 ENCOUNTER — Ambulatory Visit (HOSPITAL_COMMUNITY)
Admission: RE | Admit: 2012-10-19 | Discharge: 2012-10-19 | Disposition: A | Discharge status: Home / Self Care | Payer: Medicare Other | Source: Ambulatory Visit | Attending: Pulmonary Disease | Admitting: Pulmonary Disease

## 2012-10-19 DIAGNOSIS — M6281 Muscle weakness (generalized): Secondary | ICD-10-CM

## 2012-10-19 DIAGNOSIS — M25512 Pain in left shoulder: Secondary | ICD-10-CM

## 2012-10-19 DIAGNOSIS — Z96612 Presence of left artificial shoulder joint: Secondary | ICD-10-CM

## 2012-10-19 NOTE — Progress Notes (Signed)
Occupational Therapy Treatment Patient Details  Name: Scott Zamora MRN: [id number] Date of Birth: October 09, 1934  Today's Date: 10/19/2012 Time: 4098-1191 OT Time Calculation (min): 21 min Pt. Education-reviewing HEP-21'  Visit#: 17 of 36  Re-eval: 11/11/12    Authorization: [employer]  Authorization Time Period: before 28th visit  Authorization Visit#: 17 of 28  Subjective Symptoms/Limitations Symptoms: S: The doctor told me he wants me to do my exercises at home. Limitations: Please follow protocol.  For 09/22/12-10/06/12 goals are increasing dynamic stabilization and strength, increase AROM, decrease pain and inflammation, and increase  functional activities.  In addition to prior exercises, ok to add theraband external rotation and internal rotation, rhythmic stabilzation of flexion/extension and external rotaiton internal rotation, sidelying flexion and external rotation, prone row and extension.   Pain Assessment Currently in Pain?: No/denies Pain Score: 0-No pain  Precautions/Restrictions        Occupational Therapy Assessment and Plan OT Assessment and Plan Clinical Impression Statement: A:  Reviewed home exercise program.  Added shoulder stretches to home program omitting IR stretch with towel. OT Plan: P:  D/C to HEP see last progress note for status.   Goals Short Term Goals Time to Complete Short Term Goals: 6 weeks Short Term Goal 1: Patient will be educated on a HEP. Short Term Goal 2: Patient will increase PROM to [facility] in LUE for increased independence with dressing and bathing. Short Term Goal 3: Patient will increase strength in his left shoulder to 3+/5 for increased ability to lift gardening tools. Short Term Goal 4: Patient will decrease pain level to 2/10 with active reaching in his left shoulder. Short Term Goal 5: Patient will decrease fascial restrictions from max to moderate in his left shoulder region.  Long Term Goals Time to Complete Long Term  Goals: 12 weeks Long Term Goal 1: Patient will return to prior level of I with all B/IADLs and leisure activities.  Long Term Goal 2: Patient will increase his left shoulder AROM to [facility] for increased ability to return to golf and bowling.  Long Term Goal 3: Patient will increase his left shoulder strength to 4+/5 for increased abiilty lift his golf bag. Long Term Goal 4: Patient will decrease his left shoulder pain level to 1/10 or better when reaching overhead. Long Term Goal 5: Patient will decrease his left shoulder restrictions to minimal for increased mobility and greater participation in desired activities.   Problem List Patient Active Problem List  Diagnosis  . Arthritis of shoulder  . S/P shoulder replacement  . Pain in joint, shoulder region  . Muscle weakness (generalized)    End of Session Activity Tolerance: Patient tolerated treatment well General Behavior During Session: [facility] for tasks performed Cognition: [facility] for tasks performed OT Plan of Care OT Home Exercise Plan: Added shoulder stretches. OT Patient Instructions: handout and demonstration  GO Functional Limitation: Carrying, moving and handling objects Carrying, Moving and Handling Objects Current Status [phone]): At least 20 percent but less than 40 percent impaired, limited or restricted Carrying, Moving and Handling Objects Goal Status [phone]): At least 1 percent but less than 20 percent impaired, limited or restricted Carrying, Moving and Handling Objects Discharge Status [phone]): At least 20 percent but less than 40 percent impaired, limited or restricted Scott Zamora, COTA/L 10/19/2012, 1:40 PM

## 2012-10-21 ENCOUNTER — Ambulatory Visit (HOSPITAL_COMMUNITY): Payer: Medicare Other

## 2013-07-20 ENCOUNTER — Ambulatory Visit (INDEPENDENT_AMBULATORY_CARE_PROVIDER_SITE_OTHER): Payer: Medicare Other | Admitting: Otolaryngology

## 2013-07-20 DIAGNOSIS — R490 Dysphonia: Secondary | ICD-10-CM

## 2013-11-16 ENCOUNTER — Other Ambulatory Visit (HOSPITAL_COMMUNITY): Payer: Self-pay | Admitting: Physician Assistant

## 2013-11-16 DIAGNOSIS — R1909 Other intra-abdominal and pelvic swelling, mass and lump: Secondary | ICD-10-CM

## 2013-11-16 DIAGNOSIS — R609 Edema, unspecified: Secondary | ICD-10-CM

## 2013-11-16 DIAGNOSIS — R229 Localized swelling, mass and lump, unspecified: Secondary | ICD-10-CM | POA: Diagnosis not present

## 2013-11-17 ENCOUNTER — Ambulatory Visit (HOSPITAL_COMMUNITY)
Admission: RE | Admit: 2013-11-17 | Discharge: 2013-11-17 | Disposition: A | Discharge status: Home / Self Care | Payer: Medicare Other | Source: Ambulatory Visit | Attending: Physician Assistant | Admitting: Physician Assistant

## 2013-11-17 DIAGNOSIS — R609 Edema, unspecified: Secondary | ICD-10-CM

## 2013-11-17 DIAGNOSIS — R1909 Other intra-abdominal and pelvic swelling, mass and lump: Secondary | ICD-10-CM | POA: Diagnosis not present

## 2013-11-17 DIAGNOSIS — R1900 Intra-abdominal and pelvic swelling, mass and lump, unspecified site: Secondary | ICD-10-CM | POA: Insufficient documentation

## 2013-12-05 DIAGNOSIS — H26019 Infantile and juvenile cortical, lamellar, or zonular cataract, unspecified eye: Secondary | ICD-10-CM | POA: Diagnosis not present

## 2013-12-05 DIAGNOSIS — H2510 Age-related nuclear cataract, unspecified eye: Secondary | ICD-10-CM | POA: Diagnosis not present

## 2013-12-07 DIAGNOSIS — K4090 Unilateral inguinal hernia, without obstruction or gangrene, not specified as recurrent: Secondary | ICD-10-CM | POA: Diagnosis not present

## 2013-12-12 DIAGNOSIS — M19019 Primary osteoarthritis, unspecified shoulder: Secondary | ICD-10-CM | POA: Diagnosis not present

## 2013-12-12 DIAGNOSIS — M25519 Pain in unspecified shoulder: Secondary | ICD-10-CM | POA: Diagnosis not present

## 2014-02-27 DIAGNOSIS — K4090 Unilateral inguinal hernia, without obstruction or gangrene, not specified as recurrent: Secondary | ICD-10-CM | POA: Diagnosis not present

## 2014-04-13 ENCOUNTER — Encounter (HOSPITAL_COMMUNITY): Payer: Self-pay

## 2014-04-13 ENCOUNTER — Encounter (HOSPITAL_COMMUNITY)
Admission: RE | Admit: 2014-04-13 | Discharge: 2014-04-13 | Disposition: A | Discharge status: Home / Self Care | Payer: Medicare Other | Source: Ambulatory Visit | Attending: General Surgery | Admitting: General Surgery

## 2014-04-13 ENCOUNTER — Encounter (HOSPITAL_COMMUNITY): Payer: Self-pay | Admitting: Pharmacy Technician

## 2014-04-13 DIAGNOSIS — Z01812 Encounter for preprocedural laboratory examination: Secondary | ICD-10-CM | POA: Diagnosis not present

## 2014-04-13 DIAGNOSIS — Z01810 Encounter for preprocedural cardiovascular examination: Secondary | ICD-10-CM | POA: Insufficient documentation

## 2014-04-13 DIAGNOSIS — K4090 Unilateral inguinal hernia, without obstruction or gangrene, not specified as recurrent: Secondary | ICD-10-CM | POA: Diagnosis not present

## 2014-04-13 HISTORY — DX: Unilateral inguinal hernia, without obstruction or gangrene, not specified as recurrent: K40.90

## 2014-04-13 LAB — BASIC METABOLIC PANEL
Anion gap: 11 (ref 5–15)
BUN: 13 mg/dL (ref 6–23)
CO2: 29 meq/L (ref 19–32)
Calcium: 9.1 mg/dL (ref 8.4–10.5)
Chloride: 102 mEq/L (ref 96–112)
Creatinine, Ser: 0.97 mg/dL (ref 0.50–1.35)
GFR calc Af Amer: 90 mL/min — ABNORMAL LOW (ref >90)
GFR calc non Af Amer: 78 mL/min — ABNORMAL LOW (ref >90)
GLUCOSE: 90 mg/dL (ref 70–99)
POTASSIUM: 3.9 meq/L (ref 3.7–5.3)
SODIUM: 142 meq/L (ref 137–147)

## 2014-04-13 LAB — CBC WITH DIFFERENTIAL/PLATELET
Basophils Absolute: 0 10*3/uL (ref 0.0–0.1)
Basophils Relative: 1 % (ref 0–1)
EOS PCT: 1 % (ref 0–5)
Eosinophils Absolute: 0.1 10*3/uL (ref 0.0–0.7)
HEMATOCRIT: 40.4 % (ref 39.0–52.0)
Hemoglobin: 13.8 g/dL (ref 13.0–17.0)
LYMPHS ABS: 1.4 10*3/uL (ref 0.7–4.0)
LYMPHS PCT: 27 % (ref 12–46)
MCH: 30.6 pg (ref 26.0–34.0)
MCHC: 34.2 g/dL (ref 30.0–36.0)
MCV: 89.6 fL (ref 78.0–100.0)
MONO ABS: 0.4 10*3/uL (ref 0.1–1.0)
Monocytes Relative: 7 % (ref 3–12)
NEUTROS ABS: 3.4 10*3/uL (ref 1.7–7.7)
Neutrophils Relative %: 64 % (ref 43–77)
PLATELETS: 193 10*3/uL (ref 150–400)
RBC: 4.51 MIL/uL (ref 4.22–5.81)
RDW: 13.6 % (ref 11.5–15.5)
WBC: 5.3 10*3/uL (ref 4.0–10.5)

## 2014-04-13 NOTE — H&P (Signed)
  NTS SOAP Note  Vital Signs:  Vitals as of: 6/94/8546: Systolic 270: Diastolic 99: Heart Rate 75: Temp 98.43F: Height 49ft 0in: Weight 192Lbs 0 Ounces: Pain Level 3: BMI 26.04  BMI : 26.04 kg/m2  Subjective: This 79 Years 2 Months old Male presents for of a left inguinal hernia.  Has been present for some tiime, but is increasing in size and causing him discomfort.  Can reduce it on own.  Made worse with straining.  Review of Symptoms:  Constitutional:unremarkable   Head:unremarkable    Eyes:unremarkable   Nose/Mouth/Throat:unremarkable Cardiovascular:  unremarkable   Respiratory:unremarkable   Gastrointestin    abdominal pain Genitourinary:    urinary hesitancy   joint and back pain dry Breast:   Hematolgic/Lymphatic:unremarkable     Allergic/Immunologic:unremarkable     Past Medical History:    Reviewed  Past Medical History  Surgical History: shoulder replacement Medical Problems: HTN Allergies: nkda Medications: verapamil, loretidine, terazosin, baby asa, HCTZ, gabapentin   Social History:Reviewed  Social History  Preferred Language: English Race:  Black or African American Ethnicity: Not Hispanic / Latino Age: 79 Years 2 Months Marital Status:  M Alcohol: no   Smoking Status: Never smoker reviewed on 02/27/2014 Functional Status reviewed on 02/27/2014 ------------------------------------------------ Bathing: Normal Cooking: Normal Dressing: Normal Driving: Normal Eating: Normal Managing Meds: Normal Oral Care: Normal Shopping: Normal Toileting: Normal Transferring: Normal Walking: Normal Cognitive Status reviewed on 02/27/2014 ------------------------------------------------ Attention: Normal Decision Making: Normal Language: Normal Memory: Normal Motor: Normal Perception: Normal Problem Solving: Normal Visual and Spatial: Normal   Family History:  Reviewed  Family Health History Family  History is Unknown    Objective Information: General:  Well appearing, well nourished in no distress. Heart:  RRR, no murmur or gallop.  Normal S1, S2.  No S3, S4.  Lungs:    CTA bilaterally, no wheezes, rhonchi, rales.  Breathing unlabored. Abdomen:Soft, NT/ND, normal bowel sounds, no HSM, no masses.  No peritoneal signs.  Reducible left inguinal hernia noted. [id number]    Assessment:Left inguinal hernia  Diagnoses: 550.90 Inguinal hernia (Unilateral inguinal hernia, without obstruction or gangrene, not specified as recurrent)  Procedures: 16967 - OFFICE OUTPATIENT NEW 30 MINUTES    Plan:  Will call to scheduled left inguinal herniorrhaphy with mesh.   Patient Education:Alternative treatments to surgery were discussed with patient (and family).  Risks and benefits  of procedure including bleeding, infection, mesh use, and recurrence were fully explained to the patient (and family) who gave informed consent. Patient/family questions were addressed.  Follow-up:Pending Surgery

## 2014-04-13 NOTE — Patient Instructions (Addendum)
Scott Zamora  04/13/2014   Your procedure is scheduled on:  04/18/2014  Report to [facility] at 8:45 AM.  Call this number if you have problems the morning of surgery: [phone]   Remember:   Do not eat food or drink liquids after midnight.   Take these medicines the morning of surgery with A SIP OF WATER:   Claritin, Zofran, Terazosin, Verapamil   Do not wear jewelry, make-up or nail polish.  Do not wear lotions, powders, or perfumes. You may wear deodorant.  Do not shave 48 hours prior to surgery. Men may shave face and neck.  Do not bring valuables to the hospital.  [facility] is not responsible for any belongings or valuables.               Contacts, dentures or bridgework may not be worn into surgery.  Leave suitcase in the car. After surgery it may be brought to your room.  For patients admitted to the hospital, discharge time is determined by your treatment team.               Patients discharged the day of surgery will not be allowed to drive home.  Name and phone number of your driver:   Special Instructions: Shower using CHG 2 nights before surgery and the night before surgery.  If you shower the day of surgery use CHG.  Use special wash - you have one bottle of CHG for all showers.  You should use approximately 1/3 of the bottle for each shower.   Please read over the following fact sheets that you were given: Surgical Site Infection Prevention and Anesthesia Post-op Instructions   PATIENT INSTRUCTIONS POST-ANESTHESIA  IMMEDIATELY FOLLOWING SURGERY:  Do not drive or operate machinery for the first twenty four hours after surgery.  Do not make any important decisions for twenty four hours after surgery or while taking narcotic pain medications or sedatives.  If you develop intractable nausea and vomiting or a severe headache please notify your doctor immediately.  FOLLOW-UP:  Please make an appointment with your surgeon as instructed. You do not need to follow up with  anesthesia unless specifically instructed to do so.  WOUND CARE INSTRUCTIONS (if applicable):  Keep a dry clean dressing on the anesthesia/puncture wound site if there is drainage.  Once the wound has quit draining you may leave it open to air.  Generally you should leave the bandage intact for twenty four hours unless there is drainage.  If the epidural site drains for more than 36-48 hours please call the anesthesia department.  QUESTIONS?:  Please feel free to call your physician or the hospital operator if you have any questions, and they will be happy to assist you.      Inguinal Hernia, Adult Muscles help keep everything in the body in its proper place. But if a weak spot in the muscles develops, something can poke through. That is called a hernia. When this happens in the lower part of the belly (abdomen), it is called an inguinal hernia. (It takes its name from a part of the body in this region called the inguinal canal.) A weak spot in the wall of muscles lets some fat or part of the small intestine bulge through. An inguinal hernia can develop at any age. Men get them more often than women. CAUSES  In adults, an inguinal hernia develops over time.  It can be triggered by:  Suddenly straining the muscles of the lower  abdomen.  Lifting heavy objects.  Straining to have a bowel movement. Difficult bowel movements (constipation) can lead to this.  Constant coughing. This may be caused by smoking or lung disease.  Being overweight.  Being pregnant.  Working at a job that requires long periods of standing or heavy lifting.  Having had an inguinal hernia before. One type can be an emergency situation. It is called a strangulated inguinal hernia. It develops if part of the small intestine slips through the weak spot and cannot get back into the abdomen. The blood supply can be cut off. If that happens, part of the intestine may die. This situation requires emergency surgery. SYMPTOMS    Often, a small inguinal hernia has no symptoms. It is found when a healthcare provider does a physical exam. Larger hernias usually have symptoms.   In adults, symptoms may include:  A lump in the groin. This is easier to see when the person is standing. It might disappear when lying down.  In men, a lump in the scrotum.  Pain or burning in the groin. This occurs especially when lifting, straining or coughing.  A dull ache or feeling of pressure in the groin.  Signs of a strangulated hernia can include:  A bulge in the groin that becomes very painful and tender to the touch.  A bulge that turns red or purple.  Fever, nausea and vomiting.  Inability to have a bowel movement or to pass gas. DIAGNOSIS  To decide if you have an inguinal hernia, a healthcare provider will probably do a physical examination.  This will include asking questions about any symptoms you have noticed.  The healthcare provider might feel the groin area and ask you to cough. If an inguinal hernia is felt, the healthcare provider may try to slide it back into the abdomen.  Usually no other tests are needed. TREATMENT  Treatments can vary. The size of the hernia makes a difference. Options include:  Watchful waiting. This is often suggested if the hernia is small and you have had no symptoms.  No medical procedure will be done unless symptoms develop.  You will need to watch closely for symptoms. If any occur, contact your healthcare provider right away.  Surgery. This is used if the hernia is larger or you have symptoms.  Open surgery. This is usually an outpatient procedure (you will not stay overnight in a hospital). An cut (incision) is made through the skin in the groin. The hernia is put back inside the abdomen. The weak area in the muscles is then repaired by herniorrhaphy or hernioplasty. Herniorrhaphy: in this type of surgery, the weak muscles are sewn back together. Hernioplasty: a patch or mesh  is used to close the weak area in the abdominal wall.  Laparoscopy. In this procedure, a surgeon makes small incisions. A thin tube with a tiny video camera (called a laparoscope) is put into the abdomen. The surgeon repairs the hernia with mesh by looking with the video camera and using two long instruments. HOME CARE INSTRUCTIONS   After surgery to repair an inguinal hernia:  You will need to take pain medicine prescribed by your healthcare provider. Follow all directions carefully.  You will need to take care of the wound from the incision.  Your activity will be restricted for awhile. This will probably include no heavy lifting for several weeks. You also should not do anything too active for a few weeks. When you can return to work will depend  on the type of job that you have.  During "watchful waiting" periods, you should:  Maintain a healthy weight.  Eat a diet high in fiber (fruits, vegetables and whole grains).  Drink plenty of fluids to avoid constipation. This means drinking enough water and other liquids to keep your urine clear or pale yellow.  Do not lift heavy objects.  Do not stand for long periods of time.  Quit smoking. This should keep you from developing a frequent cough. SEEK MEDICAL CARE IF:   A bulge develops in your groin area.  You feel pain, a burning sensation or pressure in the groin. This might be worse if you are lifting or straining.  You develop a fever of more than 100.5 F (38.1 C). SEEK IMMEDIATE MEDICAL CARE IF:   Pain in the groin increases suddenly.  A bulge in the groin gets bigger suddenly and does not go down.  For men, there is sudden pain in the scrotum. Or, the size of the scrotum increases.  A bulge in the groin area becomes red or purple and is painful to touch.  You have nausea or vomiting that does not go away.  You feel your heart beating much faster than normal.  You cannot have a bowel movement or pass gas.  You  develop a fever of more than 102.0 F (38.9 C). Document Released: 11/15/2008 Document Revised: 09/21/2011 Document Reviewed: 11/15/2008 [facility] Patient Information 2015 [place], [place]. This information is not intended to replace advice given to you by your health care provider. Make sure you discuss any questions you have with your health care provider.

## 2014-04-18 ENCOUNTER — Encounter (HOSPITAL_COMMUNITY)
Admission: RE | Disposition: A | Discharge status: Home / Self Care | Payer: Self-pay | Source: Ambulatory Visit | Attending: General Surgery

## 2014-04-18 ENCOUNTER — Encounter (HOSPITAL_COMMUNITY): Payer: Self-pay | Admitting: *Deleted

## 2014-04-18 ENCOUNTER — Ambulatory Visit (HOSPITAL_COMMUNITY): Payer: Medicare Other | Admitting: Anesthesiology

## 2014-04-18 ENCOUNTER — Encounter (HOSPITAL_COMMUNITY): Payer: Medicare Other | Admitting: Anesthesiology

## 2014-04-18 ENCOUNTER — Ambulatory Visit (HOSPITAL_COMMUNITY)
Admission: RE | Admit: 2014-04-18 | Discharge: 2014-04-18 | Disposition: A | Discharge status: Home / Self Care | Payer: Medicare Other | Source: Ambulatory Visit | Attending: General Surgery | Admitting: General Surgery

## 2014-04-18 DIAGNOSIS — Z791 Long term (current) use of non-steroidal anti-inflammatories (NSAID): Secondary | ICD-10-CM | POA: Insufficient documentation

## 2014-04-18 DIAGNOSIS — Z7982 Long term (current) use of aspirin: Secondary | ICD-10-CM | POA: Diagnosis not present

## 2014-04-18 DIAGNOSIS — Z79899 Other long term (current) drug therapy: Secondary | ICD-10-CM | POA: Diagnosis not present

## 2014-04-18 DIAGNOSIS — K4090 Unilateral inguinal hernia, without obstruction or gangrene, not specified as recurrent: Secondary | ICD-10-CM | POA: Insufficient documentation

## 2014-04-18 DIAGNOSIS — M1990 Unspecified osteoarthritis, unspecified site: Secondary | ICD-10-CM | POA: Insufficient documentation

## 2014-04-18 DIAGNOSIS — I10 Essential (primary) hypertension: Secondary | ICD-10-CM | POA: Insufficient documentation

## 2014-04-18 HISTORY — PX: INGUINAL HERNIA REPAIR: SHX194

## 2014-04-18 HISTORY — PX: INSERTION OF MESH: SHX5868

## 2014-04-18 SURGERY — REPAIR, HERNIA, INGUINAL, ADULT
Anesthesia: General | Site: Abdomen | Laterality: Left

## 2014-04-18 MED ORDER — CHLORHEXIDINE GLUCONATE 4 % EX LIQD: 1.0000 "application " | Freq: Once | CUTANEOUS | Status: DC

## 2014-04-18 MED ORDER — HYDROCODONE-ACETAMINOPHEN 5-325 MG PO TABS: 1.0000 | ORAL_TABLET | ORAL | Status: AC | PRN

## 2014-04-18 MED ORDER — FENTANYL CITRATE 0.05 MG/ML IJ SOLN
INTRAMUSCULAR | Status: AC
  Filled 2014-04-18: qty 5

## 2014-04-18 MED ORDER — LACTATED RINGERS IV SOLN
INTRAVENOUS | Status: DC
  Administered 2014-04-18: 1000 mL via INTRAVENOUS

## 2014-04-18 MED ORDER — KETOROLAC TROMETHAMINE 30 MG/ML IJ SOLN
30.0000 mg | Freq: Once | INTRAMUSCULAR | Status: AC
  Administered 2014-04-18: 30 mg via INTRAVENOUS
  Filled 2014-04-18: qty 1

## 2014-04-18 MED ORDER — SODIUM CHLORIDE 0.9 % IR SOLN
Status: DC | PRN
  Administered 2014-04-18: 1000 mL

## 2014-04-18 MED ORDER — FENTANYL CITRATE 0.05 MG/ML IJ SOLN
INTRAMUSCULAR | Status: DC | PRN
  Administered 2014-04-18 (×3): 25 ug via INTRAVENOUS

## 2014-04-18 MED ORDER — LIDOCAINE HCL 1 % IJ SOLN
INTRAMUSCULAR | Status: DC | PRN
  Administered 2014-04-18: 30 mg via INTRADERMAL

## 2014-04-18 MED ORDER — BUPIVACAINE LIPOSOME 1.3 % IJ SUSP
INTRAMUSCULAR | Status: DC | PRN
  Administered 2014-04-18: 20 mL

## 2014-04-18 MED ORDER — MIDAZOLAM HCL 2 MG/2ML IJ SOLN
INTRAMUSCULAR | Status: AC
  Filled 2014-04-18: qty 2

## 2014-04-18 MED ORDER — FENTANYL CITRATE 0.05 MG/ML IJ SOLN: 25.0000 ug | INTRAMUSCULAR | Status: DC | PRN

## 2014-04-18 MED ORDER — PROPOFOL 10 MG/ML IV EMUL
INTRAVENOUS | Status: AC
  Filled 2014-04-18: qty 20

## 2014-04-18 MED ORDER — PROPOFOL 10 MG/ML IV BOLUS
INTRAVENOUS | Status: DC | PRN
  Administered 2014-04-18: 140 mg via INTRAVENOUS

## 2014-04-18 MED ORDER — FENTANYL CITRATE 0.05 MG/ML IJ SOLN
INTRAMUSCULAR | Status: AC
  Filled 2014-04-18: qty 2

## 2014-04-18 MED ORDER — FENTANYL CITRATE 0.05 MG/ML IJ SOLN
25.0000 ug | INTRAMUSCULAR | Status: AC
  Administered 2014-04-18: 25 ug via INTRAVENOUS

## 2014-04-18 MED ORDER — LIDOCAINE HCL (PF) 1 % IJ SOLN
INTRAMUSCULAR | Status: AC
  Filled 2014-04-18: qty 5

## 2014-04-18 MED ORDER — BUPIVACAINE HCL (PF) 0.5 % IJ SOLN
INTRAMUSCULAR | Status: AC
  Filled 2014-04-18: qty 30

## 2014-04-18 MED ORDER — EPHEDRINE SULFATE 50 MG/ML IJ SOLN
INTRAMUSCULAR | Status: DC | PRN
  Administered 2014-04-18: 10 mg via INTRAVENOUS

## 2014-04-18 MED ORDER — ONDANSETRON HCL 4 MG/2ML IJ SOLN: 4.0000 mg | Freq: Once | INTRAMUSCULAR | Status: DC | PRN

## 2014-04-18 MED ORDER — BUPIVACAINE LIPOSOME 1.3 % IJ SUSP
INTRAMUSCULAR | Status: AC
  Filled 2014-04-18: qty 20

## 2014-04-18 MED ORDER — CEFAZOLIN SODIUM-DEXTROSE 2-3 GM-% IV SOLR
2.0000 g | INTRAVENOUS | Status: DC
  Administered 2014-04-18: 2 g via INTRAVENOUS
  Filled 2014-04-18: qty 50

## 2014-04-18 MED ORDER — ONDANSETRON HCL 4 MG/2ML IJ SOLN
INTRAMUSCULAR | Status: AC
  Filled 2014-04-18: qty 2

## 2014-04-18 MED ORDER — MIDAZOLAM HCL 2 MG/2ML IJ SOLN
1.0000 mg | INTRAMUSCULAR | Status: DC | PRN
  Administered 2014-04-18: 2 mg via INTRAVENOUS

## 2014-04-18 SURGICAL SUPPLY — 40 items
ADH SKN CLS APL DERMABOND .7 (GAUZE/BANDAGES/DRESSINGS) ×2
BAG HAMPER (MISCELLANEOUS) ×3 IMPLANT
BLADE 10 SAFETY STRL DISP (BLADE) IMPLANT
CLOTH BEACON ORANGE TIMEOUT ST (SAFETY) ×3 IMPLANT
COVER LIGHT HANDLE STERIS (MISCELLANEOUS) ×6 IMPLANT
DECANTER SPIKE VIAL GLASS SM (MISCELLANEOUS) IMPLANT
DERMABOND ADVANCED (GAUZE/BANDAGES/DRESSINGS) ×1
DERMABOND ADVANCED .7 DNX12 (GAUZE/BANDAGES/DRESSINGS) ×2 IMPLANT
DRAIN PENROSE 18X1/2 LTX STRL (DRAIN) ×3 IMPLANT
ELECT REM PT RETURN 9FT ADLT (ELECTROSURGICAL) ×3
ELECTRODE REM PT RTRN 9FT ADLT (ELECTROSURGICAL) ×2 IMPLANT
FORMALIN 10 PREFIL 120ML (MISCELLANEOUS) IMPLANT
GLOVE BIOGEL PI IND STRL 7.0 (GLOVE) ×4 IMPLANT
GLOVE BIOGEL PI INDICATOR 7.0 (GLOVE) ×2
GLOVE ECLIPSE 6.5 STRL STRAW (GLOVE) ×6 IMPLANT
GLOVE EXAM NITRILE MD LF STRL (GLOVE) ×3 IMPLANT
GLOVE SURG SS PI 7.5 STRL IVOR (GLOVE) ×3 IMPLANT
GOWN STRL REUS W/ TWL XL LVL3 (GOWN DISPOSABLE) ×2 IMPLANT
GOWN STRL REUS W/TWL LRG LVL3 (GOWN DISPOSABLE) ×6 IMPLANT
GOWN STRL REUS W/TWL XL LVL3 (GOWN DISPOSABLE) ×1
INST SET MINOR GENERAL (KITS) ×3 IMPLANT
KIT ROOM TURNOVER APOR (KITS) ×3 IMPLANT
MANIFOLD NEPTUNE II (INSTRUMENTS) ×3 IMPLANT
MESH MARLEX PLUG MEDIUM (Mesh General) ×3 IMPLANT
NEEDLE HYPO 21X1.5 SAFETY (NEEDLE) ×3 IMPLANT
NS IRRIG 1000ML POUR BTL (IV SOLUTION) ×3 IMPLANT
PACK MINOR (CUSTOM PROCEDURE TRAY) ×3 IMPLANT
PAD ARMBOARD 7.5X6 YLW CONV (MISCELLANEOUS) ×3 IMPLANT
SET BASIN LINEN APH (SET/KITS/TRAYS/PACK) ×3 IMPLANT
SUT NOVA NAB GS-22 2 2-0 T-19 (SUTURE) ×9 IMPLANT
SUT NOVAFIL NAB HGS22 2-0 30IN (SUTURE) IMPLANT
SUT SILK 3 0 (SUTURE)
SUT SILK 3-0 18XBRD TIE 12 (SUTURE) IMPLANT
SUT VIC AB 2-0 CT1 27 (SUTURE) ×1
SUT VIC AB 2-0 CT1 TAPERPNT 27 (SUTURE) ×2 IMPLANT
SUT VIC AB 3-0 SH 27 (SUTURE) ×1
SUT VIC AB 3-0 SH 27X BRD (SUTURE) ×2 IMPLANT
SUT VIC AB 4-0 PS2 27 (SUTURE) ×3 IMPLANT
SUT VICRYL AB 3 0 TIES (SUTURE) IMPLANT
SYR 20CC LL (SYRINGE) ×3 IMPLANT

## 2014-04-18 NOTE — Anesthesia Preprocedure Evaluation (Signed)
Anesthesia Evaluation  Patient identified by MRN, date of birth, ID band Patient awake    Reviewed: Allergy & Precautions, H&P , NPO status , Patient's Chart, lab work & pertinent test results  Airway Mallampati: II TM Distance: >3 FB     Dental  (+) Teeth Intact   Pulmonary sleep apnea ,  breath sounds clear to auscultation        Cardiovascular hypertension, Pt. on medications Rhythm:Regular Rate:Normal     Neuro/Psych    GI/Hepatic   Endo/Other    Renal/GU      Musculoskeletal  (+) Arthritis -,   Abdominal   Peds  Hematology   Anesthesia Other Findings   Reproductive/Obstetrics                           Anesthesia Physical Anesthesia Plan  ASA: II  Anesthesia Plan: General   Post-op Pain Management:    Induction: Intravenous  Airway Management Planned: LMA  Additional Equipment:   Intra-op Plan:   Post-operative Plan: Extubation in OR  Informed Consent: I have reviewed the patients History and Physical, chart, labs and discussed the procedure including the risks, benefits and alternatives for the proposed anesthesia with the patient or authorized representative who has indicated his/her understanding and acceptance.     Plan Discussed with:   Anesthesia Plan Comments:         Anesthesia Quick Evaluation

## 2014-04-18 NOTE — Op Note (Signed)
Patient:  Scott Zamora  DOB:  19-Sep-1934  MRN:  [id number]   Preop Diagnosis:  Left inguinal hernia  Postop Diagnosis:  Same  Procedure:  Left inguinal herniorrhaphy with mesh  Surgeon:  Aviva Signs, M.D.  Anes:  General  Indications:  Patient is a 79 year old black male who presents with a symptomatic left inguinal hernia. The risks and benefits of the procedure including bleeding, infection, mesh use, and the possibility of recurrence of the hernia were fully explained to the patient, who gave informed consent.  Procedure note:  The patient was placed in the supine position. After general anesthesia was administered, the left groin region was prepped and draped using usual sterile technique with Hibiclens. Surgical site confirmation was performed.  An oblique incision was made in the left groin region down to the external oblique aponeuroses. The aponeuroses was incised to the external ring. A Penrose drain was placed around the spermatic cord. The vas deferens was noted within the spermatic cord. An indirect hernia sac was found. This was freed away from the spermatic cord up to the peritoneal reflection and inverted. A medium size Bard prefix plug was then inserted. An onlay patch was then placed along the floor of the inguinal canal and secured to the conjoined tendon and superiorly and the shelving edge of Poupart's ligament inferiorly using 2-0 Novafil interrupted sutures. The internal ring was recreated using a 2-0 Novafil interrupted sutures. The external oblique aponeuroses was reapproximated using a 2-0 Vicryl running suture. Subcutaneous layer was reapproximated using 3-0 Vicryl interrupted suture. The skin was closed using a 4-0 Vicryl subcuticular suture. Exparel was instilled into the surrounding wound. Dermabond was then applied.  All tape and needle counts were correct at the end of the procedure. Patient was awakened and transferred to PACU in stable  condition.  Complications:  None  EBL:  Minimal  Specimen:  None

## 2014-04-18 NOTE — Anesthesia Postprocedure Evaluation (Signed)
  Anesthesia Post-op Note  Patient: Scott Zamora  Procedure(s) Performed: Procedure(s): HERNIA REPAIR INGUINAL ADULT (Left) INSERTION OF MESH (Left)  Patient Location: PACU  Anesthesia Type:General  Level of Consciousness: awake  Airway and Oxygen Therapy: Patient Spontanous Breathing and Patient connected to face mask oxygen  Post-op Pain: none  Post-op Assessment: Post-op Vital signs reviewed, Patient's Cardiovascular Status Stable, Respiratory Function Stable, Patent Airway and No signs of Nausea or vomiting  Post-op Vital Signs: Reviewed and stable  Last Vitals:  Filed Vitals:   04/18/14 1020  BP: 144/92  Pulse:   Temp:   Resp: 15    Complications: No apparent anesthesia complications

## 2014-04-18 NOTE — Interval H&P Note (Signed)
History and Physical Interval Note:  04/18/2014 10:11 AM  Scott Zamora  has presented today for surgery, with the diagnosis of left inguinal hernia  The various methods of treatment have been discussed with the patient and family. After consideration of risks, benefits and other options for treatment, the patient has consented to  Procedure(s): HERNIA REPAIR INGUINAL ADULT (Left) as a surgical intervention .  The patient's history has been reviewed, patient examined, no change in status, stable for surgery.  I have reviewed the patient's chart and labs.  Questions were answered to the patient's satisfaction.     Aviva Signs A

## 2014-04-18 NOTE — Transfer of Care (Signed)
Immediate Anesthesia Transfer of Care Note  Patient: Scott Zamora  Procedure(s) Performed: Procedure(s): HERNIA REPAIR INGUINAL ADULT (Left) INSERTION OF MESH (Left)  Patient Location: PACU  Anesthesia Type:General  Level of Consciousness: awake  Airway & Oxygen Therapy: Patient Spontanous Breathing and Patient connected to face mask oxygen  Post-op Assessment: Report given to PACU RN  Post vital signs: Reviewed and stable  Complications: No apparent anesthesia complications

## 2014-04-18 NOTE — Discharge Instructions (Signed)
Inguinal Hernia, Adult  °Care After °Refer to this sheet in the next few weeks. These discharge instructions provide you with general information on caring for yourself after you leave the hospital. Your caregiver may also give you specific instructions. Your treatment has been planned according to the most current medical practices available, but unavoidable complications sometimes occur. If you have any problems or questions after discharge, please call your caregiver. °HOME CARE INSTRUCTIONS °· Put ice on the operative site. °¨ Put ice in a plastic bag. °¨ Place a towel between your skin and the bag. °¨ Leave the ice on for 15-20 minutes at a time, 03-04 times a day while awake. °· Change bandages (dressings) as directed. °· Keep the wound dry and clean. The wound may be washed gently with soap and water. Gently blot or dab the wound dry. It is okay to take showers 24 to 48 hours after surgery. Do not take baths, use swimming pools, or use hot tubs for 10 days, or as directed by your caregiver. °· Only take over-the-counter or prescription medicines for pain, discomfort, or fever as directed by your caregiver. °· Continue your normal diet as directed. °· Do not lift anything more than 10 pounds or play contact sports for 3 weeks, or as directed. °SEEK MEDICAL CARE IF: °· There is redness, swelling, or increasing pain in the wound. °· There is fluid (pus) coming from the wound. °· There is drainage from a wound lasting longer than 1 day. °· You have an oral temperature above 102° F (38.9° C). °· You notice a bad smell coming from the wound or dressing. °· The wound breaks open after the stitches (sutures) have been removed. °· You notice increasing pain in the shoulders (shoulder strap areas). °· You develop dizzy episodes or fainting while standing. °· You feel sick to your stomach (nauseous) or throw up (vomit). °SEEK IMMEDIATE MEDICAL CARE IF: °· You develop a rash. °· You have difficulty breathing. °· You  develop a reaction or have side effects to medicines you were given. °MAKE SURE YOU:  °· Understand these instructions. °· Will watch your condition. °· Will get help right away if you are not doing well or get worse. °Document Released: 07/30/2006 Document Revised: 09/21/2011 Document Reviewed: 05/29/2009 °[employer]® Patient Information ©2015 [employer]. This information is not intended to replace advice given to you by your health care provider. Make sure you discuss any questions you have with your health care provider. ° °

## 2014-04-18 NOTE — Anesthesia Procedure Notes (Signed)
Procedure Name: LMA Insertion Date/Time: 04/18/2014 10:33 AM Performed by: Tressie Stalker E Pre-anesthesia Checklist: Patient identified, Patient being monitored, Emergency Drugs available, Timeout performed and Suction available Patient Re-evaluated:Patient Re-evaluated prior to inductionOxygen Delivery Method: Circle System Utilized Preoxygenation: Pre-oxygenation with 100% oxygen Intubation Type: IV induction Ventilation: Mask ventilation without difficulty LMA: LMA inserted LMA Size: 5.0 Number of attempts: 1 Placement Confirmation: positive ETCO2 and breath sounds checked- equal and bilateral

## 2014-04-19 ENCOUNTER — Encounter (HOSPITAL_COMMUNITY): Payer: Self-pay | Admitting: General Surgery

## 2014-04-20 NOTE — Addendum Note (Signed)
Addendum created 04/20/14 0955 by Ollen Bowl, CRNA   Modules edited: Anesthesia Medication Administration

## 2014-05-02 DIAGNOSIS — Z23 Encounter for immunization: Secondary | ICD-10-CM | POA: Diagnosis not present

## 2014-06-19 DIAGNOSIS — H25013 Cortical age-related cataract, bilateral: Secondary | ICD-10-CM | POA: Diagnosis not present

## 2014-06-19 DIAGNOSIS — H2513 Age-related nuclear cataract, bilateral: Secondary | ICD-10-CM | POA: Diagnosis not present

## 2014-07-03 DIAGNOSIS — H25013 Cortical age-related cataract, bilateral: Secondary | ICD-10-CM | POA: Diagnosis not present

## 2014-07-03 DIAGNOSIS — H2513 Age-related nuclear cataract, bilateral: Secondary | ICD-10-CM | POA: Diagnosis not present

## 2014-07-18 DIAGNOSIS — H25012 Cortical age-related cataract, left eye: Secondary | ICD-10-CM | POA: Diagnosis not present

## 2014-07-18 DIAGNOSIS — H2512 Age-related nuclear cataract, left eye: Secondary | ICD-10-CM | POA: Diagnosis not present

## 2014-07-18 DIAGNOSIS — H2511 Age-related nuclear cataract, right eye: Secondary | ICD-10-CM | POA: Diagnosis not present

## 2014-07-18 DIAGNOSIS — H25011 Cortical age-related cataract, right eye: Secondary | ICD-10-CM | POA: Diagnosis not present

## 2014-07-25 DIAGNOSIS — H2512 Age-related nuclear cataract, left eye: Secondary | ICD-10-CM | POA: Diagnosis not present

## 2014-07-25 DIAGNOSIS — H25012 Cortical age-related cataract, left eye: Secondary | ICD-10-CM | POA: Diagnosis not present

## 2014-09-11 ENCOUNTER — Encounter: Payer: Self-pay | Admitting: Family Medicine

## 2014-10-16 DIAGNOSIS — I10 Essential (primary) hypertension: Secondary | ICD-10-CM | POA: Diagnosis not present

## 2014-10-16 DIAGNOSIS — N159 Renal tubulo-interstitial disease, unspecified: Secondary | ICD-10-CM | POA: Diagnosis not present

## 2014-10-16 DIAGNOSIS — N401 Enlarged prostate with lower urinary tract symptoms: Secondary | ICD-10-CM | POA: Diagnosis not present

## 2014-11-27 DIAGNOSIS — Z961 Presence of intraocular lens: Secondary | ICD-10-CM | POA: Diagnosis not present

## 2014-11-28 DIAGNOSIS — M159 Polyosteoarthritis, unspecified: Secondary | ICD-10-CM | POA: Diagnosis not present

## 2014-11-28 DIAGNOSIS — R5383 Other fatigue: Secondary | ICD-10-CM | POA: Diagnosis not present

## 2014-11-28 DIAGNOSIS — I10 Essential (primary) hypertension: Secondary | ICD-10-CM | POA: Diagnosis not present

## 2014-11-28 DIAGNOSIS — M542 Cervicalgia: Secondary | ICD-10-CM | POA: Diagnosis not present

## 2015-06-11 DIAGNOSIS — M19011 Primary osteoarthritis, right shoulder: Secondary | ICD-10-CM | POA: Diagnosis not present

## 2015-06-11 DIAGNOSIS — M25511 Pain in right shoulder: Secondary | ICD-10-CM | POA: Diagnosis not present

## 2015-09-03 ENCOUNTER — Other Ambulatory Visit (HOSPITAL_COMMUNITY): Payer: Self-pay | Admitting: *Deleted

## 2015-09-03 NOTE — Pre-Procedure Instructions (Addendum)
Scott Zamora  09/03/2015      [facility] - [place], [place] - [address] [place] [address] Phone: [phone] Fax: [phone]    Your procedure is scheduled on Thursday, September 12, 2015 at 7:30 AM.   Report to [facility] Entrance "A" Admitting Office at 5:30 AM.   Call this number if you have problems the morning of surgery: [phone]   Any questions prior to day of surgery, please call [phone] between 8 & 4 PM.   Remember:  Do not eat food or drink liquids after midnight Wednesday, 09/11/15.   Take these medicines the morning of surgery with A SIP OF WATER: Loratadine (Claritin) - if needed  Stop Aspirin, NSAIDS (Naprosyn, Aleve, Ibuprofen, etc) and Herbal medications as of today.   Do not wear jewelry.  Do not wear lotions, powders, or cologne.  You may NOT wear deodorant.  Men may shave face and neck.  Do not bring valuables to the hospital.  [facility] is not responsible for any belongings or valuables.  Contacts, dentures or bridgework may not be worn into surgery.  Leave your suitcase in the car.  After surgery it may be brought to your room.  For patients admitted to the hospital, discharge time will be determined by your treatment team.  Special instructions:  [name] - Preparing for Surgery  Before surgery, you can play an important role.  Because skin is not sterile, your skin needs to be as free of germs as possible.  You can reduce the number of germs on you skin by washing with CHG (chlorahexidine gluconate) soap before surgery.  CHG is an antiseptic cleaner which kills germs and bonds with the skin to continue killing germs even after washing.  Please DO NOT use if you have an allergy to CHG or antibacterial soaps.  If your skin becomes reddened/irritated stop using the CHG and inform your nurse when you arrive at Short Stay.  Do not shave (including legs and underarms) for at least 48 hours  prior to the first CHG shower.  You may shave your face.  Please follow these instructions carefully:   1.  Shower with CHG Soap the night before surgery and the  morning of Surgery.  2.  If you choose to wash your hair, wash your hair first as usual with you  normal shampoo.  3.  After you shampoo, rinse your hair and body thoroughly to remove the  Shampoo.  4.  Use CHG as you would any other liquid soap.  You can apply chg directly  to the skin and wash gently with scrungie or a clean washcloth.  5.  Apply the CHG Soap to your body ONLY FROM THE NECK DOWN.        Do not use on open wounds or open sores.  Avoid contact with your eyes, ears, mouth and genitals (private parts).  Wash genitals (private parts) with your normal soap.  6.  Wash thoroughly, paying special attention to the area where your surgery will be performed.  7.  Thoroughly rinse your body with warm water from the neck down.  8.  DO NOT shower/wash with your normal soap after using and rinsing off  the CHG Soap.  9.  Pat yourself dry with a clean towel.            10.  Wear clean pajamas.  11.  Place clean sheets on your bed the night of your first shower and do not sleep with pets.  Day of Surgery  Do not apply any lotions/deodorants the morning of surgery.  Please wear clean clothes to the hospital.   Please read over the following fact sheets that you were given. Pain Booklet, Coughing and Deep Breathing, MRSA Information and Surgical Site Infection Prevention

## 2015-09-04 ENCOUNTER — Other Ambulatory Visit: Payer: Self-pay

## 2015-09-04 ENCOUNTER — Encounter (HOSPITAL_COMMUNITY): Payer: Self-pay

## 2015-09-04 ENCOUNTER — Encounter (HOSPITAL_COMMUNITY)
Admission: RE | Admit: 2015-09-04 | Discharge: 2015-09-04 | Disposition: A | Discharge status: Home / Self Care | Payer: Medicare Other | Source: Ambulatory Visit | Attending: Orthopedic Surgery | Admitting: Orthopedic Surgery

## 2015-09-04 DIAGNOSIS — Z79899 Other long term (current) drug therapy: Secondary | ICD-10-CM | POA: Insufficient documentation

## 2015-09-04 DIAGNOSIS — M19011 Primary osteoarthritis, right shoulder: Secondary | ICD-10-CM | POA: Insufficient documentation

## 2015-09-04 DIAGNOSIS — I4510 Unspecified right bundle-branch block: Secondary | ICD-10-CM | POA: Diagnosis not present

## 2015-09-04 DIAGNOSIS — K219 Gastro-esophageal reflux disease without esophagitis: Secondary | ICD-10-CM | POA: Insufficient documentation

## 2015-09-04 DIAGNOSIS — Z01818 Encounter for other preprocedural examination: Secondary | ICD-10-CM | POA: Diagnosis not present

## 2015-09-04 DIAGNOSIS — G4730 Sleep apnea, unspecified: Secondary | ICD-10-CM | POA: Diagnosis not present

## 2015-09-04 DIAGNOSIS — Z7982 Long term (current) use of aspirin: Secondary | ICD-10-CM | POA: Insufficient documentation

## 2015-09-04 DIAGNOSIS — I10 Essential (primary) hypertension: Secondary | ICD-10-CM | POA: Diagnosis not present

## 2015-09-04 DIAGNOSIS — I440 Atrioventricular block, first degree: Secondary | ICD-10-CM | POA: Insufficient documentation

## 2015-09-04 DIAGNOSIS — Z01812 Encounter for preprocedural laboratory examination: Secondary | ICD-10-CM | POA: Insufficient documentation

## 2015-09-04 HISTORY — DX: Gastro-esophageal reflux disease without esophagitis: K21.9

## 2015-09-04 LAB — CBC WITH DIFFERENTIAL/PLATELET
BASOS ABS: 0 10*3/uL (ref 0.0–0.1)
BASOS PCT: 0 %
EOS ABS: 0.1 10*3/uL (ref 0.0–0.7)
Eosinophils Relative: 1 %
HCT: 39.2 % (ref 39.0–52.0)
HEMOGLOBIN: 12.9 g/dL — AB (ref 13.0–17.0)
Lymphocytes Relative: 33 %
Lymphs Abs: 1.8 10*3/uL (ref 0.7–4.0)
MCH: 29.5 pg (ref 26.0–34.0)
MCHC: 32.9 g/dL (ref 30.0–36.0)
MCV: 89.7 fL (ref 78.0–100.0)
MONOS PCT: 8 %
Monocytes Absolute: 0.4 10*3/uL (ref 0.1–1.0)
NEUTROS ABS: 3.2 10*3/uL (ref 1.7–7.7)
NEUTROS PCT: 58 %
Platelets: 214 10*3/uL (ref 150–400)
RBC: 4.37 MIL/uL (ref 4.22–5.81)
RDW: 13.8 % (ref 11.5–15.5)
WBC: 5.6 10*3/uL (ref 4.0–10.5)

## 2015-09-04 LAB — COMPREHENSIVE METABOLIC PANEL
ALBUMIN: 4 g/dL (ref 3.5–5.0)
ALT: 21 U/L (ref 17–63)
ANION GAP: 9 (ref 5–15)
AST: 27 U/L (ref 15–41)
Alkaline Phosphatase: 63 U/L (ref 38–126)
BUN: 12 mg/dL (ref 6–20)
CO2: 25 mmol/L (ref 22–32)
Calcium: 9.4 mg/dL (ref 8.9–10.3)
Chloride: 106 mmol/L (ref 101–111)
Creatinine, Ser: 0.94 mg/dL (ref 0.61–1.24)
GFR calc Af Amer: >60 mL/min (ref >60)
GFR calc non Af Amer: >60 mL/min (ref >60)
GLUCOSE: 89 mg/dL (ref 65–99)
POTASSIUM: 4.1 mmol/L (ref 3.5–5.1)
SODIUM: 140 mmol/L (ref 135–145)
Total Bilirubin: 0.6 mg/dL (ref 0.3–1.2)
Total Protein: 7.5 g/dL (ref 6.5–8.1)

## 2015-09-04 LAB — PROTIME-INR
INR: 1.13 (ref 0.00–1.49)
Prothrombin Time: 14.7 seconds (ref 11.6–15.2)

## 2015-09-04 LAB — SURGICAL PCR SCREEN
MRSA, PCR: NEGATIVE
STAPHYLOCOCCUS AUREUS: NEGATIVE

## 2015-09-04 LAB — APTT: APTT: 40 s — AB (ref 24–37)

## 2015-09-11 MED ORDER — CEFAZOLIN SODIUM-DEXTROSE 2-3 GM-% IV SOLR
2.0000 g | INTRAVENOUS | Status: AC
  Administered 2015-09-12: 2 g via INTRAVENOUS
  Filled 2015-09-11: qty 50

## 2015-09-11 MED ORDER — CHLORHEXIDINE GLUCONATE 4 % EX LIQD: 60.0000 mL | Freq: Once | CUTANEOUS | Status: DC

## 2015-09-11 MED ORDER — TRANEXAMIC ACID 1000 MG/10ML IV SOLN
1000.0000 mg | INTRAVENOUS | Status: AC
  Administered 2015-09-12: 1000 mg via INTRAVENOUS
  Filled 2015-09-11: qty 10

## 2015-09-11 MED ORDER — LACTATED RINGERS IV SOLN: INTRAVENOUS | Status: DC

## 2015-09-12 ENCOUNTER — Encounter (HOSPITAL_COMMUNITY): Payer: Self-pay | Admitting: *Deleted

## 2015-09-12 ENCOUNTER — Inpatient Hospital Stay (HOSPITAL_COMMUNITY): Payer: Medicare Other | Admitting: Anesthesiology

## 2015-09-12 ENCOUNTER — Encounter (HOSPITAL_COMMUNITY)
Admission: RE | Disposition: A | Discharge status: Home / Self Care | Payer: Self-pay | Source: Ambulatory Visit | Attending: Orthopedic Surgery

## 2015-09-12 ENCOUNTER — Inpatient Hospital Stay (HOSPITAL_COMMUNITY)
Admission: RE | Admit: 2015-09-12 | Discharge: 2015-09-13 | DRG: 483 | Disposition: A | Discharge status: Home / Self Care | Payer: Medicare Other | Source: Ambulatory Visit | Attending: Orthopedic Surgery | Admitting: Orthopedic Surgery

## 2015-09-12 DIAGNOSIS — Z79899 Other long term (current) drug therapy: Secondary | ICD-10-CM

## 2015-09-12 DIAGNOSIS — Z96612 Presence of left artificial shoulder joint: Secondary | ICD-10-CM | POA: Diagnosis present

## 2015-09-12 DIAGNOSIS — K219 Gastro-esophageal reflux disease without esophagitis: Secondary | ICD-10-CM | POA: Diagnosis present

## 2015-09-12 DIAGNOSIS — I10 Essential (primary) hypertension: Secondary | ICD-10-CM | POA: Diagnosis not present

## 2015-09-12 DIAGNOSIS — G4730 Sleep apnea, unspecified: Secondary | ICD-10-CM | POA: Diagnosis present

## 2015-09-12 DIAGNOSIS — G8918 Other acute postprocedural pain: Secondary | ICD-10-CM | POA: Diagnosis not present

## 2015-09-12 DIAGNOSIS — Z7982 Long term (current) use of aspirin: Secondary | ICD-10-CM

## 2015-09-12 DIAGNOSIS — M19011 Primary osteoarthritis, right shoulder: Principal | ICD-10-CM | POA: Diagnosis present

## 2015-09-12 DIAGNOSIS — Z96611 Presence of right artificial shoulder joint: Secondary | ICD-10-CM

## 2015-09-12 DIAGNOSIS — Z96619 Presence of unspecified artificial shoulder joint: Secondary | ICD-10-CM

## 2015-09-12 HISTORY — PX: TOTAL SHOULDER ARTHROPLASTY: SHX126

## 2015-09-12 SURGERY — ARTHROPLASTY, SHOULDER, TOTAL
Anesthesia: Regional | Site: Shoulder | Laterality: Right

## 2015-09-12 MED ORDER — DIPHENHYDRAMINE HCL 12.5 MG/5ML PO ELIX: 12.5000 mg | ORAL_SOLUTION | ORAL | Status: DC | PRN

## 2015-09-12 MED ORDER — PROPOFOL 10 MG/ML IV BOLUS
INTRAVENOUS | Status: DC | PRN
  Administered 2015-09-12: 150 mg via INTRAVENOUS

## 2015-09-12 MED ORDER — BUPIVACAINE-EPINEPHRINE (PF) 0.5% -1:200000 IJ SOLN
INTRAMUSCULAR | Status: DC | PRN
Start: 2015-09-12 — End: 2015-09-12
  Administered 2015-09-12: 30 mL via PERINEURAL

## 2015-09-12 MED ORDER — OXYCODONE HCL 5 MG PO TABS
5.0000 mg | ORAL_TABLET | ORAL | Status: DC | PRN
  Administered 2015-09-12 (×2): 10 mg via ORAL
  Administered 2015-09-13: 5 mg via ORAL
  Administered 2015-09-13: 10 mg via ORAL
  Administered 2015-09-13 (×2): 5 mg via ORAL
  Filled 2015-09-12: qty 2
  Filled 2015-09-12: qty 1
  Filled 2015-09-12: qty 2
  Filled 2015-09-12 (×2): qty 1
  Filled 2015-09-12: qty 2

## 2015-09-12 MED ORDER — PHENOL 1.4 % MT LIQD: 1.0000 | OROMUCOSAL | Status: DC | PRN

## 2015-09-12 MED ORDER — FENTANYL CITRATE (PF) 100 MCG/2ML IJ SOLN: 25.0000 ug | INTRAMUSCULAR | Status: DC | PRN

## 2015-09-12 MED ORDER — ROCURONIUM BROMIDE 100 MG/10ML IV SOLN
INTRAVENOUS | Status: DC | PRN
  Administered 2015-09-12: 50 mg via INTRAVENOUS

## 2015-09-12 MED ORDER — ACETAMINOPHEN 650 MG RE SUPP
650.0000 mg | Freq: Four times a day (QID) | RECTAL | Status: DC | PRN
Start: 2015-09-12 — End: 2015-09-13

## 2015-09-12 MED ORDER — 0.9 % SODIUM CHLORIDE (POUR BTL) OPTIME
TOPICAL | Status: DC | PRN
  Administered 2015-09-12: 1000 mL

## 2015-09-12 MED ORDER — FENTANYL CITRATE (PF) 250 MCG/5ML IJ SOLN
INTRAMUSCULAR | Status: AC
  Filled 2015-09-12: qty 5

## 2015-09-12 MED ORDER — ROCURONIUM BROMIDE 50 MG/5ML IV SOLN
INTRAVENOUS | Status: AC
  Filled 2015-09-12: qty 1

## 2015-09-12 MED ORDER — METOCLOPRAMIDE HCL 5 MG PO TABS: 5.0000 mg | ORAL_TABLET | Freq: Three times a day (TID) | ORAL | Status: DC | PRN

## 2015-09-12 MED ORDER — MENTHOL 3 MG MT LOZG: 1.0000 | LOZENGE | OROMUCOSAL | Status: DC | PRN

## 2015-09-12 MED ORDER — ONDANSETRON HCL 4 MG/2ML IJ SOLN
INTRAMUSCULAR | Status: AC
  Filled 2015-09-12: qty 2

## 2015-09-12 MED ORDER — OXYCODONE HCL 5 MG/5ML PO SOLN: 5.0000 mg | Freq: Once | ORAL | Status: DC | PRN

## 2015-09-12 MED ORDER — BISACODYL 5 MG PO TBEC
5.0000 mg | DELAYED_RELEASE_TABLET | Freq: Every day | ORAL | Status: DC | PRN
Start: 2015-09-12 — End: 2015-09-13

## 2015-09-12 MED ORDER — HYDROCHLOROTHIAZIDE 25 MG PO TABS
12.5000 mg | ORAL_TABLET | Freq: Every day | ORAL | Status: DC
Start: 2015-09-13 — End: 2015-09-13
  Administered 2015-09-13: 12.5 mg via ORAL
  Filled 2015-09-12: qty 1

## 2015-09-12 MED ORDER — GLYCOPYRROLATE 0.2 MG/ML IJ SOLN
INTRAMUSCULAR | Status: DC | PRN
  Administered 2015-09-12: 0.4 mg via INTRAVENOUS

## 2015-09-12 MED ORDER — FLEET ENEMA 7-19 GM/118ML RE ENEM: 1.0000 | ENEMA | Freq: Once | RECTAL | Status: DC | PRN

## 2015-09-12 MED ORDER — TAMSULOSIN HCL 0.4 MG PO CAPS
0.4000 mg | ORAL_CAPSULE | Freq: Every day | ORAL | Status: DC
  Filled 2015-09-12: qty 1

## 2015-09-12 MED ORDER — PROPOFOL 10 MG/ML IV BOLUS
INTRAVENOUS | Status: AC
  Filled 2015-09-12: qty 20

## 2015-09-12 MED ORDER — ONDANSETRON HCL 4 MG PO TABS: 4.0000 mg | ORAL_TABLET | Freq: Four times a day (QID) | ORAL | Status: DC | PRN

## 2015-09-12 MED ORDER — ONDANSETRON HCL 4 MG/2ML IJ SOLN
4.0000 mg | Freq: Four times a day (QID) | INTRAMUSCULAR | Status: DC | PRN
  Administered 2015-09-12: 4 mg via INTRAVENOUS
  Filled 2015-09-12: qty 2

## 2015-09-12 MED ORDER — SODIUM CHLORIDE 0.9 % IV SOLN
10.0000 mg | INTRAVENOUS | Status: DC | PRN
  Administered 2015-09-12: 25 ug/min via INTRAVENOUS

## 2015-09-12 MED ORDER — DOCUSATE SODIUM 100 MG PO CAPS
100.0000 mg | ORAL_CAPSULE | Freq: Two times a day (BID) | ORAL | Status: DC
  Administered 2015-09-12 – 2015-09-13 (×2): 100 mg via ORAL
  Filled 2015-09-12 (×2): qty 1

## 2015-09-12 MED ORDER — HYDROMORPHONE HCL 1 MG/ML IJ SOLN: 1.0000 mg | INTRAMUSCULAR | Status: DC | PRN

## 2015-09-12 MED ORDER — POLYETHYLENE GLYCOL 3350 17 G PO PACK: 17.0000 g | PACK | Freq: Every day | ORAL | Status: DC | PRN

## 2015-09-12 MED ORDER — OXYCODONE HCL 5 MG PO TABS: 5.0000 mg | ORAL_TABLET | Freq: Once | ORAL | Status: DC | PRN

## 2015-09-12 MED ORDER — LACTATED RINGERS IV SOLN
INTRAVENOUS | Status: DC | PRN
  Administered 2015-09-12: 07:00:00 via INTRAVENOUS

## 2015-09-12 MED ORDER — FLUTICASONE PROPIONATE 50 MCG/ACT NA SUSP
1.0000 | Freq: Every day | NASAL | Status: DC | PRN
  Filled 2015-09-12: qty 16

## 2015-09-12 MED ORDER — CEFAZOLIN SODIUM-DEXTROSE 2-3 GM-% IV SOLR
2.0000 g | Freq: Four times a day (QID) | INTRAVENOUS | Status: AC
  Administered 2015-09-12 – 2015-09-13 (×3): 2 g via INTRAVENOUS
  Filled 2015-09-12 (×3): qty 50

## 2015-09-12 MED ORDER — LIDOCAINE HCL (CARDIAC) 20 MG/ML IV SOLN
INTRAVENOUS | Status: DC | PRN
  Administered 2015-09-12: 60 mg via INTRAVENOUS

## 2015-09-12 MED ORDER — ONDANSETRON HCL 4 MG/2ML IJ SOLN
INTRAMUSCULAR | Status: DC | PRN
  Administered 2015-09-12: 4 mg via INTRAVENOUS

## 2015-09-12 MED ORDER — DIAZEPAM 5 MG PO TABS
5.0000 mg | ORAL_TABLET | Freq: Four times a day (QID) | ORAL | Status: DC | PRN
  Administered 2015-09-12: 5 mg via ORAL
  Filled 2015-09-12: qty 1

## 2015-09-12 MED ORDER — KETOROLAC TROMETHAMINE 15 MG/ML IJ SOLN
7.5000 mg | Freq: Four times a day (QID) | INTRAMUSCULAR | Status: AC
  Administered 2015-09-13 (×2): 7.5 mg via INTRAVENOUS
  Filled 2015-09-12 (×3): qty 1

## 2015-09-12 MED ORDER — LACTATED RINGERS IV SOLN
INTRAVENOUS | Status: DC
  Administered 2015-09-13: 06:00:00 via INTRAVENOUS

## 2015-09-12 MED ORDER — ALUM & MAG HYDROXIDE-SIMETH 200-200-20 MG/5ML PO SUSP: 30.0000 mL | ORAL | Status: DC | PRN

## 2015-09-12 MED ORDER — NEOSTIGMINE METHYLSULFATE 10 MG/10ML IV SOLN
INTRAVENOUS | Status: DC | PRN
  Administered 2015-09-12: 3 mg via INTRAVENOUS

## 2015-09-12 MED ORDER — LIDOCAINE HCL (CARDIAC) 20 MG/ML IV SOLN
INTRAVENOUS | Status: AC
  Filled 2015-09-12: qty 5

## 2015-09-12 MED ORDER — MIDAZOLAM HCL 2 MG/2ML IJ SOLN
INTRAMUSCULAR | Status: AC
  Filled 2015-09-12: qty 2

## 2015-09-12 MED ORDER — METOCLOPRAMIDE HCL 5 MG/ML IJ SOLN
5.0000 mg | Freq: Three times a day (TID) | INTRAMUSCULAR | Status: DC | PRN
  Administered 2015-09-12: 10 mg via INTRAVENOUS
  Filled 2015-09-12: qty 2

## 2015-09-12 MED ORDER — ONDANSETRON HCL 4 MG/2ML IJ SOLN: 4.0000 mg | Freq: Four times a day (QID) | INTRAMUSCULAR | Status: DC | PRN

## 2015-09-12 MED ORDER — VERAPAMIL HCL ER 240 MG PO TBCR
240.0000 mg | EXTENDED_RELEASE_TABLET | Freq: Every day | ORAL | Status: DC
  Administered 2015-09-12: 240 mg via ORAL
  Filled 2015-09-12 (×2): qty 1

## 2015-09-12 MED ORDER — GLYCOPYRROLATE 0.2 MG/ML IJ SOLN
INTRAMUSCULAR | Status: AC
  Filled 2015-09-12: qty 2

## 2015-09-12 MED ORDER — ACETAMINOPHEN 325 MG PO TABS
650.0000 mg | ORAL_TABLET | Freq: Four times a day (QID) | ORAL | Status: DC | PRN
Start: 2015-09-12 — End: 2015-09-13

## 2015-09-12 MED ORDER — FENTANYL CITRATE (PF) 100 MCG/2ML IJ SOLN
INTRAMUSCULAR | Status: DC | PRN
  Administered 2015-09-12 (×2): 50 ug via INTRAVENOUS

## 2015-09-12 SURGICAL SUPPLY — 64 items
BLADE SAW SGTL 18X1.27X75 (BLADE) ×2 IMPLANT
BLADE SAW SGTL 83.5X18.5 (BLADE) ×2 IMPLANT
CEMENT BONE DEPUY (Cement) ×2 IMPLANT
COVER SURGICAL LIGHT HANDLE (MISCELLANEOUS) ×2 IMPLANT
DERMABOND ADHESIVE PROPEN (GAUZE/BANDAGES/DRESSINGS) ×1
DERMABOND ADVANCED .7 DNX6 (GAUZE/BANDAGES/DRESSINGS) ×1 IMPLANT
DRAPE ORTHO SPLIT 77X108 STRL (DRAPES) ×2
DRAPE SURG 17X11 SM STRL (DRAPES) ×2 IMPLANT
DRAPE SURG ORHT 6 SPLT 77X108 (DRAPES) ×2 IMPLANT
DRAPE U-SHAPE 47X51 STRL (DRAPES) ×2 IMPLANT
DRILL BIT 7/64X5 (BIT) IMPLANT
DRSG AQUACEL AG ADV 3.5X10 (GAUZE/BANDAGES/DRESSINGS) ×2 IMPLANT
DURAPREP 26ML APPLICATOR (WOUND CARE) ×2 IMPLANT
ELECT BLADE 4.0 EZ CLEAN MEGAD (MISCELLANEOUS) ×2
ELECT CAUTERY BLADE 6.4 (BLADE) ×2 IMPLANT
ELECT REM PT RETURN 9FT ADLT (ELECTROSURGICAL) ×2
ELECTRODE BLDE 4.0 EZ CLN MEGD (MISCELLANEOUS) ×1 IMPLANT
ELECTRODE REM PT RTRN 9FT ADLT (ELECTROSURGICAL) ×1 IMPLANT
FACESHIELD WRAPAROUND (MASK) ×6 IMPLANT
GLENOID UNI VAULTLOCK LRG (Shoulder) ×2 IMPLANT
GLOVE BIO SURGEON STRL SZ7.5 (GLOVE) ×2 IMPLANT
GLOVE BIO SURGEON STRL SZ8 (GLOVE) ×2 IMPLANT
GLOVE EUDERMIC 7 POWDERFREE (GLOVE) ×2 IMPLANT
GLOVE SS BIOGEL STRL SZ 7.5 (GLOVE) ×1 IMPLANT
GLOVE SUPERSENSE BIOGEL SZ 7.5 (GLOVE) ×1
GOWN STRL REUS W/ TWL LRG LVL3 (GOWN DISPOSABLE) ×1 IMPLANT
GOWN STRL REUS W/ TWL XL LVL3 (GOWN DISPOSABLE) ×2 IMPLANT
GOWN STRL REUS W/TWL LRG LVL3 (GOWN DISPOSABLE) ×2
GOWN STRL REUS W/TWL XL LVL3 (GOWN DISPOSABLE) ×2
HEAD HUMERAL UNIVERSAL II 52 (Shoulder) ×2 IMPLANT
KIT BASIN OR (CUSTOM PROCEDURE TRAY) ×2 IMPLANT
KIT ROOM TURNOVER OR (KITS) ×2 IMPLANT
KIT SET UNIVERSAL (KITS) ×2 IMPLANT
MANIFOLD NEPTUNE II (INSTRUMENTS) ×2 IMPLANT
NDL SUT .5 MAYO 1.404X.05X (NEEDLE) ×1 IMPLANT
NEEDLE HYPO 25GX1X1/2 BEV (NEEDLE) IMPLANT
NEEDLE MAYO TAPER (NEEDLE) ×1
NS IRRIG 1000ML POUR BTL (IV SOLUTION) ×2 IMPLANT
PACK SHOULDER (CUSTOM PROCEDURE TRAY) ×2 IMPLANT
PAD ARMBOARD 7.5X6 YLW CONV (MISCELLANEOUS) ×4 IMPLANT
PASSER SUT SWANSON 36MM LOOP (INSTRUMENTS) IMPLANT
RESTRAINT HEAD UNIVERSAL NS (MISCELLANEOUS) ×2 IMPLANT
SLING ARM FOAM STRAP LRG (SOFTGOODS) IMPLANT
SLING ARM XL FOAM STRAP (SOFTGOODS) ×2 IMPLANT
SMARTMIX MINI TOWER (MISCELLANEOUS) ×2
SPONGE LAP 18X18 X RAY DECT (DISPOSABLE) IMPLANT
SPONGE LAP 4X18 X RAY DECT (DISPOSABLE) ×2 IMPLANT
STEM HUMERAL UNI APEX 10MM (Shoulder) ×2 IMPLANT
SUCTION FRAZIER HANDLE 10FR (MISCELLANEOUS) ×1
SUCTION TUBE FRAZIER 10FR DISP (MISCELLANEOUS) ×1 IMPLANT
SUT BONE WAX W31G (SUTURE) IMPLANT
SUT FIBERWIRE #2 38 T-5 BLUE (SUTURE) ×4
SUT MNCRL AB 3-0 PS2 18 (SUTURE) ×2 IMPLANT
SUT MON AB 2-0 CT1 36 (SUTURE) ×2 IMPLANT
SUT VIC AB 1 CT1 27 (SUTURE) ×4
SUT VIC AB 1 CT1 27XBRD ANBCTR (SUTURE) ×2 IMPLANT
SUT VIC AB 2-0 CT1 27 (SUTURE) ×1
SUT VIC AB 2-0 CT1 TAPERPNT 27 (SUTURE) ×1 IMPLANT
SUTURE FIBERWR #2 38 T-5 BLUE (SUTURE) ×2 IMPLANT
SYR CONTROL 10ML LL (SYRINGE) IMPLANT
TOWEL OR 17X24 6PK STRL BLUE (TOWEL DISPOSABLE) ×2 IMPLANT
TOWEL OR 17X26 10 PK STRL BLUE (TOWEL DISPOSABLE) ×2 IMPLANT
TOWER SMARTMIX MINI (MISCELLANEOUS) ×1 IMPLANT
WATER STERILE IRR 1000ML POUR (IV SOLUTION) ×2 IMPLANT

## 2015-09-12 NOTE — Op Note (Signed)
NAMEKHODI, BENYO NO.:  [id number]  MEDICAL RECORD NO.:  [id number]  LOCATION:  [facility]                         FACILITY:  [facility]  PHYSICIAN:  Metta Clines. Holley Kocurek, M.D.  DATE OF BIRTH:  [birth year]  DATE OF PROCEDURE:  09/12/2015 DATE OF DISCHARGE:                              OPERATIVE REPORT   PREOPERATIVE DIAGNOSIS:  End-stage right shoulder osteoarthritis.  POSTOPERATIVE DIAGNOSIS:  End-stage right shoulder osteoarthritis.  PROCEDURE:  Right total shoulder arthroplasty utilizing a press-fit size 10 Arthrex stem with a 52 x 22 head and a cemented pegged large glenoid.  SURGEON:  Metta Clines. Bow Buntyn, M.D.  Terrence DupontOlivia Mackie A. Shuford, P.A.-C.  ANESTHESIA:  General endotracheal as well as interscalene block.  ESTIMATED BLOOD LOSS:  200 mL.  DRAINS:  None.  HISTORY:  Mr. Macri is a 81 year old gentleman, who has had chronic bilateral shoulder pain status post left total shoulder arthroplasty performed several years ago.  He is doing very well with his left shoulder.  Right shoulder has now become increasingly painful  related to his end-stage osteoarthritis and he is brought to the operating room at this time for planned right total shoulder arthroplasty.  Preoperatively, I counseled Mr. Denno regarding treatment options and potential risks versus benefits thereof.  Possible surgical complications were all reviewed including bleeding, infection, vascular injury, persistent pain, loss of motion, anesthetic complication, failure of the implant, and possible need for additional surgery.  He understands and accepts and agrees with our planned procedure.  PROCEDURE IN DETAIL:  After undergoing routine preop evaluation, the patient received prophylactic antibiotics and an interscalene block was established in the holding area with the Anesthesia Department.  Placed supine on the operating table and underwent smooth induction of a general endotracheal  anesthesia.  Placed in a beach-chair position and appropriately padded and protected.  The right shoulder girdle region was then sterilely prepped and draped in standard fashion.  Time-out was called.  An anterior deltopectoral approach to the right shoulder was made through an 8-cm anterior incision.  Skin flaps were elevated and electrocautery was used for hemostasis.  The deltopectoral interval was then developed and vein taken laterally.  The upper centimeter of the pectoralis major was tenotomized to enhance exposure.  The conjoined tendon was mobilized and retracted medially.  Adhesions divided beneath the deltoid and Browne retractor was placed.  We then unroofed the biceps tendon and tenotomized for later tenodesis and then we split the rotator cuff along the rotator interval from the bicipital groove to the base of the coracoid and then separated the subscapularis from the lesser tuberosity leaving a 1 centimeter cuff of tissue for later repair.  The free margin was then tagged with a series of #2 grasping FiberWire sutures.  We then divided the capsular tissues from anteroinferior and inferior aspects of the humeral head.  I should note that there was a very prominent inferior and anterior inferior osteophyte, as well as a large loose body, and we used a rongeur to remove the osteophyte to help improve our exposure and remove the large loose body anteroinferiorly.  We then completed our capsular release allowing [place] to dislocate the head.  At this point, we used an extramedullary guide to perform a humeral head resection at the appropriate level mimicking the native retroversion which was approximately 35 degrees.  We then used an oscillating saw to resect the head carefully protecting the surrounding rotator cuff.  At this point, we then completed removal of the osteophyte margins of the humeral metaphysis.  The humerus was then prepared with hand reaming up to size 6 and we  broached replicating the native retroversion.  We broached up to a size 10 with excellent fit and fixation.  Then, we placed a size 9 stem with a metal cap over the cut surface of the metaphysis and at this point exposed the glenoid with combination of Fukuda, pitchfork, and snake tongue retractors.  I performed a circumferential labral resection gaining complete visualization of the periphery of the glenoid and performed a capsular release anteriorly confirming our subscap was properly mobilized.  Once we gained complete visualization, I placed a guide pin in the center of the glenoid and determined a large glenoid with appropriate fit.  We then reamed the glenoid to a subchondral stable bony bed and performed the central drilling and peripheral PEG hole placement using proper broaches.  A trial glenoid showed excellent fit and fixation.  At this point, the glenoid was irrigated, cleaned and dried.  We mixed cement in appropriate consistency, it was introduced into the superior and inferior PEG and slot respectively.  The final glenoid was then impacted in position with excellent fit and fixation. At this point, we then returned our attention to the humerus where we irrigated the canal, we placed our size 10 stem and impacted this at appropriate level with proper fit and fixation and then tightened the fixation screws managing our version and angulation and these were all tight much to our satisfaction.  We then performed a series of trial reductions and felt that the 52 x 22 head gave [place] the best soft tissue balance with good coverage of the metaphysis.  The final 52 x 22 head was then impacted on the clean and dried Morse taper.  Final reduction was performed.  The overall soft tissue mobility was much to our satisfaction with 50% translation of the humeral head and glenoid.  We then repaired the subscapularis back to the lesser tuberosity with #2 FiberWire and closed the rotator  interval with a pair of figure-of-eight #2 FiberWires and this was oversewn and reinforced with several additional FiberWires to nicely close the rotator interval and the subscap repair.  We then performed a tenodesis of the long head biceps tendon at the upper border of the pectoralis major.  The wound was then copiously irrigated and hemostasis obtained.  We again took the shoulder through range of motion, easily achieved 45 degrees of external rotation without excessive tension on the subscap repair.  At this point, the deltopectoral interval was then reapproximated with a series of figure- of-eight #1 Vicryl sutures, 2-0 Vicryl was used for subcu layer and intracuticular 3-0 Monocryl for the skin, followed by Dermabond and Aquacel dressing.  Right arm was placed into a sling and the patient was awakened, extubated, and taken to recovery room in stable condition.  Tracy A. Shuford, P.A.-C was used as an Environmental consultant throughout this case and essential for help with positioning the patient, positioning the extremity, management of the retractors, tissue manipulation, implantation of prosthesis, wound closure, and intraoperative decision making.     Metta Clines. Irelynn Schermerhorn, M.D.     KMS/MEDQ  D:  09/12/2015  T:  09/12/2015  Job:  [id number]

## 2015-09-12 NOTE — Progress Notes (Signed)
Utilization review completed.  

## 2015-09-12 NOTE — Discharge Instructions (Signed)

## 2015-09-12 NOTE — Transfer of Care (Signed)
Immediate Anesthesia Transfer of Care Note  Patient: Scott Zamora  Procedure(s) Performed: Procedure(s): RIGHT TOTAL SHOULDER ARTHROPLASTY (Right)  Patient Location: PACU  Anesthesia Type:GA combined with regional for post-op pain  Level of Consciousness: awake, alert  and oriented  Airway & Oxygen Therapy: Patient Spontanous Breathing and Patient connected to nasal cannula oxygen  Post-op Assessment: Report given to RN, Post -op Vital signs reviewed and stable and Patient moving all extremities  Post vital signs: Reviewed and stable  Last Vitals:  Filed Vitals:   09/12/15 0611  BP: 151/87  Pulse: 59  Temp: 36.9 C  Resp: 18    Complications: No apparent anesthesia complications

## 2015-09-12 NOTE — Op Note (Signed)
09/12/2015  9:29 AM  PATIENT:   Scott Zamora  81 y.o. male  PRE-OPERATIVE DIAGNOSIS:  RIGHT SHOULDER OA   POST-OPERATIVE DIAGNOSIS:  same  PROCEDURE:  R TSA #10 stem, 52x22 head, large glenoid  SURGEON:  Anayia Eugene, Metta Clines M.D.  ASSISTANTS: Shuford pac   ANESTHESIA:   GET + ISB  EBL: 200  SPECIMEN:  none  Drains: none   PATIENT DISPOSITION:  PACU - hemodynamically stable.    PLAN OF CARE: Admit for overnight observation  Dictation# [id number]   Contact # [phone]

## 2015-09-12 NOTE — H&P (Signed)
Berish E Goar    Chief Complaint: RIGHT SHOULDER OA  HPI: The patient is a 81 y.o. male with end stage right shoulder OA  Past Medical History  Diagnosis Date  . Hypertension   . Vertigo     hx of  . Sleep apnea     has CPAP but does not wear it  . Seasonal allergies   . Arthritis   . Dry skin   . Inguinal hernia, left   . GERD (gastroesophageal reflux disease)     Past Surgical History  Procedure Laterality Date  . Knee arthroscopy      left knee  . Colonoscopy w/ polypectomy    . Total shoulder arthroplasty  08/11/2012    Dr Justice Britain  . Total shoulder arthroplasty  08/11/2012    Procedure: TOTAL SHOULDER ARTHROPLASTY;  Surgeon: Marin Shutter, MD;  Location: [facility];  Service: Orthopedics;  Laterality: Left;  . Inguinal hernia repair Left 04/18/2014    Procedure: HERNIA REPAIR INGUINAL ADULT;  Surgeon: Jamesetta So, MD;  Location: [facility];  Service: General;  Laterality: Left;  . Insertion of mesh Left 04/18/2014    Procedure: INSERTION OF MESH;  Surgeon: Jamesetta So, MD;  Location: [facility];  Service: General;  Laterality: Left;  . Hernia repair    . Eye surgery Bilateral     History reviewed. No pertinent family history.  Social History:  reports that he has never smoked. He has never used smokeless tobacco. He reports that he does not drink alcohol or use illicit drugs.   Medications Prior to Admission  Medication Sig Dispense Refill  . acetaminophen (TYLENOL) 500 MG tablet Take 500 mg by mouth at bedtime.    Marland Kitchen aspirin EC 81 MG tablet Take 81 mg by mouth daily.    . Cholecalciferol (VITAMIN D-3) 1000 UNITS CAPS Take 1,000 Units by mouth daily.    . Digestive Enzymes (PAPAYA AND ENZYMES PO) Take 2 tablets by mouth daily as needed (for digestive support).     . flunisolide (NASALIDE) 25 MCG/ACT (0.025%) SOLN Inhale 2 sprays into the lungs daily as needed. For congestion.    . hydrochlorothiazide (HYDRODIURIL) 25 MG tablet Take 12.5 mg by mouth daily.    Marland Kitchen  loratadine (CLARITIN) 10 MG tablet Take 10 mg by mouth daily as needed. For allergies    . naproxen (NAPROSYN) 500 MG tablet Take 500 mg by mouth 2 (two) times daily as needed. For pain    . POTASSIUM PO Take 1 tablet by mouth daily. OTC.    . sildenafil (VIAGRA) 100 MG tablet Take 50 mg by mouth daily as needed. For erectile dysfunction    . tamsulosin (FLOMAX) 0.4 MG CAPS capsule Take 0.4 mg by mouth daily after supper.    . TURMERIC PO Take 300 mg by mouth daily.    . verapamil (CALAN-SR) 240 MG CR tablet Take 240 mg by mouth at bedtime.    . vitamin B-12 (CYANOCOBALAMIN) 250 MCG tablet Take 250 mcg by mouth 2 (two) times a week.     . COD LIVER OIL PO Take 1 tablet by mouth daily as needed (for supplementation).     . Coenzyme Q10 (COQ-10 PO) Take 1 tablet by mouth daily.       Physical Exam: right shoulder with painful and restricted motion as noted at recent office visits  Vitals  Temp:  [98.4 F (36.9 C)] 98.4 F (36.9 C) (03/02 0611) Pulse Rate:  [59] 59 (  03/02 0611) Resp:  [18] 18 (03/02 0611) BP: (151)/(87) 151/87 mmHg (03/02 0611) SpO2:  [98 %] 98 % (03/02 0611)  Assessment/Plan  Impression: RIGHT SHOULDER OA   Plan of Action: Procedure(s): RIGHT TOTAL SHOULDER ARTHROPLASTY  Maximos Zayas M Miron Marxen 09/12/2015, 7:25 AM Contact # [phone]

## 2015-09-12 NOTE — Anesthesia Preprocedure Evaluation (Addendum)
Anesthesia Evaluation  Patient identified by MRN, date of birth, ID band Patient awake    Reviewed: Allergy & Precautions, NPO status , Patient's Chart, lab work & pertinent test results  Airway Mallampati: II   Neck ROM: full    Dental  (+) Teeth Intact   Pulmonary sleep apnea ,    breath sounds clear to auscultation       Cardiovascular hypertension,  Rhythm:regular Rate:Normal     Neuro/Psych    GI/Hepatic GERD  ,  Endo/Other    Renal/GU      Musculoskeletal  (+) Arthritis ,   Abdominal   Peds  Hematology   Anesthesia Other Findings   Reproductive/Obstetrics                           Anesthesia Physical Anesthesia Plan  ASA: II  Anesthesia Plan: General and Regional   Post-op Pain Management: MAC Combined w/ Regional for Post-op pain   Induction: Intravenous  Airway Management Planned: Oral ETT  Additional Equipment:   Intra-op Plan:   Post-operative Plan: Extubation in OR  Informed Consent: I have reviewed the patients History and Physical, chart, labs and discussed the procedure including the risks, benefits and alternatives for the proposed anesthesia with the patient or authorized representative who has indicated his/her understanding and acceptance.     Plan Discussed with: CRNA, Anesthesiologist and Surgeon  Anesthesia Plan Comments:         Anesthesia Quick Evaluation

## 2015-09-12 NOTE — Anesthesia Procedure Notes (Addendum)
Procedure Name: Intubation Date/Time: 09/12/2015 7:38 AM Performed by: Kyung Rudd Pre-anesthesia Checklist: Patient identified, Emergency Drugs available, Suction available, Patient being monitored and Timeout performed Patient Re-evaluated:Patient Re-evaluated prior to inductionOxygen Delivery Method: Circle system utilized Preoxygenation: Pre-oxygenation with 100% oxygen Intubation Type: IV induction Ventilation: Mask ventilation without difficulty Laryngoscope Size: Mac and 4 Grade View: Grade III Tube type: Oral Tube size: 7.5 mm Number of attempts: 1 Airway Equipment and Method: Stylet Placement Confirmation: positive ETCO2 and breath sounds checked- equal and bilateral Secured at: 22 cm Tube secured with: Tape Dental Injury: Teeth and Oropharynx as per pre-operative assessment    Anesthesia Regional Block:  Interscalene brachial plexus block  Pre-Anesthetic Checklist: ,, timeout performed, Correct Patient, Correct Site, Correct Laterality, Correct Procedure, Correct Position, site marked, Risks and benefits discussed,  Surgical consent,  Pre-op evaluation,  At surgeon's request and post-op pain management  Laterality: Right  Prep: chloraprep       Needles:  Injection technique: Single-shot  Needle Type: Echogenic Stimulator Needle     Needle Length: 5cm 5 cm Needle Gauge: 22 and 22 G    Additional Needles:  Procedures: ultrasound guided (picture in chart) and nerve stimulator Interscalene brachial plexus block  Nerve Stimulator or Paresthesia:  Response: biceps flexion, 0.45 mA,   Additional Responses:   Narrative:  Start time: 09/12/2015 7:09 AM End time: 09/12/2015 7:15 AM Injection made incrementally with aspirations every 5 mL.  Performed by: Personally  Anesthesiologist: Luwanda Starr  Additional Notes: Functioning IV was confirmed and monitors were applied.  A 22mm 22ga Arrow echogenic stimulator needle was used. Sterile prep and drape,hand hygiene  and sterile gloves were used.  Negative aspiration and negative test dose prior to incremental administration of local anesthetic. The patient tolerated the procedure well.  Ultrasound guidance: relevent anatomy identified, needle position confirmed, local anesthetic spread visualized around nerve(s), vascular puncture avoided.  Image printed for medical record.

## 2015-09-13 ENCOUNTER — Encounter (HOSPITAL_COMMUNITY): Payer: Self-pay | Admitting: Orthopedic Surgery

## 2015-09-13 MED ORDER — OXYCODONE-ACETAMINOPHEN 5-325 MG PO TABS: 1.0000 | ORAL_TABLET | ORAL | Status: DC | PRN

## 2015-09-13 MED ORDER — DIAZEPAM 2 MG PO TABS: 2.0000 mg | ORAL_TABLET | Freq: Four times a day (QID) | ORAL | Status: AC | PRN

## 2015-09-13 NOTE — Progress Notes (Signed)
Occupational Therapy Evaluation Patient Details Name: Scott Zamora MRN: [id number] DOB: Feb 27, 1935 Today's Date: 09/13/2015    History of Present Illness 81 y.o. male s/p R total shoulder arthroplasty. PMH significant for HTN, arthritis, GERD, inguinal hernia, vertigo, sleep apnea, L knee arthroscopy, and L TSA (2014).   Clinical Impression   PTA, pt was independent with ADLs and occasionally used [facility] for community mobility. Pt currently requires min assist for UB ADLs and min guard assist for functional transfers and ambulation. Began education on shoulder precautions, sling wear protocol, positioning for sleep/rest, compensatory strategies for bathing/dressing, and appropriate use of 3in1 for transfers with pt and his wife. Pt plans to d/c home with 24/7 assistance from his wife and daughter. Pt will benefit from continued acute OT to increase independence and safety with ADLs and mobility to allow for safe discharge home. Recommend OP OT at discretion of MD at follow up appointment and 3in1 for home transfer use.    Follow Up Recommendations  Outpatient OT (progress shoulder rehab at MD discretion)    Equipment Recommendations  3 in 1 bedside comode    Recommendations for Other Services       Precautions / Restrictions Precautions Precautions: Shoulder Type of Shoulder Precautions: Passive protocol Shoulder Interventions: Shoulder sling/immobilizer;Off for dressing/bathing/exercises (OK to remove when sitting in recliner) Precaution Booklet Issued: Yes (comment) Precaution Comments: No shoulder AROM; PROM shoulder FF 90, ABD 60, ER 30; elbow/wrist/hand AROM ok,  Required Braces or Orthoses: Sling Restrictions Weight Bearing Restrictions: Yes RUE Weight Bearing: Non weight bearing      Mobility Bed Mobility               General bed mobility comments: Pt up in chair on OT arrival. Educated pt that it may be easier to exit bed on L side.  Transfers Overall  transfer level: Needs assistance Equipment used: None Transfers: Sit to/from Stand Sit to Stand: Min guard         General transfer comment: Min guard for safety. No physical assist required and no overt LOB, but pt taking some staggering steps at times.    Balance Overall balance assessment: Needs assistance Sitting-balance support: No upper extremity supported;Feet supported Sitting balance-Leahy Scale: Good     Standing balance support: No upper extremity supported;During functional activity Standing balance-Leahy Scale: Good                              ADL Overall ADL's : Needs assistance/impaired     Grooming: Wash/dry hands;Min guard;Standing           Upper Body Dressing : Minimal assistance;Cueing for compensatory techniques;Sitting;Cueing for UE precautions Upper Body Dressing Details (indicate cue type and reason): Cues to avoid tugging on arm to re-adjust sling; cues for strategies to re-adjust and position RUE in sling  Lower Body Dressing: Minimal assistance;Sit to/from stand   Toilet Transfer: Min guard;Ambulation;Comfort height toilet   Toileting- Clothing Manipulation and Hygiene: Min guard;Sit to/from stand       Functional mobility during ADLs: Min guard General ADL Comments: Educated pt on sling wear protocol, positioning for sleep/rest, compensatory strategies for bathing/dressing, and use of 3in1 over toilet and as shower seat. Pt's wife and daughter present for OT eval and very supportive     Vision Vision Assessment?: No apparent visual deficits   Perception     Praxis      Pertinent Vitals/Pain Pain Assessment: No/denies pain  Hand Dominance Right   Extremity/Trunk Assessment Upper Extremity Assessment Upper Extremity Assessment: RUE deficits/detail RUE Deficits / Details: decreased ROM and strength as expected post op RUE: Unable to fully assess due to immobilization   Lower Extremity Assessment Lower Extremity  Assessment: Overall WFL for tasks assessed   Cervical / Trunk Assessment Cervical / Trunk Assessment: Normal   Communication Communication Communication: No difficulties   Cognition Arousal/Alertness: Lethargic;Suspect due to medications Behavior During Therapy: WFL for tasks assessed/performed Overall Cognitive Status: Within Functional Limits for tasks assessed                     General Comments       Exercises Exercises: Shoulder     Shoulder Instructions Shoulder Instructions Donning/doffing shirt without moving shoulder: Minimal assistance Method for sponge bathing under operated UE: Minimal assistance Donning/doffing sling/immobilizer: Minimal assistance Correct positioning of sling/immobilizer: Minimal assistance Sling wearing schedule (on at all times/off for ADL's): Modified independent Proper positioning of operated UE when showering: Modified independent Positioning of UE while sleeping: Minimal assistance    Home Living Family/patient expects to be discharged to:: Private residence Living Arrangements: Spouse/significant other Available Help at Discharge: Family;Available 24 hours/day Type of Home: House Home Access: Stairs to enter [employer] of Steps: 2 Entrance Stairs-Rails: Right;Left Home Layout: One level     Bathroom Shower/Tub: Tub/shower unit;Curtain;Walk-in shower;Door Shower/tub characteristics: Architectural technologist: Handicapped height Bathroom Accessibility: Yes How Accessible: Accessible via walker Home Equipment: Hand held shower head;Walker - 2 wheels;Cane - single point          Prior Functioning/Environment Level of Independence: Needs assistance  Gait / Transfers Assistance Needed: ocassionally used [facility] for community mobility ADL's / Homemaking Assistance Needed: occasional assistance with dressing due to shoulder pain        OT Diagnosis: Acute pain   OT Problem List: Decreased strength;Decreased range  of motion;Decreased activity tolerance;Impaired balance (sitting and/or standing);Decreased coordination;Decreased safety awareness;Decreased knowledge of use of DME or AE;Decreased knowledge of precautions;Pain;Impaired UE functional use   OT Treatment/Interventions: Self-care/ADL training;Therapeutic exercise;Energy conservation;DME and/or AE instruction;Therapeutic activities;Patient/family education;Balance training    OT Goals(Current goals can be found in the care plan section) Acute Rehab OT Goals Patient Stated Goal: to go home today OT Goal Formulation: With patient Time For Goal Achievement: 09/27/15 Potential to Achieve Goals: Good ADL Goals Pt Will Perform Upper Body Bathing: with min assist;sitting Pt Will Perform Upper Body Dressing: with min assist;sitting Pt Will Perform Tub/Shower Transfer: Tub transfer;with supervision;ambulating;3 in 1 Pt/caregiver will Perform Home Exercise Program: Increased ROM;Right Upper extremity;With Supervision;With written HEP provided  OT Frequency: Min 2X/week   Barriers to D/C:            Co-evaluation              End of Session Equipment Utilized During Treatment: Gait belt;Other (comment) (sling) Nurse Communication: Mobility status;Weight bearing status;Precautions  Activity Tolerance: Patient tolerated treatment well Patient left: in chair;with call bell/phone within reach;with family/visitor present   Time: 1031-1106 OT Time Calculation (min): 35 min Charges:  OT General Charges $OT Visit: 1 Procedure OT Evaluation $OT Eval Moderate Complexity: 1 Procedure OT Treatments $Self Care/Home Management : 8-22 mins G-Codes:    Redmond Baseman, OTR/L Pager: [phone] 09/13/2015, 1:30 PM

## 2015-09-13 NOTE — Progress Notes (Signed)
Patient attempted to void and put out 10 cc urine. Bladder scan shows 45 cc. Orthopedic office notified. Awaiting call from MD. Will continue to monitor. - Roselyn Reef Winslow Ederer,RN

## 2015-09-13 NOTE — Progress Notes (Signed)
Patient is still due to void post In and Out cath early this morning. Patient states he does not feel the urge just yet. Stated he just ate breakfast and starting drinking fluids.  Will continue to monitor. Roselyn Reef Taiyo Kozma,RN

## 2015-09-13 NOTE — Progress Notes (Signed)
Patient only been able to void scant amount with frequency. Bladder scan showed ~999. In and out cath done got out 1073ml of urine. Patient tolerated well. Will continue to monitor.

## 2015-09-13 NOTE — Progress Notes (Signed)
Patient discharged per orders. D/C instructions discussed with patient and wife including prescriptions, medications, wound care/shoulder care, exercises, follow up appointments. Time allowed for questions and concerns, patient and his wife stated they had none. Patient taken to the main entrance via wheelchair accompanied by wife and daughter.  Stann Ore

## 2015-09-13 NOTE — Discharge Summary (Signed)
PATIENT ID:      Scott Zamora  MRN:     [id number] DOB/AGE:    79-Aug-1936 / 79 y.o.     DISCHARGE SUMMARY  ADMISSION DATE:    09/12/2015 DISCHARGE DATE:    ADMISSION DIAGNOSIS: RIGHT SHOULDER OA  Past Medical History  Diagnosis Date  . Hypertension   . Vertigo     hx of  . Sleep apnea     has CPAP but does not wear it  . Seasonal allergies   . Arthritis   . Dry skin   . Inguinal hernia, left   . GERD (gastroesophageal reflux disease)     DISCHARGE DIAGNOSIS:   Active Problems:   S/P shoulder replacement   PROCEDURE: Procedure(s): RIGHT TOTAL SHOULDER ARTHROPLASTY on 09/12/2015  CONSULTS:     HISTORY:  See H&P in chart.  HOSPITAL COURSE:  Scott Zamora is a 81 y.o. admitted on 09/12/2015 with a diagnosis of RIGHT SHOULDER OA .  They were brought to the operating room on 09/12/2015 and underwent Procedure(s): RIGHT TOTAL SHOULDER ARTHROPLASTY.    They were given perioperative antibiotics: Anti-infectives    Start     Dose/Rate Route Frequency Ordered Stop   09/12/15 1430  ceFAZolin (ANCEF) IVPB 2 g/50 mL premix     2 g 100 mL/hr over 30 Minutes Intravenous Every 6 hours 09/12/15 1318 09/13/15 0321   09/12/15 0600  ceFAZolin (ANCEF) IVPB 2 g/50 mL premix     2 g 100 mL/hr over 30 Minutes Intravenous To [facility] 09/11/15 1050 09/12/15 0750    .  Patient underwent the above named procedure and tolerated it well. The following day they were hemodynamically stable and pain was controlled on oral analgesics. They were neurovascularly intact to the operative extremity. OT was ordered and worked with patient per protocol. They were medically and orthopaedically stable for discharge on day 1.    DIAGNOSTIC STUDIES:  RECENT RADIOGRAPHIC STUDIES :  No results found.  RECENT VITAL SIGNS:  Patient Vitals for the past 24 hrs:  BP Temp Pulse Resp SpO2  09/13/15 0500 127/77 mmHg 97.9 F (36.6 C) 69 16 97 %  09/12/15 1900 (!) 157/92 mmHg 100.1 F (37.8 C) 100 16 96 %   09/12/15 1258 (!) 142/89 mmHg 97.9 F (36.6 C) 82 16 99 %  09/12/15 1245 (!) 139/93 mmHg 97.7 F (36.5 C) 82 - 94 %  09/12/15 1135 (!) 150/90 mmHg - 66 15 100 %  09/12/15 1100 (!) 152/82 mmHg - 63 19 100 %  09/12/15 1045 (!) 153/83 mmHg - 62 17 100 %  09/12/15 1030 (!) 151/85 mmHg - 62 (!) 21 100 %  09/12/15 1015 (!) 153/81 mmHg - (!) 59 (!) 21 100 %  09/12/15 1000 (!) 161/85 mmHg - (!) 56 14 100 %  09/12/15 0948 (!) 157/85 mmHg 98.2 F (36.8 C) (!) 56 (!) 21 100 %  .  RECENT EKG RESULTS:    Orders placed or performed in visit on 09/04/15  . EKG 12-Lead    DISCHARGE INSTRUCTIONS:  Discharge Instructions    Discontinue IV    Complete by:  As directed            DISCHARGE MEDICATIONS:     Medication List    TAKE these medications        acetaminophen 500 MG tablet  Commonly known as:  TYLENOL  Take 500 mg by mouth at bedtime.     aspirin EC 81 MG  tablet  Take 81 mg by mouth daily.     COD LIVER OIL PO  Take 1 tablet by mouth daily as needed (for supplementation).     COQ-10 PO  Take 1 tablet by mouth daily.     diazepam 2 MG tablet  Commonly known as:  VALIUM  Take 1 tablet (2 mg total) by mouth every 6 (six) hours as needed for muscle spasms or sedation.     flunisolide 25 MCG/ACT (0.025%) Soln  Commonly known as:  NASALIDE  Inhale 2 sprays into the lungs daily as needed. For congestion.     hydrochlorothiazide 25 MG tablet  Commonly known as:  HYDRODIURIL  Take 12.5 mg by mouth daily.     loratadine 10 MG tablet  Commonly known as:  CLARITIN  Take 10 mg by mouth daily as needed. For allergies     naproxen 500 MG tablet  Commonly known as:  NAPROSYN  Take 500 mg by mouth 2 (two) times daily as needed. For pain     oxyCODONE-acetaminophen 5-325 MG tablet  Commonly known as:  PERCOCET  Take 1-2 tablets by mouth every 4 (four) hours as needed.     PAPAYA AND ENZYMES PO  Take 2 tablets by mouth daily as needed (for digestive support).      POTASSIUM PO  Take 1 tablet by mouth daily. OTC.     sildenafil 100 MG tablet  Commonly known as:  VIAGRA  Take 50 mg by mouth daily as needed. For erectile dysfunction     tamsulosin 0.4 MG Caps capsule  Commonly known as:  FLOMAX  Take 0.4 mg by mouth daily after supper.     TURMERIC PO  Take 300 mg by mouth daily.     verapamil 240 MG CR tablet  Commonly known as:  CALAN-SR  Take 240 mg by mouth at bedtime.     vitamin B-12 250 MCG tablet  Commonly known as:  CYANOCOBALAMIN  Take 250 mcg by mouth 2 (two) times a week.     Vitamin D-3 1000 units Caps  Take 1,000 Units by mouth daily.        FOLLOW UP VISIT:       Follow-up Information    Follow up with Metta Clines SUPPLE, MD.   Specialty:  Orthopedic Surgery   Why:  call to be seen in 10-14days   Contact information:   [address] [place] [address] [id number]       DISCHARGE TO: Home  DISPOSITION: Good  DISCHARGE CONDITION:  Festus Barren for Dr. Justice Britain 09/13/2015, 8:02 AM

## 2015-09-13 NOTE — Progress Notes (Signed)
Occupational Therapy Treatment/Discharge Patient Details Name: Scott Zamora MRN: [id number] DOB: 04/30/1935 Today's Date: 09/13/2015    History of present illness 81 y.o. male s/p R total shoulder arthroplasty. PMH significant for HTN, arthritis, GERD, inguinal hernia, vertigo, sleep apnea, L knee arthroscopy, and L TSA (2014).   OT comments  Pt making good progress towards occupational therapy goals. Pt completed RUE ROM exercises and UB/LB dressing tasks with pt's wife assisting and therapist providing min verbal instructional cues. Reviewed shoulder d/c handout and pt has no further questions. Pt with no further acute OT needs. OT signing off.   Follow Up Recommendations  Outpatient OT (progress shoulder rehab at MD discretion)    Equipment Recommendations  3 in 1 bedside comode    Recommendations for Other Services      Precautions / Restrictions Precautions Precautions: Shoulder Type of Shoulder Precautions: Passive protocol Shoulder Interventions: Shoulder sling/immobilizer;Off for dressing/bathing/exercises (OK to remove when sitting recliner) Precaution Booklet Issued: No Precaution Comments: No shoulder AROM; PROM shoulder FF 90, ABD 60, ER 30; elbow/wrist/hand AROM ok,  Required Braces or Orthoses: Sling Restrictions Weight Bearing Restrictions: Yes RUE Weight Bearing: Non weight bearing       Mobility Bed Mobility Overal bed mobility: Needs Assistance Bed Mobility: Supine to Sit     Supine to sit: Supervision;HOB elevated     General bed mobility comments: HOB elevated, no use of bedrails. Supervision for safety.  Transfers Overall transfer level: Needs assistance Equipment used: None Transfers: Sit to/from Stand Sit to Stand: Min guard         General transfer comment: Min guard for safety.     Balance Overall balance assessment: Needs assistance Sitting-balance support: No upper extremity supported;Feet supported Sitting balance-Leahy Scale:  Good     Standing balance support: No upper extremity supported;During functional activity Standing balance-Leahy Scale: Good Standing balance comment: Able to maintain balance while completing dressing tasks without UE support                   ADL Overall ADL's : Needs assistance/impaired     Grooming: Wash/dry hands;Min guard;Standing   Upper Body Bathing: Minimal assitance;With caregiver independent assisting;Sitting   Lower Body Bathing: Minimal assistance;With caregiver independent assisting;Sit to/from stand   Upper Body Dressing : Moderate assistance;With caregiver independent assisting;Sitting;Cueing for compensatory techniques Upper Body Dressing Details (indicate cue type and reason): Cues for compensatory strategies Lower Body Dressing: Moderate assistance;With caregiver independent assisting;Sit to/from stand   Toilet Transfer: Min guard;With caregiver independent assisting;Ambulation;Regular Toilet   Toileting- Clothing Manipulation and Hygiene: Minimal assistance;With caregiver independent assisting;Sit to/from stand       Functional mobility during ADLs: Min guard General ADL Comments: Supervised while pt and pt's wife completed UB and LB dressing and donned/doffed sling. Completed UE exercises with pt this session.      Vision                     Perception     Praxis      Cognition   Behavior During Therapy: WFL for tasks assessed/performed Overall Cognitive Status: Within Functional Limits for tasks assessed                       Extremity/Trunk Assessment  Upper Extremity Assessment Upper Extremity Assessment: RUE deficits/detail RUE Deficits / Details: decreased ROM and strength as expected post op RUE: Unable to fully assess due to immobilization   Lower Extremity Assessment Lower Extremity Assessment:  Overall [facility] for tasks assessed   Cervical / Trunk Assessment Cervical / Trunk Assessment: Normal    Exercises  Shoulder Exercises Shoulder Flexion: PROM;Right;10 reps;Seated (0-90) Shoulder ABduction: PROM;Right;10 reps;Seated (0-60) Shoulder External Rotation: PROM;Right;10 reps;Seated (0-30) Elbow Flexion: AROM;Right;10 reps;Seated Elbow Extension: AROM;Right;10 reps;Seated Wrist Flexion: AROM;Right;10 reps;Seated Wrist Extension: AROM;Right;10 reps;Seated Digit Composite Flexion: AROM;Right;10 reps;Seated Composite Extension: AROM;Right;10 reps;Seated Neck Flexion: AROM;10 reps;Seated Neck Extension: AROM;10 reps;Seated Neck Lateral Flexion - Right: AROM;10 reps;Seated Neck Lateral Flexion - Left: AROM;10 reps;Seated Donning/doffing shirt without moving shoulder: Minimal assistance Method for sponge bathing under operated UE: Minimal assistance Donning/doffing sling/immobilizer: Minimal assistance Correct positioning of sling/immobilizer: Minimal assistance ROM for elbow, wrist and digits of operated UE: Min-guard Sling wearing schedule (on at all times/off for ADL's): Modified independent Proper positioning of operated UE when showering: Modified independent Positioning of UE while sleeping: Minimal assistance   Shoulder Instructions Shoulder Instructions Donning/doffing shirt without moving shoulder: Minimal assistance Method for sponge bathing under operated UE: Minimal assistance Donning/doffing sling/immobilizer: Minimal assistance Correct positioning of sling/immobilizer: Minimal assistance ROM for elbow, wrist and digits of operated UE: Min-guard Sling wearing schedule (on at all times/off for ADL's): Modified independent Proper positioning of operated UE when showering: Modified independent Positioning of UE while sleeping: Minimal assistance     General Comments      Pertinent Vitals/ Pain       Pain Assessment: 0-10 Pain Score: 5  Pain Location: R shoulder with movement Pain Descriptors / Indicators: Aching;Sore Pain Intervention(s): Limited activity within patient's  tolerance;Monitored during session;Repositioned;Ice applied  Home Living Family/patient expects to be discharged to:: Private residence Living Arrangements: Spouse/significant other Available Help at Discharge: Family;Available 24 hours/day Type of Home: House Home Access: Stairs to enter [employer] of Steps: 2 Entrance Stairs-Rails: Right;Left Home Layout: One level     Bathroom Shower/Tub: Tub/shower unit;Curtain;Walk-in shower;Door Shower/tub characteristics: Architectural technologist: Handicapped height Bathroom Accessibility: Yes How Accessible: Accessible via walker Home Equipment: Hand held shower head;Walker - 2 wheels;Cane - single point          Prior Functioning/Environment Level of Independence: Needs assistance  Gait / Transfers Assistance Needed: ocassionally used SPC for community mobility ADL's / Homemaking Assistance Needed: occasional assistance with dressing due to shoulder pain       Frequency Min 2X/week     Progress Toward Goals  OT Goals(current goals can now be found in the care plan section)  Progress towards OT goals: Goals met/education completed, patient discharged from OT  Acute Rehab OT Goals Patient Stated Goal: to go home today OT Goal Formulation: With patient Time For Goal Achievement: 09/27/15 Potential to Achieve Goals: Good ADL Goals Pt Will Perform Upper Body Bathing: with min assist;sitting Pt Will Perform Upper Body Dressing: with min assist;sitting Pt Will Perform Tub/Shower Transfer: Tub transfer;with supervision;ambulating;3 in 1 Pt/caregiver will Perform Home Exercise Program: Increased ROM;Right Upper extremity;With Supervision;With written HEP provided  Plan All goals met and education completed, patient discharged from OT services    Co-evaluation                 End of Session Equipment Utilized During Treatment: Other (comment) (sling)   Activity Tolerance Patient tolerated treatment well    Patient Left in chair;with call bell/phone within reach;with family/visitor present   Nurse Communication Mobility status        Time: 4193-7902 OT Time Calculation (min): 31 min  Charges: OT General Charges $OT Visit: 1 Procedure OT Evaluation $OT Eval Moderate Complexity: 1  Procedure OT Treatments $Self Care/Home Management : 23-37 mins  Redmond Baseman, OTR/L Pager: [phone] 09/13/2015, 4:57 PM

## 2015-09-13 NOTE — Anesthesia Postprocedure Evaluation (Signed)
Anesthesia Post Note  Patient: Scott Zamora  Procedure(s) Performed: Procedure(s) (LRB): RIGHT TOTAL SHOULDER ARTHROPLASTY (Right)  Patient location during evaluation: PACU Anesthesia Type: General Level of consciousness: awake and alert and patient cooperative Pain management: pain level controlled Vital Signs Assessment: post-procedure vital signs reviewed and stable Respiratory status: spontaneous breathing and respiratory function stable Cardiovascular status: stable Anesthetic complications: no    Last Vitals:  Filed Vitals:   09/12/15 1900 09/13/15 0500  BP: 157/92 127/77  Pulse: 100 69  Temp: 37.8 C 36.6 C  Resp: 16 16    Last Pain:  Filed Vitals:   09/13/15 0656  PainSc: [facility]

## 2015-09-13 NOTE — Progress Notes (Signed)
Per Ortho PA patient may be discharged once he voids his usual amount. Patient just voided 100 cc of urine with no difficulty starting flow. Patient states he is ready to be discharged. PT to work with patient prior to d/c. PT notified that patient is ready.  Will continue to monitor. Roselyn Reef Eh Sauseda,RN

## 2015-09-25 DIAGNOSIS — Z96611 Presence of right artificial shoulder joint: Secondary | ICD-10-CM | POA: Diagnosis not present

## 2015-09-25 DIAGNOSIS — Z471 Aftercare following joint replacement surgery: Secondary | ICD-10-CM | POA: Diagnosis not present

## 2015-09-26 ENCOUNTER — Encounter (HOSPITAL_COMMUNITY): Payer: Self-pay

## 2015-09-26 ENCOUNTER — Ambulatory Visit (HOSPITAL_COMMUNITY): Payer: Medicare Other | Attending: Orthopedic Surgery

## 2015-09-26 DIAGNOSIS — R29898 Other symptoms and signs involving the musculoskeletal system: Secondary | ICD-10-CM

## 2015-09-26 DIAGNOSIS — M25511 Pain in right shoulder: Secondary | ICD-10-CM

## 2015-09-26 DIAGNOSIS — M629 Disorder of muscle, unspecified: Secondary | ICD-10-CM | POA: Insufficient documentation

## 2015-09-26 DIAGNOSIS — Z96611 Presence of right artificial shoulder joint: Secondary | ICD-10-CM | POA: Diagnosis not present

## 2015-09-26 DIAGNOSIS — M6289 Other specified disorders of muscle: Secondary | ICD-10-CM

## 2015-09-26 DIAGNOSIS — M25611 Stiffness of right shoulder, not elsewhere classified: Secondary | ICD-10-CM | POA: Diagnosis not present

## 2015-09-26 NOTE — Therapy (Signed)
[place], [place], [address] Phone: [phone]   Fax:  [phone]  Occupational Therapy Evaluation  Patient Details  Name: Scott Zamora MRN: [id number] Date of Birth: [birth year] Referring Provider: Justice Britain, MD  Encounter Date: 09/26/2015      OT End of Session - 09/26/15 1201    Visit Number 1   Number of Visits 24   Date for OT Re-Evaluation 10/26/15   Authorization Type 1) Medicare 2) [facility] 3)[employer]   Authorization Time Period before 10th visit   Authorization - Visit Number 1   Authorization - Number of Visits 10   OT Start Time 1100   OT Stop Time 1150   OT Time Calculation (min) 50 min   Activity Tolerance Patient tolerated treatment well   Behavior During Therapy [facility] for tasks assessed/performed      Past Medical History  Diagnosis Date  . Hypertension   . Vertigo     hx of  . Sleep apnea     has CPAP but does not wear it  . Seasonal allergies   . Arthritis   . Dry skin   . Inguinal hernia, left   . GERD (gastroesophageal reflux disease)     Past Surgical History  Procedure Laterality Date  . Knee arthroscopy      left knee  . Colonoscopy w/ polypectomy    . Total shoulder arthroplasty  08/11/2012    Dr Justice Britain  . Total shoulder arthroplasty  08/11/2012    Procedure: TOTAL SHOULDER ARTHROPLASTY;  Surgeon: Marin Shutter, MD;  Location: [place];  Service: Orthopedics;  Laterality: Left;  . Inguinal hernia repair Left 04/18/2014    Procedure: HERNIA REPAIR INGUINAL ADULT;  Surgeon: Jamesetta So, MD;  Location[facility];  Service: General;  Laterality: Left;  . Insertion of mesh Left 04/18/2014    Procedure: INSERTION OF MESH;  Surgeon: Jamesetta So, MD;  Location: [facility];  Service: General;  Laterality: Left;  . Hernia repair    . Eye surgery Bilateral   . Total shoulder arthroplasty Right 09/12/2015    Procedure: RIGHT TOTAL SHOULDER ARTHROPLASTY;  Surgeon: Justice Britain, MD;   Location: [place];  Service: Orthopedics;  Laterality: Right;    There were no vitals filed for this visit.  Visit Diagnosis:  S/P shoulder replacement, right - Plan: Ot plan of care cert/re-cert  Shoulder pain, right - Plan: Ot plan of care cert/re-cert  Shoulder weakness - Plan: Ot plan of care cert/re-cert  Shoulder stiffness, right - Plan: Ot plan of care cert/re-cert  Tight fascia - Plan: Ot plan of care cert/re-cert      Subjective Assessment - 09/26/15 1151    Subjective  S: What exactly am I able to do with this arm?   Pertinent History Patient is a 81 y/o male S/P right rotator cuff repair which occured on 09/12/15. patient is currently in a sling which he is to wear at all times. Dr. Onnie Graham has referred patient to occupational therapy for evaluation and treatment.    Limitations Protocol: Week 0-4: Passive ROM to 90, External rotation to 30, IR to 90/abdomen, Abduction to 60, Start isometrics at day 10, pulleys in flexion. No resisted internal rotation x6 weeks. Week 3-6: Initiate AA/ROM. Progress flexion 90-100, external rotation to 30 degrees, internal rotation to 45, rhythmic stabilization exercises. Pulleys.  Week 6-8: Cont. AA/ROM. Flexion to tolerance, external and internal rotation to 90 in abduction, all  motions to tolerance. A/ROM flexion, strengthening theraband exercises, sidelying A/ROM. Week 9-12: Progress to strengthening.    Patient Stated Goals FOTO score: 7/100   Currently in Pain? Yes   Pain Score 3    Pain Location Shoulder   Pain Orientation Right   Pain Descriptors / Indicators Aching   Pain Type Surgical pain   Pain Radiating Towards N/A   Pain Onset 1 to 4 weeks ago   Pain Frequency Intermittent   Aggravating Factors  Movement. patient reports a 10/10 pain with movement.   Pain Relieving Factors Pain medication and ice.   Effect of Pain on Daily Activities Patient is unable to use his RUE for any daily tasks.    Multiple Pain Sites No            [facility] OT Assessment - 09/26/15 1107    Assessment   Diagnosis Right rotator cuff replacement   Referring Provider Justice Britain, MD   Onset Date 09/12/15   Assessment 10/23/15 - Supple   Prior Therapy Patient received OT at this facility for Left shoulder replacement   Precautions   Precautions Shoulder   Type of Shoulder Precautions See Protocol.    Shoulder Interventions Shoulder sling/immobilizer   Required Braces or Orthoses Sling   Restrictions   Weight Bearing Restrictions Yes   Balance Screen   Has the patient fallen in the past 6 months No   Home  Environment   Family/patient expects to be discharged to: Private residence   Living Arrangements Spouse/significant other   Prior Function   Level of Independence Independent;Independent with transfers   Vocation Retired   [employer] Patient was a Actor for 17 years. Patient was in the [employer] for 30 years.    Leisure patient enjoys gardening, playing golf. and bowling.    ADL   ADL comments Unable to complete any daily tasks using RUE.    Mobility   Mobility Status Independent   Written Expression   Dominant Hand Right   Vision - History   Baseline Vision Wears glasses all the time   Cognition   Overall Cognitive Status Within Functional Limits for tasks assessed   ROM / Strength   AROM / PROM / Strength PROM;AROM;Strength   Palpation   Palpation comment Max fascial restrictions in right upper arm ,trapezius, and scapularis region.    AROM   Overall AROM  Unable to assess;Due to precautions   PROM   Overall PROM Comments Assessed supine. IR/er adducted   PROM Assessment Site Shoulder   Right/Left Shoulder Right   Right Shoulder Extension 80 Degrees   Right Shoulder ABduction 40 Degrees   Right Shoulder Internal Rotation 15 Degrees   Right Shoulder External Rotation 90 Degrees   Strength   Overall Strength Unable to assess;Due to precautions                         OT  Education - 09/26/15 1147    Education provided Yes   Education Details table slides, wrist and elbow A/ROM, pendulums. Discussed positioning of RUE when seated in recliner. Using stress ball for grip strengthening.    Person(s) Educated Patient   Methods Explanation;Demonstration;Verbal cues;Handout   Comprehension Returned demonstration;Verbalized understanding;Verbal cues required          OT Short Term Goals - 09/26/15 1212    OT SHORT TERM GOAL #1   Title Patient will be educated and independent with HEP to increase functional  use of RUE.   Time 4   Period Weeks   Status New   OT SHORT TERM GOAL #2   Title Patient will increase P/ROM of RUE to [facility] to increase ability to get shirts on and off.   Time 4   Period Weeks   Status New   OT SHORT TERM GOAL #3   Title Patient will increase RUE strength to 3/5 to increase ability to complete activities below shoulder level.    Time 4   Period Weeks   Status New   OT SHORT TERM GOAL #4   Title Patient will decrease pain level to 5/10 when using RUE to increase ability to use RUE for 50% of daily tasks.    Time 4   Period Weeks   Status New   OT SHORT TERM GOAL #5   Title Patient will decrease fascial restrictions to mod amount to increase functional mobility of RUE.    Time 4   Period Weeks   Status New           OT Long Term Goals - 07-Oct-2015 1215    OT LONG TERM GOAL #1   Title Patient will return to highest level of independence using RUE for all daily and leisure tasks.    Time 8   Period Weeks   Status New   OT LONG TERM GOAL #2   Title Patient will increase RUE A/ROM to [facility] to increase ability to complete tasks above shoulder.    Time 8   Period Weeks   Status New   OT LONG TERM GOAL #3   Title Patient will increase RUE strength to 4+/5 to increase ability to return to golf and bowling activities.    Time 8   Period Weeks   Status New   OT LONG TERM GOAL #4   Title Patient will decrease pain level in RUE  during daily tasks to 3/10 or less.   Time 8   Period Weeks   Status New   OT LONG TERM GOAL #5   Title Patient will decrease fascial restrictions to min amount to increase functional mobility needed to return to leisure tasks.    Time 8   Period Weeks   Status New               Plan - 10-07-15 1208    Clinical Impression Statement A: Patient is a 81 y/o male S/P right rotator cuff replacement causing increased pain and fascial restrictions and decreased strength and ROM resulting in difficulty completing daily tasks using RUE.   Pt will benefit from skilled therapeutic intervention in order to improve on the following deficits (Retired) Decreased strength;Pain;Impaired UE functional use;Increased fascial restricitons;Decreased range of motion   Rehab Potential Excellent   Clinical Impairments Affecting Rehab Potential PMH significant for HTN, arthritis, GERD, inguinal hernia, vertigo, sleep apnea, L knee arthroscopy, and L TSA (2014)   OT Frequency 3x / week   OT Duration 8 weeks   OT Treatment/Interventions Self-care/ADL training;Ultrasound;DME and/or AE instruction;Scar mobilization;Iontophoresis;Passive range of motion;Patient/family education;Cryotherapy;Electrical Stimulation;Moist Heat;Therapeutic activities;Therapeutic exercises;Manual Therapy   Plan P: Pt will benefit from skilled OT services to increase functional using of RUE during daily and leisure tasks. Treatment Plan: Follow protocol sent by Dr. Onnie Graham. Myofascial release, manual stretching, muscle energy technique, P/ROM, AA/ROM, A/ROM, general strengthening. Modalities PRN.   Consulted and Agree with Plan of Care Patient          G-Codes - 10-07-2015 1224  Functional Assessment Tool Used FOTO score: 7/100 (93% imapired)   Functional Limitation Carrying, moving and handling objects   Carrying, Moving and Handling Objects Current Status [id number]) At least 80 percent but less than 100 percent impaired, limited or  restricted   Carrying, Moving and Handling Objects Goal Status [id number]) At least 20 percent but less than 40 percent impaired, limited or restricted      Problem List Patient Active Problem List   Diagnosis Date Noted  . S/P shoulder replacement 08/29/2012  . Pain in joint, shoulder region 08/29/2012  . Muscle weakness (generalized) 08/29/2012  . Arthritis of shoulder 08/12/2012    Ailene Ravel, OTR/L,CBIS  [phone]  09/26/2015, 12:28 PM  [facility] [address] [place], [place], [address] Phone: [phone]   Fax:  [phone]  Name: Scott Zamora MRN: [id number] Date of Birth: [birth year]

## 2015-09-26 NOTE — Addendum Note (Signed)
Addended by: Ailene Ravel D on: 09/26/2015 12:37 PM   Modules accepted: Medications

## 2015-09-26 NOTE — Patient Instructions (Signed)
TOWEL SLIDES COMPLETE FOR 1-3 MINUTES, 3-5 TIMES PER DAY  SHOULDER: Flexion On Table   Place hands on table, elbows straight. Move hips away from body. Press hands down into table. Hold ___ seconds. ___ reps per set, ___ sets per day, ___ days per week    Internal Rotation (Assistive)   Seated with elbow bent at right angle and held against side, slide arm on table surface in an inward arc. Repeat ____ times. Do ____ sessions per day. Activity: Use this motion to brush crumbs off the table.  Copyright  VHI. All rights reserved.    COMPLETE PENDULUM EXERCISES FOR 30 SECONDS TO A MINUTE EACH, 3-5 TIMES PER DAY. ROM: Pendulum (Side-to-Side)     http://orth.exer.us/792   Copyright  VHI. All rights reserved.  Pendulum Forward/Back   Bend forward 90 at waist, using table for support. Rock body forward and back to swing arm. Repeat ____ times. Do ____ sessions per day.  Copyright  VHI. All rights reserved.  Pendulum Circular   Bend forward 90 at waist, leaning on table for support. Rock body in a circular pattern to move arm clockwise ____ times then counterclockwise ____ times. Do ____ sessions per day.  Copyright  VHI. All rights reserved.  AROM: Wrist Extension   With right palm down, bend wrist up. Repeat 10____ times per set. Do ____ sets per session. Do __3__ sessions per day.  Copyright  VHI. All rights reserved.   AROM: Wrist Flexion   With right palm up, bend wrist up. Repeat ___10_ times per set. Do ____ sets per session. Do __3__ sessions per day.  Copyright  VHI. All rights reserved.   AROM: Forearm Pronation / Supination   With right arm in handshake position, slowly rotate palm down until stretch is felt. Relax. Then rotate palm up until stretch is felt. Repeat __10__ times per set. Do ____ sets per session. Do __3__ sessions per day.  ELBOW FLEXION EXTENSION  Bend your elbow upwards as shown and then lower to a straighten position.  Complete 10 times. 3 sessions per day.

## 2015-10-01 ENCOUNTER — Encounter (HOSPITAL_COMMUNITY): Payer: Self-pay | Admitting: Occupational Therapy

## 2015-10-01 ENCOUNTER — Ambulatory Visit (HOSPITAL_COMMUNITY): Payer: Medicare Other | Admitting: Occupational Therapy

## 2015-10-01 DIAGNOSIS — R29898 Other symptoms and signs involving the musculoskeletal system: Secondary | ICD-10-CM

## 2015-10-01 DIAGNOSIS — M6289 Other specified disorders of muscle: Secondary | ICD-10-CM

## 2015-10-01 DIAGNOSIS — M25611 Stiffness of right shoulder, not elsewhere classified: Secondary | ICD-10-CM | POA: Diagnosis not present

## 2015-10-01 DIAGNOSIS — M629 Disorder of muscle, unspecified: Secondary | ICD-10-CM

## 2015-10-01 DIAGNOSIS — M25511 Pain in right shoulder: Secondary | ICD-10-CM | POA: Diagnosis not present

## 2015-10-01 DIAGNOSIS — Z96611 Presence of right artificial shoulder joint: Secondary | ICD-10-CM | POA: Diagnosis not present

## 2015-10-01 NOTE — Therapy (Signed)
[place], [place], [address] Phone: [phone]   Fax:  [phone]  Occupational Therapy Treatment  Patient Details  Name: BRIYAN CRIDDLE MRN: [id number] Date of Birth: [birth year] Referring Provider: Justice Britain, MD  Encounter Date: 10/01/2015      OT End of Session - 10/01/15 1433    Visit Number 2   Number of Visits 24   Date for OT Re-Evaluation 10/26/15   Authorization Type 1) Medicare 2) [facility] 3)[employer]   Authorization Time Period before 10th visit   Authorization - Visit Number 2   Authorization - Number of Visits 10   OT Start Time 1345   OT Stop Time 1427   OT Time Calculation (min) 42 min   Activity Tolerance Patient tolerated treatment well   Behavior During Therapy [facility] for tasks assessed/performed      Past Medical History  Diagnosis Date  . Hypertension   . Vertigo     hx of  . Sleep apnea     has CPAP but does not wear it  . Seasonal allergies   . Arthritis   . Dry skin   . Inguinal hernia, left   . GERD (gastroesophageal reflux disease)     Past Surgical History  Procedure Laterality Date  . Knee arthroscopy      left knee  . Colonoscopy w/ polypectomy    . Total shoulder arthroplasty  08/11/2012    Dr Justice Britain  . Total shoulder arthroplasty  08/11/2012    Procedure: TOTAL SHOULDER ARTHROPLASTY;  Surgeon: Marin Shutter, MD;  Location: [place];  Service: Orthopedics;  Laterality: Left;  . Inguinal hernia repair Left 04/18/2014    Procedure: HERNIA REPAIR INGUINAL ADULT;  Surgeon: Jamesetta So, MD;  Location[facility];  Service: General;  Laterality: Left;  . Insertion of mesh Left 04/18/2014    Procedure: INSERTION OF MESH;  Surgeon: Jamesetta So, MD;  Location: [facility];  Service: General;  Laterality: Left;  . Hernia repair    . Eye surgery Bilateral   . Total shoulder arthroplasty Right 09/12/2015    Procedure: RIGHT TOTAL SHOULDER ARTHROPLASTY;  Surgeon: Justice Britain, MD;   Location: [place];  Service: Orthopedics;  Laterality: Right;    There were no vitals filed for this visit.  Visit Diagnosis:  Shoulder pain, right  Shoulder weakness  Shoulder stiffness, right  Tight fascia      Subjective Assessment - 10/01/15 1344    Subjective  S: I tried to do those circle exercises but my wife fussed at me.    Currently in Pain? Yes   Pain Score 3    Pain Location Shoulder   Pain Orientation Right   Pain Descriptors / Indicators Aching   Pain Type Surgical pain   Pain Radiating Towards n/a   Pain Onset 1 to 4 weeks ago   Pain Frequency Intermittent   Aggravating Factors  movement, use   Pain Relieving Factors pain medications, ice   Effect of Pain on Daily Activities pt is unable to use RUE for daily tasks   Multiple Pain Sites No            [facility] OT Assessment - 10/01/15 1344    Assessment   Diagnosis Right total shoulder arthroplasty   Precautions   Precautions Shoulder   Type of Shoulder Precautions See Protocol.    Shoulder Interventions Shoulder sling/immobilizer   Required Braces or Orthoses Sling  OT Treatments/Exercises (OP) - 10/01/15 1349    Exercises   Exercises Shoulder   Shoulder Exercises: Supine   Protraction PROM;10 reps   Horizontal ABduction PROM;10 reps   External Rotation PROM;10 reps   Internal Rotation PROM;10 reps   Flexion PROM;10 reps   ABduction PROM;10 reps   Shoulder Exercises: Seated   Elevation AROM;10 reps   Extension AROM;10 reps   Row AROM;10 reps   Shoulder Exercises: Therapy Ball   Flexion 10 reps   ABduction 10 reps   Shoulder Exercises: Isometric Strengthening   Flexion Supine;3X3"   Extension Supine;3X3"   External Rotation Supine;3X3"   Internal Rotation Limitations no IR per protocol   ABduction Supine;3X3"   ADduction Supine;3X3"   Manual Therapy   Manual Therapy Myofascial release   Manual therapy comments manual therapy completed prior to therapeutic  exercise this date   Myofascial Release Myofascial release to right upper arm, trapezius, and scapularis regions to decrease pain and fascial restrictions and increase joint mobility                  OT Short Term Goals - 10/01/15 1518    OT SHORT TERM GOAL #1   Title Patient will be educated and independent with HEP to increase functional use of RUE.   Time 4   Period Weeks   Status On-going   OT SHORT TERM GOAL #2   Title Patient will increase P/ROM of RUE to [facility] to increase ability to get shirts on and off.   Time 4   Period Weeks   Status On-going   OT SHORT TERM GOAL #3   Title Patient will increase RUE strength to 3/5 to increase ability to complete activities below shoulder level.    Time 4   Period Weeks   Status On-going   OT SHORT TERM GOAL #4   Title Patient will decrease pain level to 5/10 when using RUE to increase ability to use RUE for 50% of daily tasks.    Time 4   Period Weeks   Status On-going   OT SHORT TERM GOAL #5   Title Patient will decrease fascial restrictions to mod amount to increase functional mobility of RUE.    Time 4   Period Weeks   Status On-going           OT Long Term Goals - 10/01/15 1518    OT LONG TERM GOAL #1   Title Patient will return to highest level of independence using RUE for all daily and leisure tasks.    Time 8   Period Weeks   Status On-going   OT LONG TERM GOAL #2   Title Patient will increase RUE A/ROM to [facility] to increase ability to complete tasks above shoulder.    Time 8   Period Weeks   Status On-going   OT LONG TERM GOAL #3   Title Patient will increase RUE strength to 4+/5 to increase ability to return to golf and bowling activities.    Time 8   Period Weeks   Status On-going   OT LONG TERM GOAL #4   Title Patient will decrease pain level in RUE during daily tasks to 3/10 or less.   Time 8   Period Weeks   Status On-going   OT LONG TERM GOAL #5   Title Patient will decrease fascial  restrictions to min amount to increase functional mobility needed to return to leisure tasks.    Time 8  Period Weeks   Status On-going               Plan - 10/01/15 1433    Clinical Impression Statement A: Initiated myofascial release, P/ROM, scapular AROM, isometrics, and therapy ball exercises this session, per protocol. Pt with min difficulty relaxing arm during manual stretching, verbal cuing to correct. Pt reports he is completing his exercises at home and they are going well.    Plan P: Continue to work on decreasing fascial restrictions and increasing P/ROM within protocol.         Problem List Patient Active Problem List   Diagnosis Date Noted  . S/P shoulder replacement 08/29/2012  . Pain in joint, shoulder region 08/29/2012  . Muscle weakness (generalized) 08/29/2012  . Arthritis of shoulder 08/12/2012    Guadelupe Sabin, OTR/L  [phone]  10/01/2015, 3:19 PM  [place], [place], [address] Phone: [phone]   Fax:  [phone]  Name: DELRICO SHENKMAN MRN: [id number] Date of Birth: [birth year]

## 2015-10-02 ENCOUNTER — Encounter (HOSPITAL_COMMUNITY): Payer: Self-pay

## 2015-10-02 ENCOUNTER — Ambulatory Visit (HOSPITAL_COMMUNITY): Payer: Medicare Other

## 2015-10-02 DIAGNOSIS — M25511 Pain in right shoulder: Secondary | ICD-10-CM | POA: Diagnosis not present

## 2015-10-02 DIAGNOSIS — R29898 Other symptoms and signs involving the musculoskeletal system: Secondary | ICD-10-CM

## 2015-10-02 DIAGNOSIS — M629 Disorder of muscle, unspecified: Secondary | ICD-10-CM | POA: Diagnosis not present

## 2015-10-02 DIAGNOSIS — M25611 Stiffness of right shoulder, not elsewhere classified: Secondary | ICD-10-CM

## 2015-10-02 DIAGNOSIS — Z96611 Presence of right artificial shoulder joint: Secondary | ICD-10-CM | POA: Diagnosis not present

## 2015-10-02 DIAGNOSIS — M6289 Other specified disorders of muscle: Secondary | ICD-10-CM

## 2015-10-02 NOTE — Therapy (Signed)
[place], [place], [address] Phone: [phone]   Fax:  [phone]  Occupational Therapy Treatment  Patient Details  Name: Scott Zamora MRN: [id number] Date of Birth: 10/07/1934 Referring Provider: Justice Britain, MD  Encounter Date: 10/02/2015      OT End of Session - 10/02/15 1424    Visit Number 3   Number of Visits 24   Date for OT Re-Evaluation 10/26/15   Authorization Type 1) Medicare 2) [facility] 3)[employer]   Authorization Time Period before 10th visit   Authorization - Visit Number 3   Authorization - Number of Visits 10   OT Start Time 1015   OT Stop Time 1100   OT Time Calculation (min) 45 min   Activity Tolerance Patient tolerated treatment well   Behavior During Therapy [facility] for tasks assessed/performed      Past Medical History  Diagnosis Date  . Hypertension   . Vertigo     hx of  . Sleep apnea     has CPAP but does not wear it  . Seasonal allergies   . Arthritis   . Dry skin   . Inguinal hernia, left   . GERD (gastroesophageal reflux disease)     Past Surgical History  Procedure Laterality Date  . Knee arthroscopy      left knee  . Colonoscopy w/ polypectomy    . Total shoulder arthroplasty  08/11/2012    Dr Justice Britain  . Total shoulder arthroplasty  08/11/2012    Procedure: TOTAL SHOULDER ARTHROPLASTY;  Surgeon: Marin Shutter, MD;  Location: [place];  Service: Orthopedics;  Laterality: Left;  . Inguinal hernia repair Left 04/18/2014    Procedure: HERNIA REPAIR INGUINAL ADULT;  Surgeon: Jamesetta So, MD;  Location: [facility];  Service: General;  Laterality: Left;  . Insertion of mesh Left 04/18/2014    Procedure: INSERTION OF MESH;  Surgeon: Jamesetta So, MD;  Location: [facility];  Service: General;  Laterality: Left;  . Hernia repair    . Eye surgery Bilateral   . Total shoulder arthroplasty Right 09/12/2015    Procedure: RIGHT TOTAL SHOULDER ARTHROPLASTY;  Surgeon: Justice Britain, MD;   Location: [place];  Service: Orthopedics;  Laterality: Right;    There were no vitals filed for this visit.  Visit Diagnosis:  Shoulder pain, right  Shoulder weakness  Shoulder stiffness, right  Tight fascia      Subjective Assessment - 10/02/15 1018    Subjective  S: I took some Naproxen for pain before coming.   Limitations Protocol: Week 0-4: Passive ROM to 90, External rotation to 30, IR to 90/abdomen, Abduction to 60, Start isometrics at day 10, pulleys in flexion. No resisted internal rotation x6 weeks. Week 3-6: Initiate AA/ROM. Progress flexion 90-100, external rotation to 30 degrees, internal rotation to 45, rhythmic stabilization exercises. Pulleys.  Week 6-8: Cont. AA/ROM. Flexion to tolerance, external and internal rotation to 90 in abduction, all motions to tolerance. A/ROM flexion, strengthening theraband exercises, sidelying A/ROM. Week 9-12: Progress to strengthening.    Currently in Pain? Yes   Pain Score 2    Pain Location Shoulder   Pain Orientation Right   Pain Descriptors / Indicators Aching   Pain Type Surgical pain   Pain Onset 1 to 4 weeks ago   Pain Frequency Intermittent   Aggravating Factors  Moving it   Multiple Pain Sites No  [facility] OT Assessment - 10/02/15 1017    Assessment   Diagnosis Right total shoulder arthroplasty   Precautions   Precautions Shoulder   Type of Shoulder Precautions See Protocol.    Shoulder Interventions Shoulder sling/immobilizer   Required Braces or Orthoses Sling                  OT Treatments/Exercises (OP) - 10/02/15 1017    Exercises   Exercises Shoulder   Shoulder Exercises: Supine   Protraction PROM;10 reps   Horizontal ABduction PROM;10 reps   External Rotation PROM;AAROM;10 reps   Internal Rotation PROM;AAROM;10 reps   Flexion PROM;10 reps   ABduction PROM;10 reps   Shoulder Exercises: Seated   Elevation AROM;10 reps   Extension AROM;10 reps   Row AROM;10 reps   Shoulder  Exercises: Pulleys   Flexion 1 minute   Shoulder Exercises: Therapy Ball   Flexion 10 reps   ABduction 10 reps   Shoulder Exercises: ROM/Strengthening   Thumb Tacks 1'   Prot/Ret//Elev/Dep 1'   Shoulder Exercises: Isometric Strengthening   Flexion Supine;3X3"   Extension Supine;3X3"   External Rotation Supine;3X3"   Internal Rotation Limitations no IR per protocol   ABduction Supine;3X3"   ADduction Supine;3X3"   Manual Therapy   Manual Therapy Myofascial release   Manual therapy comments manual therapy completed prior to therapeutic exercise this date   Myofascial Release Myofascial release to right upper arm, trapezius, and scapularis regions to decrease pain and fascial restrictions and increase joint mobility                  OT Short Term Goals - 10/01/15 1518    OT SHORT TERM GOAL #1   Title Patient will be educated and independent with HEP to increase functional use of RUE.   Time 4   Period Weeks   Status On-going   OT SHORT TERM GOAL #2   Title Patient will increase P/ROM of RUE to [facility] to increase ability to get shirts on and off.   Time 4   Period Weeks   Status On-going   OT SHORT TERM GOAL #3   Title Patient will increase RUE strength to 3/5 to increase ability to complete activities below shoulder level.    Time 4   Period Weeks   Status On-going   OT SHORT TERM GOAL #4   Title Patient will decrease pain level to 5/10 when using RUE to increase ability to use RUE for 50% of daily tasks.    Time 4   Period Weeks   Status On-going   OT SHORT TERM GOAL #5   Title Patient will decrease fascial restrictions to mod amount to increase functional mobility of RUE.    Time 4   Period Weeks   Status On-going           OT Long Term Goals - 10/01/15 1518    OT LONG TERM GOAL #1   Title Patient will return to highest level of independence using RUE for all daily and leisure tasks.    Time 8   Period Weeks   Status On-going   OT LONG TERM GOAL #2    Title Patient will increase RUE A/ROM to [facility] to increase ability to complete tasks above shoulder.    Time 8   Period Weeks   Status On-going   OT LONG TERM GOAL #3   Title Patient will increase RUE strength to 4+/5 to increase ability to return to golf  and bowling activities.    Time 8   Period Weeks   Status On-going   OT LONG TERM GOAL #4   Title Patient will decrease pain level in RUE during daily tasks to 3/10 or less.   Time 8   Period Weeks   Status On-going   OT LONG TERM GOAL #5   Title Patient will decrease fascial restrictions to min amount to increase functional mobility needed to return to leisure tasks.    Time 8   Period Weeks   Status On-going               Plan - 10/02/15 1425    Clinical Impression Statement A: Patient progressed to week 3-6 of protocol. Added AA/ROM internal and external rotation in supine. Added shoulder pulley in flexion as well as shoulder ret/prot/dep/elev and thumbtacks. Therapist provided verbal cues for form and technique.   Plan P: Continue to work on decreasing fascial restrictions and increasing P/ROM within protocol for weeks 3-6.        Problem List Patient Active Problem List   Diagnosis Date Noted  . S/P shoulder replacement 08/29/2012  . Pain in joint, shoulder region 08/29/2012  . Muscle weakness (generalized) 08/29/2012  . Arthritis of shoulder 08/12/2012    Scott Zamora, Scott Zamora,Scott Zamora  [phone]  10/02/2015, 2:30 PM  [place], [place], [address] Phone: [phone]   Fax:  [phone]  Name: Scott Zamora MRN: [id number] Date of Birth: 07-03-1935

## 2015-10-04 ENCOUNTER — Ambulatory Visit (HOSPITAL_COMMUNITY): Payer: Medicare Other | Admitting: Occupational Therapy

## 2015-10-04 ENCOUNTER — Encounter (HOSPITAL_COMMUNITY): Payer: Self-pay | Admitting: Occupational Therapy

## 2015-10-04 DIAGNOSIS — M629 Disorder of muscle, unspecified: Secondary | ICD-10-CM | POA: Diagnosis not present

## 2015-10-04 DIAGNOSIS — M25611 Stiffness of right shoulder, not elsewhere classified: Secondary | ICD-10-CM

## 2015-10-04 DIAGNOSIS — M25511 Pain in right shoulder: Secondary | ICD-10-CM | POA: Diagnosis not present

## 2015-10-04 DIAGNOSIS — R29898 Other symptoms and signs involving the musculoskeletal system: Secondary | ICD-10-CM

## 2015-10-04 DIAGNOSIS — Z96611 Presence of right artificial shoulder joint: Secondary | ICD-10-CM | POA: Diagnosis not present

## 2015-10-04 DIAGNOSIS — M6289 Other specified disorders of muscle: Secondary | ICD-10-CM

## 2015-10-04 NOTE — Therapy (Signed)
[place], [place], [address] Phone: [phone]   Fax:  [phone]  Occupational Therapy Treatment  Patient Details  Name: Scott Zamora MRN: [id number] Date of Birth: [birth year] Referring Provider: Justice Britain, MD  Encounter Date: 10/04/2015      OT End of Session - 10/04/15 1117    Visit Number 4   Number of Visits 24   Date for OT Re-Evaluation 10/26/15   Authorization Type 1) Medicare 2) [facility] 3)[employer]   Authorization Time Period before 10th visit   Authorization - Visit Number 4   Authorization - Number of Visits 10   OT Start Time 1018   OT Stop Time 1101   OT Time Calculation (min) 43 min   Activity Tolerance Patient tolerated treatment well   Behavior During Therapy [facility] for tasks assessed/performed      Past Medical History  Diagnosis Date  . Hypertension   . Vertigo     hx of  . Sleep apnea     has CPAP but does not wear it  . Seasonal allergies   . Arthritis   . Dry skin   . Inguinal hernia, left   . GERD (gastroesophageal reflux disease)     Past Surgical History  Procedure Laterality Date  . Knee arthroscopy      left knee  . Colonoscopy w/ polypectomy    . Total shoulder arthroplasty  08/11/2012    Dr Justice Britain  . Total shoulder arthroplasty  08/11/2012    Procedure: TOTAL SHOULDER ARTHROPLASTY;  Surgeon: Marin Shutter, MD;  Location: [place];  Service: Orthopedics;  Laterality: Left;  . Inguinal hernia repair Left 04/18/2014    Procedure: HERNIA REPAIR INGUINAL ADULT;  Surgeon: Jamesetta So, MD;  Location: [facility];  Service: General;  Laterality: Left;  . Insertion of mesh Left 04/18/2014    Procedure: INSERTION OF MESH;  Surgeon: Jamesetta So, MD;  Location: [facility];  Service: General;  Laterality: Left;  . Hernia repair    . Eye surgery Bilateral   . Total shoulder arthroplasty Right 09/12/2015    Procedure: RIGHT TOTAL SHOULDER ARTHROPLASTY;  Surgeon: Justice Britain, MD;   Location: [place];  Service: Orthopedics;  Laterality: Right;    There were no vitals filed for this visit.  Visit Diagnosis:  Shoulder pain, right  Shoulder weakness  Shoulder stiffness, right  Tight fascia      Subjective Assessment - 10/04/15 1020    Currently in Pain? Yes   Pain Score 2    Pain Location Shoulder   Pain Orientation Right   Pain Descriptors / Indicators Aching   Pain Type Surgical pain   Pain Radiating Towards n/a   Pain Onset 1 to 4 weeks ago   Pain Frequency Intermittent   Aggravating Factors  movement   Pain Relieving Factors pain meds, ice   Effect of Pain on Daily Activities unable to use RUE for daily tasks   Multiple Pain Sites No            [facility] OT Assessment - 10/04/15 1017    Assessment   Diagnosis Right total shoulder arthroplasty   Precautions   Precautions Shoulder   Type of Shoulder Precautions See Protocol.    Shoulder Interventions Shoulder sling/immobilizer   Required Braces or Orthoses Sling                  OT Treatments/Exercises (OP) - 10/04/15 1021  Exercises   Exercises Shoulder   Shoulder Exercises: Supine   Protraction PROM;10 reps   Horizontal ABduction PROM;10 reps   External Rotation PROM;10 reps   Internal Rotation PROM;10 reps   Flexion PROM;10 reps   ABduction PROM;10 reps   Shoulder Exercises: Seated   Elevation AROM;10 reps   Extension AROM;10 reps   Row AROM;10 reps   Shoulder Exercises: Pulleys   Flexion 1 minute   Shoulder Exercises: Therapy Ball   Flexion 10 reps   ABduction 10 reps   Shoulder Exercises: ROM/Strengthening   Thumb Tacks 1'   Prot/Ret//Elev/Dep 1'   Shoulder Exercises: Isometric Strengthening   Flexion Supine;3X5"   Extension Supine;3X5"   External Rotation Supine;3X5"   Internal Rotation Limitations no IR per protocol   ABduction Supine;3X5"   ADduction Supine;3X5"   Manual Therapy   Manual Therapy Myofascial release   Manual therapy comments manual therapy  completed prior to therapeutic exercise this date   Myofascial Release Myofascial release to right upper arm, trapezius, and scapularis regions to decrease pain and fascial restrictions and increase joint mobility                  OT Short Term Goals - 10/01/15 1518    OT SHORT TERM GOAL #1   Title Patient will be educated and independent with HEP to increase functional use of RUE.   Time 4   Period Weeks   Status On-going   OT SHORT TERM GOAL #2   Title Patient will increase P/ROM of RUE to [facility] to increase ability to get shirts on and off.   Time 4   Period Weeks   Status On-going   OT SHORT TERM GOAL #3   Title Patient will increase RUE strength to 3/5 to increase ability to complete activities below shoulder level.    Time 4   Period Weeks   Status On-going   OT SHORT TERM GOAL #4   Title Patient will decrease pain level to 5/10 when using RUE to increase ability to use RUE for 50% of daily tasks.    Time 4   Period Weeks   Status On-going   OT SHORT TERM GOAL #5   Title Patient will decrease fascial restrictions to mod amount to increase functional mobility of RUE.    Time 4   Period Weeks   Status On-going           OT Long Term Goals - 10/01/15 1518    OT LONG TERM GOAL #1   Title Patient will return to highest level of independence using RUE for all daily and leisure tasks.    Time 8   Period Weeks   Status On-going   OT LONG TERM GOAL #2   Title Patient will increase RUE A/ROM to [facility] to increase ability to complete tasks above shoulder.    Time 8   Period Weeks   Status On-going   OT LONG TERM GOAL #3   Title Patient will increase RUE strength to 4+/5 to increase ability to return to golf and bowling activities.    Time 8   Period Weeks   Status On-going   OT LONG TERM GOAL #4   Title Patient will decrease pain level in RUE during daily tasks to 3/10 or less.   Time 8   Period Weeks   Status On-going   OT LONG TERM GOAL #5   Title Patient  will decrease fascial restrictions to min amount to increase  functional mobility needed to return to leisure tasks.    Time 8   Period Weeks   Status On-going               Plan - 10/04/15 1118    Clinical Impression Statement A: Continued to follow protocol, complete P/ROM within limits and isometric exercises. Pt required verbal cuing for form during pulley exercises and prot/ret/elev/dep. Pt reports fatigue and slight discomfort at end of session.    Plan P: Continue to follow protocol for weeks 3-6. Resume AA/ROM ER/IR in supine.         Problem List Patient Active Problem List   Diagnosis Date Noted  . S/P shoulder replacement 08/29/2012  . Pain in joint, shoulder region 08/29/2012  . Muscle weakness (generalized) 08/29/2012  . Arthritis of shoulder 08/12/2012    Guadelupe Sabin, OTR/L  [phone]  10/04/2015, 11:22 AM  [place], [place], [address] Phone: [phone]   Fax:  [phone]  Name: Scott Zamora MRN: [id number] Date of Birth: Nov 25, 1934

## 2015-10-08 ENCOUNTER — Ambulatory Visit (HOSPITAL_COMMUNITY): Payer: Medicare Other

## 2015-10-08 ENCOUNTER — Encounter (HOSPITAL_COMMUNITY): Payer: Self-pay

## 2015-10-08 DIAGNOSIS — M25611 Stiffness of right shoulder, not elsewhere classified: Secondary | ICD-10-CM | POA: Diagnosis not present

## 2015-10-08 DIAGNOSIS — Z96611 Presence of right artificial shoulder joint: Secondary | ICD-10-CM | POA: Diagnosis not present

## 2015-10-08 DIAGNOSIS — M629 Disorder of muscle, unspecified: Secondary | ICD-10-CM

## 2015-10-08 DIAGNOSIS — M6289 Other specified disorders of muscle: Secondary | ICD-10-CM

## 2015-10-08 DIAGNOSIS — R29898 Other symptoms and signs involving the musculoskeletal system: Secondary | ICD-10-CM | POA: Diagnosis not present

## 2015-10-08 DIAGNOSIS — M25511 Pain in right shoulder: Secondary | ICD-10-CM

## 2015-10-08 NOTE — Therapy (Signed)
[place], [place], [address] Phone: [phone]   Fax:  [phone]  Occupational Therapy Treatment  Patient Details  Name: Scott Zamora MRN: [id number] Date of Birth: [birth year] Referring Provider: Justice Britain, MD  Encounter Date: 10/08/2015      OT End of Session - 10/08/15 1434    Visit Number 5   Number of Visits 24   Date for OT Re-Evaluation 10/26/15   Authorization Type 1) Medicare 2) [facility] 3)[employer]   Authorization Time Period before 10th visit   Authorization - Visit Number 5   Authorization - Number of Visits 10   OT Start Time 1350   OT Stop Time 1430   OT Time Calculation (min) 40 min   Activity Tolerance Patient tolerated treatment well   Behavior During Therapy [facility] for tasks assessed/performed      Past Medical History  Diagnosis Date  . Hypertension   . Vertigo     hx of  . Sleep apnea     has CPAP but does not wear it  . Seasonal allergies   . Arthritis   . Dry skin   . Inguinal hernia, left   . GERD (gastroesophageal reflux disease)     Past Surgical History  Procedure Laterality Date  . Knee arthroscopy      left knee  . Colonoscopy w/ polypectomy    . Total shoulder arthroplasty  08/11/2012    Dr Justice Britain  . Total shoulder arthroplasty  08/11/2012    Procedure: TOTAL SHOULDER ARTHROPLASTY;  Surgeon: Marin Shutter, MD;  Location: [place];  Service: Orthopedics;  Laterality: Left;  . Inguinal hernia repair Left 04/18/2014    Procedure: HERNIA REPAIR INGUINAL ADULT;  Surgeon: Jamesetta So, MD;  Location[facility];  Service: General;  Laterality: Left;  . Insertion of mesh Left 04/18/2014    Procedure: INSERTION OF MESH;  Surgeon: Jamesetta So, MD;  Location: [facility];  Service: General;  Laterality: Left;  . Hernia repair    . Eye surgery Bilateral   . Total shoulder arthroplasty Right 09/12/2015    Procedure: RIGHT TOTAL SHOULDER ARTHROPLASTY;  Surgeon: Justice Britain, MD;   Location: [place];  Service: Orthopedics;  Laterality: Right;    There were no vitals filed for this visit.  Visit Diagnosis:  Shoulder pain, right  Shoulder weakness  Shoulder stiffness, right  Tight fascia      Subjective Assessment - 10/08/15 1432    Subjective  S: I don't have any pain today, I don't know why.            [facility] OT Assessment - 10/08/15 1353    Assessment   Diagnosis Right total shoulder arthroplasty   Precautions   Precautions Shoulder   Type of Shoulder Precautions See Protocol.    Shoulder Interventions Shoulder sling/immobilizer   Required Braces or Orthoses Sling                  OT Treatments/Exercises (OP) - 10/08/15 1353    Exercises   Exercises Shoulder   Shoulder Exercises: Supine   Protraction PROM;10 reps   Horizontal ABduction PROM;10 reps   External Rotation PROM;AAROM;10 reps   Internal Rotation PROM;AAROM;10 reps   Flexion PROM;10 reps   ABduction PROM;10 reps   Shoulder Exercises: Pulleys   Flexion 1 minute   Shoulder Exercises: ROM/Strengthening   Thumb Tacks 1'   Prot/Ret//Elev/Dep 1'   Shoulder Exercises: Isometric Strengthening  Flexion Supine;3X5"   Extension Supine;3X5"   External Rotation Supine;3X5"   Internal Rotation Limitations no IR per protocol   ABduction Supine;3X5"   ADduction Supine;3X5"   Manual Therapy   Manual Therapy Myofascial release   Manual therapy comments manual therapy completed prior to therapeutic exercise this date   Myofascial Release Myofascial release to right upper arm, trapezius, and scapularis regions to decrease pain and fascial restrictions and increase joint mobility                OT Education - 10/08/15 1433    Education provided Yes   Education Details Educated patient on weaning off sling. Discussed wearing for comfort and sleep.          OT Short Term Goals - 10/01/15 1518    OT SHORT TERM GOAL #1   Title Patient will be educated and independent  with HEP to increase functional use of RUE.   Time 4   Period Weeks   Status On-going   OT SHORT TERM GOAL #2   Title Patient will increase P/ROM of RUE to [facility] to increase ability to get shirts on and off.   Time 4   Period Weeks   Status On-going   OT SHORT TERM GOAL #3   Title Patient will increase RUE strength to 3/5 to increase ability to complete activities below shoulder level.    Time 4   Period Weeks   Status On-going   OT SHORT TERM GOAL #4   Title Patient will decrease pain level to 5/10 when using RUE to increase ability to use RUE for 50% of daily tasks.    Time 4   Period Weeks   Status On-going   OT SHORT TERM GOAL #5   Title Patient will decrease fascial restrictions to mod amount to increase functional mobility of RUE.    Time 4   Period Weeks   Status On-going           OT Long Term Goals - 10/01/15 1518    OT LONG TERM GOAL #1   Title Patient will return to highest level of independence using RUE for all daily and leisure tasks.    Time 8   Period Weeks   Status On-going   OT LONG TERM GOAL #2   Title Patient will increase RUE A/ROM to [facility] to increase ability to complete tasks above shoulder.    Time 8   Period Weeks   Status On-going   OT LONG TERM GOAL #3   Title Patient will increase RUE strength to 4+/5 to increase ability to return to golf and bowling activities.    Time 8   Period Weeks   Status On-going   OT LONG TERM GOAL #4   Title Patient will decrease pain level in RUE during daily tasks to 3/10 or less.   Time 8   Period Weeks   Status On-going   OT LONG TERM GOAL #5   Title Patient will decrease fascial restrictions to min amount to increase functional mobility needed to return to leisure tasks.    Time 8   Period Weeks   Status On-going               Plan - 10/08/15 1436    Clinical Impression Statement A: Continued to follow protocol, complete AA/ROM, P/ROM within limits, isometric exercises, and pulleys in flexion.  Therapist notes muscle spasms toward end of allowed range.   Plan P: Continue to follow  protocol for weeks 3-6. Resume therapy ball exercises. Introduce rhythmic stabilization exercises standing with shoulder at 0 degrees.        Problem List Patient Active Problem List   Diagnosis Date Noted  . S/P shoulder replacement 08/29/2012  . Pain in joint, shoulder region 08/29/2012  . Muscle weakness (generalized) 08/29/2012  . Arthritis of shoulder 08/12/2012    Marijo Conception, OTA student [phone]   10/08/2015, 2:41 PM  [place] [address] [place], [place], [address] Phone: [phone]   Fax:  [phone]  Name: Scott Zamora MRN: [id number] Date of Birth: Sep 15, 1934  Ailene Ravel, OTR/L,CBIS  [phone]  This entire session was guided, instructed, and directly supervised by Ailene Ravel, OTR/L, CBIS.

## 2015-10-09 ENCOUNTER — Encounter (HOSPITAL_COMMUNITY): Payer: Self-pay

## 2015-10-09 ENCOUNTER — Ambulatory Visit (HOSPITAL_COMMUNITY): Payer: Medicare Other

## 2015-10-09 DIAGNOSIS — M629 Disorder of muscle, unspecified: Secondary | ICD-10-CM | POA: Diagnosis not present

## 2015-10-09 DIAGNOSIS — M25611 Stiffness of right shoulder, not elsewhere classified: Secondary | ICD-10-CM | POA: Diagnosis not present

## 2015-10-09 DIAGNOSIS — R29898 Other symptoms and signs involving the musculoskeletal system: Secondary | ICD-10-CM

## 2015-10-09 DIAGNOSIS — M25511 Pain in right shoulder: Secondary | ICD-10-CM

## 2015-10-09 DIAGNOSIS — Z96611 Presence of right artificial shoulder joint: Secondary | ICD-10-CM | POA: Diagnosis not present

## 2015-10-09 DIAGNOSIS — M6289 Other specified disorders of muscle: Secondary | ICD-10-CM

## 2015-10-09 NOTE — Therapy (Deleted)
[place], [place], [address] Phone: [phone]   Fax:  [phone]  Occupational Therapy Treatment  Patient Details  Name: Scott Zamora MRN: [id number] Date of Birth: 1935-05-02 Referring Provider: Justice Britain, MD  Encounter Date: 10/09/2015      OT End of Session - 10/09/15 1602    Visit Number 6   Number of Visits 24   Date for OT Re-Evaluation 10/26/15   Authorization Type 1) Medicare 2) [facility] 3)[employer]   Authorization Time Period before 10th visit   Authorization - Visit Number 6   Authorization - Number of Visits 10   OT Start Time 1350   OT Stop Time 1430   OT Time Calculation (min) 40 min   Activity Tolerance Patient tolerated treatment well   Behavior During Therapy [facility] for tasks assessed/performed      Past Medical History  Diagnosis Date  . Hypertension   . Vertigo     hx of  . Sleep apnea     has CPAP but does not wear it  . Seasonal allergies   . Arthritis   . Dry skin   . Inguinal hernia, left   . GERD (gastroesophageal reflux disease)     Past Surgical History  Procedure Laterality Date  . Knee arthroscopy      left knee  . Colonoscopy w/ polypectomy    . Total shoulder arthroplasty  08/11/2012    Dr Justice Britain  . Total shoulder arthroplasty  08/11/2012    Procedure: TOTAL SHOULDER ARTHROPLASTY;  Surgeon: Marin Shutter, MD;  Location: [place];  Service: Orthopedics;  Laterality: Left;  . Inguinal hernia repair Left 04/18/2014    Procedure: HERNIA REPAIR INGUINAL ADULT;  Surgeon: Jamesetta So, MD;  [facility];  Service: General;  Laterality: Left;  . Insertion of mesh Left 04/18/2014    Procedure: INSERTION OF MESH;  Surgeon: Jamesetta So, MD;  Location: [facility];  Service: General;  Laterality: Left;  . Hernia repair    . Eye surgery Bilateral   . Total shoulder arthroplasty Right 09/12/2015    Procedure: RIGHT TOTAL SHOULDER ARTHROPLASTY;  Surgeon: Justice Britain, MD;   Location: [place];  Service: Orthopedics;  Laterality: Right;    There were no vitals filed for this visit.  Visit Diagnosis:  Shoulder pain, right  Shoulder weakness  Shoulder stiffness, right  Tight fascia      Subjective Assessment - 10/08/15 1432    Subjective  S: I don't have any pain today, I don't know why.            [facility] OT Assessment - 10/09/15 1350    Assessment   Diagnosis Right total shoulder arthroplasty   Precautions   Precautions Shoulder   Type of Shoulder Precautions See Protocol.    Shoulder Interventions Shoulder sling/immobilizer   Precaution Comments For comfort and sleep   Required Braces or Orthoses Sling                  OT Treatments/Exercises (OP) - 10/09/15 1350    Exercises   Exercises Shoulder   Shoulder Exercises: Supine   Protraction PROM;10 reps   Horizontal ABduction PROM;10 reps   External Rotation PROM;AAROM;10 reps   Internal Rotation PROM;AAROM;10 reps   Flexion PROM;10 reps   ABduction PROM;10 reps   Shoulder Exercises: Seated   Elevation AROM;10 reps   Extension AROM;10 reps   Row AROM;10 reps   Shoulder  Exercises: Therapy Ball   Flexion 10 reps   ABduction 10 reps   Shoulder Exercises: ROM/Strengthening   Thumb Tacks 1'   Prot/Ret//Elev/Dep 1'   Rhythmic Stabilization, Seated Flexion/extension 30 seconds, internal/external rotation 30 seconds   Shoulder Exercises: Isometric Strengthening   Flexion Supine;3X5"   Extension Supine;3X5"   External Rotation Supine;3X5"   Internal Rotation Limitations no IR per protocol   ABduction Supine;3X5"   ADduction Supine;3X5"   Manual Therapy   Manual Therapy Myofascial release   Manual therapy comments manual therapy completed prior to therapeutic exercise this date   Myofascial Release Myofascial release to right upper arm, trapezius, and scapularis regions to decrease pain and fascial restrictions and increase joint mobility                OT Education  - 10/08/15 1433    Education provided Yes   Education Details Educated patient on weaning off sling. Discussed wearing for comfort and sleep.          OT Short Term Goals - 10/01/15 1518    OT SHORT TERM GOAL #1   Title Patient will be educated and independent with HEP to increase functional use of RUE.   Time 4   Period Weeks   Status On-going   OT SHORT TERM GOAL #2   Title Patient will increase P/ROM of RUE to [facility] to increase ability to get shirts on and off.   Time 4   Period Weeks   Status On-going   OT SHORT TERM GOAL #3   Title Patient will increase RUE strength to 3/5 to increase ability to complete activities below shoulder level.    Time 4   Period Weeks   Status On-going   OT SHORT TERM GOAL #4   Title Patient will decrease pain level to 5/10 when using RUE to increase ability to use RUE for 50% of daily tasks.    Time 4   Period Weeks   Status On-going   OT SHORT TERM GOAL #5   Title Patient will decrease fascial restrictions to mod amount to increase functional mobility of RUE.    Time 4   Period Weeks   Status On-going           OT Long Term Goals - 10/01/15 1518    OT LONG TERM GOAL #1   Title Patient will return to highest level of independence using RUE for all daily and leisure tasks.    Time 8   Period Weeks   Status On-going   OT LONG TERM GOAL #2   Title Patient will increase RUE A/ROM to [facility] to increase ability to complete tasks above shoulder.    Time 8   Period Weeks   Status On-going   OT LONG TERM GOAL #3   Title Patient will increase RUE strength to 4+/5 to increase ability to return to golf and bowling activities.    Time 8   Period Weeks   Status On-going   OT LONG TERM GOAL #4   Title Patient will decrease pain level in RUE during daily tasks to 3/10 or less.   Time 8   Period Weeks   Status On-going   OT LONG TERM GOAL #5   Title Patient will decrease fascial restrictions to min amount to increase functional mobility needed  to return to leisure tasks.    Time 8   Period Weeks   Status On-going  Plan - 10/09/15 1603    Clinical Impression Statement A: Continued to follow protocol, completed AA/ROM and P/ROM within limits, isometric exercises, and added rhythmic stabilization exercises. Therapist notes increased fascial restrictions this session.   Plan P: Continue to follow protocol for weeks 3-6. Resume pulleys in flexion.        Problem List Patient Active Problem List   Diagnosis Date Noted  . S/P shoulder replacement 08/29/2012  . Pain in joint, shoulder region 08/29/2012  . Muscle weakness (generalized) 08/29/2012  . Arthritis of shoulder 08/12/2012    Marijo Conception, OTA student [phone]   10/09/2015, 4:09 PM  [place] [address] [place], [place], [address] Phone: [phone]   Fax:  [phone]  Name: Scott Zamora MRN: [id number] Date of Birth: January 25, 1935

## 2015-10-10 NOTE — Therapy (Signed)
[place], [place], [address] Phone: [phone]   Fax:  [phone]  Occupational Therapy Treatment  Patient Details  Name: Scott Zamora MRN: [id number] Date of Birth: April 05, 1935 Referring Provider: Justice Britain, MD  Encounter Date: 10/09/2015      OT End of Session - 10/09/15 1602    Visit Number 6   Number of Visits 24   Date for OT Re-Evaluation 10/26/15   Authorization Type 1) Medicare 2) [facility] 3)[employer]   Authorization Time Period before 10th visit   Authorization - Visit Number 6   Authorization - Number of Visits 10   OT Start Time 1350   OT Stop Time 1430   OT Time Calculation (min) 40 min   Activity Tolerance Patient tolerated treatment well   Behavior During Therapy [facility] for tasks assessed/performed      Past Medical History  Diagnosis Date  . Hypertension   . Vertigo     hx of  . Sleep apnea     has CPAP but does not wear it  . Seasonal allergies   . Arthritis   . Dry skin   . Inguinal hernia, left   . GERD (gastroesophageal reflux disease)     Past Surgical History  Procedure Laterality Date  . Knee arthroscopy      left knee  . Colonoscopy w/ polypectomy    . Total shoulder arthroplasty  08/11/2012    Dr Justice Britain  . Total shoulder arthroplasty  08/11/2012    Procedure: TOTAL SHOULDER ARTHROPLASTY;  Surgeon: Marin Shutter, MD;  Location: [place];  Service: Orthopedics;  Laterality: Left;  . Inguinal hernia repair Left 04/18/2014    Procedure: HERNIA REPAIR INGUINAL ADULT;  Surgeon: Jamesetta So, MD;  Location: [facility];  Service: General;  Laterality: Left;  . Insertion of mesh Left 04/18/2014    Procedure: INSERTION OF MESH;  Surgeon: Jamesetta So, MD;  Location[facility];  Service: General;  Laterality: Left;  . Hernia repair    . Eye surgery Bilateral   . Total shoulder arthroplasty Right 09/12/2015    Procedure: RIGHT TOTAL SHOULDER ARTHROPLASTY;  Surgeon: Justice Britain, MD;   Location: [place];  Service: Orthopedics;  Laterality: Right;    There were no vitals filed for this visit.  Visit Diagnosis:  Shoulder pain, right  Shoulder weakness  Shoulder stiffness, right  Tight fascia      Subjective Assessment - 10/09/15 1608    Subjective  It hurts more than yesterday.   Currently in Pain? Yes   Pain Score 3    Pain Location Shoulder   Pain Orientation Right   Pain Descriptors / Indicators Aching   Pain Type Surgical pain   Pain Radiating Towards n/a   Pain Onset 1 to 4 weeks ago   Pain Frequency Intermittent   Aggravating Factors  movement   Pain Relieving Factors pain medicine, ice   Effect of Pain on Daily Activities unable to use RUE for daily tasks   Multiple Pain Sites No            [facility] OT Assessment - 10/09/15 1350    Assessment   Diagnosis Right total shoulder arthroplasty   Precautions   Precautions Shoulder   Type of Shoulder Precautions See Protocol.    Shoulder Interventions Shoulder sling/immobilizer   Precaution Comments For comfort and sleep   Required Braces or Orthoses Sling  OT Treatments/Exercises (OP) - 10/09/15 1350    Exercises   Exercises Shoulder   Shoulder Exercises: Supine   Protraction PROM;10 reps   Horizontal ABduction PROM;10 reps   External Rotation PROM;AAROM;10 reps   Internal Rotation PROM;AAROM;10 reps   Flexion PROM;10 reps   ABduction PROM;10 reps   Shoulder Exercises: Seated   Elevation AROM;10 reps   Extension AROM;10 reps   Row AROM;10 reps   Shoulder Exercises: Therapy Ball   Flexion 10 reps   ABduction 10 reps   Shoulder Exercises: ROM/Strengthening   Thumb Tacks 1'   Prot/Ret//Elev/Dep 1'   Rhythmic Stabilization, Seated Flexion/extension 30 seconds, internal/external rotation 30 seconds   Shoulder Exercises: Isometric Strengthening   Flexion Supine;3X5"   Extension Supine;3X5"   External Rotation Supine;3X5"   Internal Rotation Limitations no IR per  protocol   ABduction Supine;3X5"   ADduction Supine;3X5"   Manual Therapy   Manual Therapy Myofascial release   Manual therapy comments manual therapy completed prior to therapeutic exercise this date   Myofascial Release Myofascial release to right upper arm, trapezius, and scapularis regions to decrease pain and fascial restrictions and increase joint mobility                  OT Short Term Goals - 10/01/15 1518    OT SHORT TERM GOAL #1   Title Patient will be educated and independent with HEP to increase functional use of RUE.   Time 4   Period Weeks   Status On-going   OT SHORT TERM GOAL #2   Title Patient will increase P/ROM of RUE to [facility] to increase ability to get shirts on and off.   Time 4   Period Weeks   Status On-going   OT SHORT TERM GOAL #3   Title Patient will increase RUE strength to 3/5 to increase ability to complete activities below shoulder level.    Time 4   Period Weeks   Status On-going   OT SHORT TERM GOAL #4   Title Patient will decrease pain level to 5/10 when using RUE to increase ability to use RUE for 50% of daily tasks.    Time 4   Period Weeks   Status On-going   OT SHORT TERM GOAL #5   Title Patient will decrease fascial restrictions to mod amount to increase functional mobility of RUE.    Time 4   Period Weeks   Status On-going           OT Long Term Goals - 10/01/15 1518    OT LONG TERM GOAL #1   Title Patient will return to highest level of independence using RUE for all daily and leisure tasks.    Time 8   Period Weeks   Status On-going   OT LONG TERM GOAL #2   Title Patient will increase RUE A/ROM to [facility] to increase ability to complete tasks above shoulder.    Time 8   Period Weeks   Status On-going   OT LONG TERM GOAL #3   Title Patient will increase RUE strength to 4+/5 to increase ability to return to golf and bowling activities.    Time 8   Period Weeks   Status On-going   OT LONG TERM GOAL #4   Title Patient  will decrease pain level in RUE during daily tasks to 3/10 or less.   Time 8   Period Weeks   Status On-going   OT LONG TERM GOAL #5  Title Patient will decrease fascial restrictions to min amount to increase functional mobility needed to return to leisure tasks.    Time 8   Period Weeks   Status On-going               Plan - 10/09/15 1603    Clinical Impression Statement A: Continued to follow protocol, completed AA/ROM and P/ROM within limits, isometric exercises, and added rhythmic stabilization exercises. Therapist notes increased fascial restrictions in RUE this session. Patient demonstrates min difficulty with fully relaxing muscles during passive stretching despite cues from therapist.   Plan P: Continue to follow protocol for weeks 3-6. Resume pulleys in flexion.        Problem List Patient Active Problem List   Diagnosis Date Noted  . S/P shoulder replacement 08/29/2012  . Pain in joint, shoulder region 08/29/2012  . Muscle weakness (generalized) 08/29/2012  . Arthritis of shoulder 08/12/2012    Marijo Conception, OTA student [phone]  10/10/2015, 7:58 AM  [place] [address] [place], [place], [address] Phone: [phone]   Fax:  [phone]  Name: GABRIELL CASIMIR MRN: [id number] Date of Birth: 07-01-1935  Ailene Ravel, OTR/L,CBIS  [phone]  This entire session was guided, instructed, and directly supervised by Ailene Ravel, OTR/L, CBIS.

## 2015-10-11 ENCOUNTER — Encounter (HOSPITAL_COMMUNITY): Payer: Self-pay | Admitting: Occupational Therapy

## 2015-10-11 ENCOUNTER — Ambulatory Visit (HOSPITAL_COMMUNITY): Payer: Medicare Other | Admitting: Occupational Therapy

## 2015-10-11 DIAGNOSIS — M25511 Pain in right shoulder: Secondary | ICD-10-CM | POA: Diagnosis not present

## 2015-10-11 DIAGNOSIS — M629 Disorder of muscle, unspecified: Secondary | ICD-10-CM

## 2015-10-11 DIAGNOSIS — R29898 Other symptoms and signs involving the musculoskeletal system: Secondary | ICD-10-CM | POA: Diagnosis not present

## 2015-10-11 DIAGNOSIS — M25611 Stiffness of right shoulder, not elsewhere classified: Secondary | ICD-10-CM | POA: Diagnosis not present

## 2015-10-11 DIAGNOSIS — M6289 Other specified disorders of muscle: Secondary | ICD-10-CM

## 2015-10-11 DIAGNOSIS — Z96611 Presence of right artificial shoulder joint: Secondary | ICD-10-CM | POA: Diagnosis not present

## 2015-10-11 NOTE — Therapy (Signed)
[place], [place], [address] Phone: [phone]   Fax:  [phone]  Occupational Therapy Treatment  Patient Details  Name: Scott Zamora MRN: [id number] Date of Birth: 12/14/1934 Referring Provider: Justice Britain, MD  Encounter Date: 10/11/2015      OT End of Session - 10/11/15 1525    Visit Number 7   Number of Visits 24   Date for OT Re-Evaluation 10/26/15   Authorization Type 1) Medicare 2) [facility] 3)[employer]   Authorization Time Period before 10th visit   Authorization - Visit Number 7   Authorization - Number of Visits 10   OT Start Time 1300   OT Stop Time 1343   OT Time Calculation (min) 43 min   Activity Tolerance Patient tolerated treatment well   Behavior During Therapy [facility] for tasks assessed/performed      Past Medical History  Diagnosis Date  . Hypertension   . Vertigo     hx of  . Sleep apnea     has CPAP but does not wear it  . Seasonal allergies   . Arthritis   . Dry skin   . Inguinal hernia, left   . GERD (gastroesophageal reflux disease)     Past Surgical History  Procedure Laterality Date  . Knee arthroscopy      left knee  . Colonoscopy w/ polypectomy    . Total shoulder arthroplasty  08/11/2012    Dr Justice Britain  . Total shoulder arthroplasty  08/11/2012    Procedure: TOTAL SHOULDER ARTHROPLASTY;  Surgeon: Marin Shutter, MD;  Location: [place];  Service: Orthopedics;  Laterality: Left;  . Inguinal hernia repair Left 04/18/2014    Procedure: HERNIA REPAIR INGUINAL ADULT;  Surgeon: Jamesetta So, MD;  Location: [facility];  Service: General;  Laterality: Left;  . Insertion of mesh Left 04/18/2014    Procedure: INSERTION OF MESH;  Surgeon: Jamesetta So, MD;  Location: [facility];  Service: General;  Laterality: Left;  . Hernia repair    . Eye surgery Bilateral   . Total shoulder arthroplasty Right 09/12/2015    Procedure: RIGHT TOTAL SHOULDER ARTHROPLASTY;  Surgeon: Justice Britain, MD;   Location: [place];  Service: Orthopedics;  Laterality: Right;    There were no vitals filed for this visit.  Visit Diagnosis:  Shoulder pain, right  Shoulder weakness  Shoulder stiffness, right  Tight fascia      Subjective Assessment - 10/11/15 1300    Subjective  S: I think I moved wrong last night, my arm was hurting when I woke up.    Currently in Pain? No/denies            [facility] OT Assessment - 10/11/15 1259    Assessment   Diagnosis Right total shoulder arthroplasty   Precautions   Precautions Shoulder   Type of Shoulder Precautions See Protocol.    Shoulder Interventions Shoulder sling/immobilizer   Required Braces or Orthoses Sling                  OT Treatments/Exercises (OP) - 10/11/15 1301    Exercises   Exercises Shoulder   Shoulder Exercises: Supine   Protraction PROM;10 reps   Horizontal ABduction PROM;10 reps   External Rotation PROM;AAROM;10 reps   Internal Rotation PROM;AAROM;10 reps   Flexion PROM;10 reps   ABduction PROM;10 reps   Shoulder Exercises: Seated   Elevation AROM;15 reps   Extension AROM;15 reps   Row AROM;15  reps   Shoulder Exercises: Pulleys   Flexion 1 minute   Shoulder Exercises: Therapy Ball   Flexion 15 reps   ABduction 15 reps   Shoulder Exercises: ROM/Strengthening   Thumb Tacks 1'   Prot/Ret//Elev/Dep 1'   Shoulder Exercises: Isometric Strengthening   Flexion Supine;5X5"   Extension Supine;5X5"   External Rotation Supine;5X5"   Internal Rotation Limitations no IR per protocol   ABduction Supine;5X5"   ADduction Supine;5X5"   Manual Therapy   Manual Therapy Myofascial release   Manual therapy comments manual therapy completed prior to therapeutic exercise this date   Myofascial Release Myofascial release to right upper arm, trapezius, and scapularis regions to decrease pain and fascial restrictions and increase joint mobility                  OT Short Term Goals - 10/01/15 1518    OT SHORT  TERM GOAL #1   Title Patient will be educated and independent with HEP to increase functional use of RUE.   Time 4   Period Weeks   Status On-going   OT SHORT TERM GOAL #2   Title Patient will increase P/ROM of RUE to [facility] to increase ability to get shirts on and off.   Time 4   Period Weeks   Status On-going   OT SHORT TERM GOAL #3   Title Patient will increase RUE strength to 3/5 to increase ability to complete activities below shoulder level.    Time 4   Period Weeks   Status On-going   OT SHORT TERM GOAL #4   Title Patient will decrease pain level to 5/10 when using RUE to increase ability to use RUE for 50% of daily tasks.    Time 4   Period Weeks   Status On-going   OT SHORT TERM GOAL #5   Title Patient will decrease fascial restrictions to mod amount to increase functional mobility of RUE.    Time 4   Period Weeks   Status On-going           OT Long Term Goals - 10/01/15 1518    OT LONG TERM GOAL #1   Title Patient will return to highest level of independence using RUE for all daily and leisure tasks.    Time 8   Period Weeks   Status On-going   OT LONG TERM GOAL #2   Title Patient will increase RUE A/ROM to [facility] to increase ability to complete tasks above shoulder.    Time 8   Period Weeks   Status On-going   OT LONG TERM GOAL #3   Title Patient will increase RUE strength to 4+/5 to increase ability to return to golf and bowling activities.    Time 8   Period Weeks   Status On-going   OT LONG TERM GOAL #4   Title Patient will decrease pain level in RUE during daily tasks to 3/10 or less.   Time 8   Period Weeks   Status On-going   OT LONG TERM GOAL #5   Title Patient will decrease fascial restrictions to min amount to increase functional mobility needed to return to leisure tasks.    Time 8   Period Weeks   Status On-going               Plan - 10/11/15 1525    Clinical Impression Statement A: Resumed pulleys this session, increased isometrics  to 5X5", increased scapular A/ROM and therapy ball repetitions to  15. Pt continues to have increased fascial restrictions in RUE limiting mobility. Pt requried occasional verbal & visual cuing for form during exercises, tactile cuing for scapular retraction.    Plan P: Continue to follow protocol for weeks 3-6, focusing on correct form during exercises & independence in prot/ret/elev/dep        Problem List Patient Active Problem List   Diagnosis Date Noted  . S/P shoulder replacement 08/29/2012  . Pain in joint, shoulder region 08/29/2012  . Muscle weakness (generalized) 08/29/2012  . Arthritis of shoulder 08/12/2012    Guadelupe Sabin, OTR/L  [phone]  10/11/2015, 3:29 PM  [place], [place], [address] Phone: [phone]   Fax:  [phone]  Name: KELIN BORUM MRN: [id number] Date of Birth: Dec 27, 1934

## 2015-10-15 ENCOUNTER — Encounter (HOSPITAL_COMMUNITY): Payer: Self-pay

## 2015-10-15 ENCOUNTER — Ambulatory Visit (HOSPITAL_COMMUNITY): Payer: Medicare Other | Attending: Orthopedic Surgery

## 2015-10-15 DIAGNOSIS — Z96611 Presence of right artificial shoulder joint: Secondary | ICD-10-CM | POA: Diagnosis not present

## 2015-10-15 DIAGNOSIS — M6289 Other specified disorders of muscle: Secondary | ICD-10-CM

## 2015-10-15 DIAGNOSIS — M25511 Pain in right shoulder: Secondary | ICD-10-CM | POA: Diagnosis not present

## 2015-10-15 DIAGNOSIS — R29898 Other symptoms and signs involving the musculoskeletal system: Secondary | ICD-10-CM | POA: Insufficient documentation

## 2015-10-15 DIAGNOSIS — M25611 Stiffness of right shoulder, not elsewhere classified: Secondary | ICD-10-CM | POA: Insufficient documentation

## 2015-10-15 DIAGNOSIS — M6281 Muscle weakness (generalized): Secondary | ICD-10-CM | POA: Insufficient documentation

## 2015-10-15 DIAGNOSIS — M629 Disorder of muscle, unspecified: Secondary | ICD-10-CM | POA: Diagnosis not present

## 2015-10-15 NOTE — Therapy (Signed)
Scott Zamora, [place], [address] Phone: [phone]   Fax:  [phone]  Occupational Therapy Treatment  Patient Details  Name: Scott Zamora MRN: [id number] Date of Birth: 21-Sep-1934 Referring Provider: Justice Britain, MD  Encounter Date: 10/15/2015      OT End of Session - 10/15/15 1125    Visit Number 8   Number of Visits 24   Date for OT Re-Evaluation 10/26/15   Authorization Type 1) Medicare 2) [facility] 3)[employer]   Authorization Time Period before 10th visit   Authorization - Visit Number 8   Authorization - Number of Visits 10   OT Start Time 1020   OT Stop Time 1100   OT Time Calculation (min) 40 min   Activity Tolerance Patient tolerated treatment well   Behavior During Therapy [facility] for tasks assessed/performed      Past Medical History  Diagnosis Date  . Hypertension   . Vertigo     hx of  . Sleep apnea     has CPAP but does not wear it  . Seasonal allergies   . Arthritis   . Dry skin   . Inguinal hernia, left   . GERD (gastroesophageal reflux disease)     Past Surgical History  Procedure Laterality Date  . Knee arthroscopy      left knee  . Colonoscopy w/ polypectomy    . Total shoulder arthroplasty  08/11/2012    Dr Justice Britain  . Total shoulder arthroplasty  08/11/2012    Procedure: TOTAL SHOULDER ARTHROPLASTY;  Surgeon: Marin Shutter, MD;  Location: [place];  Service: Orthopedics;  Laterality: Left;  . Inguinal hernia repair Left 04/18/2014    Procedure: HERNIA REPAIR INGUINAL ADULT;  Surgeon: Jamesetta So, MD;  [facility];  Service: General;  Laterality: Left;  . Insertion of mesh Left 04/18/2014    Procedure: INSERTION OF MESH;  Surgeon: Jamesetta So, MD;  Location: [facility];  Service: General;  Laterality: Left;  . Hernia repair    . Eye surgery Bilateral   . Total shoulder arthroplasty Right 09/12/2015    Procedure: RIGHT TOTAL SHOULDER ARTHROPLASTY;  Surgeon: Justice Britain, MD;   Location: [place];  Service: Orthopedics;  Laterality: Right;    There were no vitals filed for this visit.  Visit Diagnosis:  Shoulder pain, right  Shoulder weakness  Shoulder stiffness, right  Tight fascia  S/P shoulder replacement, right      Subjective Assessment - 10/15/15 1030    Subjective  S: I am having a really hard time getting comfortable for sleeping at night.   Currently in Pain? Yes   Pain Score 3    Pain Location Shoulder   Pain Orientation Right   Pain Descriptors / Indicators Aching   Pain Type Acute pain   Pain Onset 1 to 4 weeks ago   Pain Frequency Intermittent   Aggravating Factors  Moving it, sleeping   Pain Relieving Factors Ice, pain medicine   Effect of Pain on Daily Activities Cannot use RUE for daily tasks                      OT Treatments/Exercises (OP) - 10/15/15 1021    Exercises   Exercises Shoulder   Shoulder Exercises: Supine   Protraction PROM;5 reps   Horizontal ABduction PROM;5 reps   External Rotation PROM;5 reps;AAROM;10 reps   Internal Rotation PROM;5 reps;AAROM;10 reps   Flexion PROM;5 reps;AAROM;10  reps   ABduction PROM;5 reps   Shoulder Exercises: Pulleys   Flexion 1 minute   Shoulder Exercises: Therapy Ball   Flexion 15 reps   ABduction 15 reps   Shoulder Exercises: ROM/Strengthening   Thumb Tacks 1'   Prot/Ret//Elev/Dep 1'   Rhythmic Stabilization, Seated Flexion/extension 30 seconds, internal/external rotation 30 seconds   Manual Therapy   Manual Therapy Myofascial release   Manual therapy comments manual therapy completed prior to therapeutic exercise this date   Myofascial Release Myofascial release to right upper arm, trapezius, and scapularis regions to decrease pain and fascial restrictions and increase joint mobility                OT Education - 10/15/15 1125    Education provided Yes   Education Details Educated patient on comfortable sleeping positions.   Person(s) Educated  Patient   Methods Explanation   Comprehension Verbalized understanding          OT Short Term Goals - 10/01/15 1518    OT SHORT TERM GOAL #1   Title Patient will be educated and independent with HEP to increase functional use of RUE.   Time 4   Period Weeks   Status On-going   OT SHORT TERM GOAL #2   Title Patient will increase P/ROM of RUE to [facility] to increase ability to get shirts on and off.   Time 4   Period Weeks   Status On-going   OT SHORT TERM GOAL #3   Title Patient will increase RUE strength to 3/5 to increase ability to complete activities below shoulder level.    Time 4   Period Weeks   Status On-going   OT SHORT TERM GOAL #4   Title Patient will decrease pain level to 5/10 when using RUE to increase ability to use RUE for 50% of daily tasks.    Time 4   Period Weeks   Status On-going   OT SHORT TERM GOAL #5   Title Patient will decrease fascial restrictions to mod amount to increase functional mobility of RUE.    Time 4   Period Weeks   Status On-going           OT Long Term Goals - 10/01/15 1518    OT LONG TERM GOAL #1   Title Patient will return to highest level of independence using RUE for all daily and leisure tasks.    Time 8   Period Weeks   Status On-going   OT LONG TERM GOAL #2   Title Patient will increase RUE A/ROM to [facility] to increase ability to complete tasks above shoulder.    Time 8   Period Weeks   Status On-going   OT LONG TERM GOAL #3   Title Patient will increase RUE strength to 4+/5 to increase ability to return to golf and bowling activities.    Time 8   Period Weeks   Status On-going   OT LONG TERM GOAL #4   Title Patient will decrease pain level in RUE during daily tasks to 3/10 or less.   Time 8   Period Weeks   Status On-going   OT LONG TERM GOAL #5   Title Patient will decrease fascial restrictions to min amount to increase functional mobility needed to return to leisure tasks.    Time 8   Period Weeks   Status  On-going               Plan - 10/15/15 1126  Clinical Impression Statement A: Patient reports difficulty with speeling and increased pain during the night. Therapist provided education on comfortable sleeping positions. Patient also reports decrease in grip strength in right hand. Therapist noted increased fascial restictions in upper arm and trapezious region. Initiated AA/ROM per protocol this session.   Plan P: Continue to follow protocol for weeks 3-6. Add in hand gripper exercises at table.        Problem List Patient Active Problem List   Diagnosis Date Noted  . S/P shoulder replacement 08/29/2012  . Pain in joint, shoulder region 08/29/2012  . Muscle weakness (generalized) 08/29/2012  . Arthritis of shoulder 08/12/2012    Ailene Ravel, OTR/L,CBIS  [phone]  10/15/2015, 11:33 AM  [place], [place], [address] Phone: [phone]   Fax:  [phone]  Name: LAYKEN DOENGES MRN: [id number] Date of Birth: [birth year]

## 2015-10-16 ENCOUNTER — Ambulatory Visit (HOSPITAL_COMMUNITY): Payer: Medicare Other | Admitting: Occupational Therapy

## 2015-10-16 ENCOUNTER — Encounter (HOSPITAL_COMMUNITY): Payer: Self-pay | Admitting: Occupational Therapy

## 2015-10-16 DIAGNOSIS — M6289 Other specified disorders of muscle: Secondary | ICD-10-CM

## 2015-10-16 DIAGNOSIS — M25511 Pain in right shoulder: Secondary | ICD-10-CM | POA: Diagnosis not present

## 2015-10-16 DIAGNOSIS — M629 Disorder of muscle, unspecified: Secondary | ICD-10-CM | POA: Diagnosis not present

## 2015-10-16 DIAGNOSIS — M25611 Stiffness of right shoulder, not elsewhere classified: Secondary | ICD-10-CM

## 2015-10-16 DIAGNOSIS — R29898 Other symptoms and signs involving the musculoskeletal system: Secondary | ICD-10-CM | POA: Diagnosis not present

## 2015-10-16 DIAGNOSIS — Z96611 Presence of right artificial shoulder joint: Secondary | ICD-10-CM | POA: Diagnosis not present

## 2015-10-16 DIAGNOSIS — M6281 Muscle weakness (generalized): Secondary | ICD-10-CM | POA: Diagnosis not present

## 2015-10-16 NOTE — Therapy (Signed)
[place], [place], [address] Phone: [phone]   Fax:  [phone]  Occupational Therapy Treatment  Patient Details  Name: Scott Zamora MRN: [id number] Date of Birth: 11-13-1934 Referring Provider: Justice Britain, MD  Encounter Date: 10/16/2015      OT End of Session - 10/16/15 1104    Visit Number 9   Number of Visits 24   Date for OT Re-Evaluation 10/26/15   Authorization Type 1) Medicare 2) [facility] 3)[employer]   Authorization Time Period before 10th visit   Authorization - Visit Number 9   Authorization - Number of Visits 10   OT Start Time 0930   OT Stop Time 1013   OT Time Calculation (min) 43 min   Activity Tolerance Patient tolerated treatment well   Behavior During Therapy [facility] for tasks assessed/performed      Past Medical History  Diagnosis Date  . Hypertension   . Vertigo     hx of  . Sleep apnea     has CPAP but does not wear it  . Seasonal allergies   . Arthritis   . Dry skin   . Inguinal hernia, left   . GERD (gastroesophageal reflux disease)     Past Surgical History  Procedure Laterality Date  . Knee arthroscopy      left knee  . Colonoscopy w/ polypectomy    . Total shoulder arthroplasty  08/11/2012    Dr Justice Britain  . Total shoulder arthroplasty  08/11/2012    Procedure: TOTAL SHOULDER ARTHROPLASTY;  Surgeon: Marin Shutter, MD;  Location: [place];  Service: Orthopedics;  Laterality: Left;  . Inguinal hernia repair Left 04/18/2014    Procedure: HERNIA REPAIR INGUINAL ADULT;  Surgeon: Jamesetta So, MD;  Location[facility];  Service: General;  Laterality: Left;  . Insertion of mesh Left 04/18/2014    Procedure: INSERTION OF MESH;  Surgeon: Jamesetta So, MD;  Location[facility];  Service: General;  Laterality: Left;  . Hernia repair    . Eye surgery Bilateral   . Total shoulder arthroplasty Right 09/12/2015    Procedure: RIGHT TOTAL SHOULDER ARTHROPLASTY;  Surgeon: Justice Britain, MD;   Location: [place];  Service: Orthopedics;  Laterality: Right;    There were no vitals filed for this visit.  Visit Diagnosis:  Shoulder pain, right  Shoulder weakness  Shoulder stiffness, right  Tight fascia      Subjective Assessment - 10/16/15 0934    Subjective  S: I slept better last night, I took some Tylenol before I went to bed.    Currently in Pain? No/denies            [facility] OT Assessment - 10/16/15 0933    Assessment   Diagnosis Right total shoulder arthroplasty   Precautions   Precautions Shoulder   Type of Shoulder Precautions See Protocol.    Shoulder Interventions Shoulder sling/immobilizer   Required Braces or Orthoses Sling                  OT Treatments/Exercises (OP) - 10/16/15 0934    Exercises   Exercises Shoulder   Shoulder Exercises: Supine   Protraction PROM;5 reps   Horizontal ABduction PROM;5 reps   External Rotation PROM;5 reps;AAROM;10 reps   Internal Rotation PROM;5 reps;AAROM;10 reps   Flexion PROM;5 reps;AAROM;10 reps   ABduction PROM;5 reps   Shoulder Exercises: Seated   Other Seated Exercises Pt completed hand gripper exercises sliding beads  from IR to ER on table top. 18 repetitions total   Shoulder Exercises: Pulleys   Flexion 1 minute   Shoulder Exercises: Therapy Ball   Flexion 15 reps   ABduction 15 reps   Shoulder Exercises: ROM/Strengthening   Thumb Tacks 1'   Prot/Ret//Elev/Dep 1'   Neurological Re-education Exercises   Hand Gripper with Large Beads 6/6 gripper set at 15#   Hand Gripper with Medium Beads 6/6 gripper set at 15#   Hand Gripper with Small Beads 6/6 gripper set at 15#   Manual Therapy   Manual Therapy Myofascial release   Manual therapy comments manual therapy completed prior to therapeutic exercise this date   Myofascial Release Myofascial release to right upper arm, trapezius, and scapularis regions to decrease pain and fascial restrictions and increase joint mobility                 OT Education - 10/15/15 1125    Education provided Yes   Education Details Educated patient on comfortable sleeping positions.   Person(s) Educated Patient   Methods Explanation   Comprehension Verbalized understanding          OT Short Term Goals - 10/01/15 1518    OT SHORT TERM GOAL #1   Title Patient will be educated and independent with HEP to increase functional use of RUE.   Time 4   Period Weeks   Status On-going   OT SHORT TERM GOAL #2   Title Patient will increase P/ROM of RUE to [facility] to increase ability to get shirts on and off.   Time 4   Period Weeks   Status On-going   OT SHORT TERM GOAL #3   Title Patient will increase RUE strength to 3/5 to increase ability to complete activities below shoulder level.    Time 4   Period Weeks   Status On-going   OT SHORT TERM GOAL #4   Title Patient will decrease pain level to 5/10 when using RUE to increase ability to use RUE for 50% of daily tasks.    Time 4   Period Weeks   Status On-going   OT SHORT TERM GOAL #5   Title Patient will decrease fascial restrictions to mod amount to increase functional mobility of RUE.    Time 4   Period Weeks   Status On-going           OT Long Term Goals - 10/01/15 1518    OT LONG TERM GOAL #1   Title Patient will return to highest level of independence using RUE for all daily and leisure tasks.    Time 8   Period Weeks   Status On-going   OT LONG TERM GOAL #2   Title Patient will increase RUE A/ROM to [facility] to increase ability to complete tasks above shoulder.    Time 8   Period Weeks   Status On-going   OT LONG TERM GOAL #3   Title Patient will increase RUE strength to 4+/5 to increase ability to return to golf and bowling activities.    Time 8   Period Weeks   Status On-going   OT LONG TERM GOAL #4   Title Patient will decrease pain level in RUE during daily tasks to 3/10 or less.   Time 8   Period Weeks   Status On-going   OT LONG TERM GOAL #5   Title Patient will  decrease fascial restrictions to min amount to increase functional mobility needed to return to  leisure tasks.    Time 8   Period Weeks   Status On-going               Plan - 10/16/15 1104    Clinical Impression Statement A: Added hand gripper exercise on table top working while simultaneously working on [employer]. Pt continued AA/ROM per protocol this session, completing with good form, intermittent verbal cuing required. Pt reports he was able to sleep better last night as he took Tylenol prior to going to bed, continues to have difficulty keeping sling on at night.    Plan P: UPDATE G-CODE. Continue to follow protocol, cont hand gripper exercises working on grip strength and shoulder ER/IR-increase gripper resistance to 20#        Problem List Patient Active Problem List   Diagnosis Date Noted  . S/P shoulder replacement 08/29/2012  . Pain in joint, shoulder region 08/29/2012  . Muscle weakness (generalized) 08/29/2012  . Arthritis of shoulder 08/12/2012    Guadelupe Sabin, OTR/L  [phone]  10/16/2015, 11:07 AM  [place], [place], [address] Phone: [phone]   Fax:  [phone]  Name: JONATHANDAVID MARLETT MRN: [id number] Date of Birth: 1934/07/22

## 2015-10-18 ENCOUNTER — Ambulatory Visit (HOSPITAL_COMMUNITY): Payer: Medicare Other

## 2015-10-18 ENCOUNTER — Encounter (HOSPITAL_COMMUNITY): Payer: Self-pay

## 2015-10-18 DIAGNOSIS — M6281 Muscle weakness (generalized): Secondary | ICD-10-CM | POA: Diagnosis not present

## 2015-10-18 DIAGNOSIS — M25611 Stiffness of right shoulder, not elsewhere classified: Secondary | ICD-10-CM

## 2015-10-18 DIAGNOSIS — R29898 Other symptoms and signs involving the musculoskeletal system: Secondary | ICD-10-CM | POA: Diagnosis not present

## 2015-10-18 DIAGNOSIS — Z96611 Presence of right artificial shoulder joint: Secondary | ICD-10-CM | POA: Diagnosis not present

## 2015-10-18 DIAGNOSIS — M629 Disorder of muscle, unspecified: Secondary | ICD-10-CM | POA: Diagnosis not present

## 2015-10-18 DIAGNOSIS — M25511 Pain in right shoulder: Secondary | ICD-10-CM | POA: Diagnosis not present

## 2015-10-18 NOTE — Therapy (Signed)
[place], [place], [address] Phone: [phone]   Fax:  [phone]  Occupational Therapy Treatment  Patient Details  Name: Scott Zamora MRN: [id number] Date of Birth: [birth year] Referring Provider: Justice Britain, MD  Encounter Date: 10/18/2015      OT End of Session - 10/18/15 1138    Visit Number 10   Number of Visits 24   Date for OT Re-Evaluation 10/26/15   Authorization Type 1) Medicare 2) [facility] 3)[employer]   Authorization Time Period before 20th visit   Authorization - Visit Number 10   Authorization - Number of Visits 20   OT Start Time 1015   OT Stop Time 1100   OT Time Calculation (min) 45 min   Activity Tolerance Patient tolerated treatment well   Behavior During Therapy [facility] for tasks assessed/performed      Past Medical History  Diagnosis Date  . Hypertension   . Vertigo     hx of  . Sleep apnea     has CPAP but does not wear it  . Seasonal allergies   . Arthritis   . Dry skin   . Inguinal hernia, left   . GERD (gastroesophageal reflux disease)     Past Surgical History  Procedure Laterality Date  . Knee arthroscopy      left knee  . Colonoscopy w/ polypectomy    . Total shoulder arthroplasty  08/11/2012    Dr Justice Britain  . Total shoulder arthroplasty  08/11/2012    Procedure: TOTAL SHOULDER ARTHROPLASTY;  Surgeon: Marin Shutter, MD;  Location: [place];  Service: Orthopedics;  Laterality: Left;  . Inguinal hernia repair Left 04/18/2014    Procedure: HERNIA REPAIR INGUINAL ADULT;  Surgeon: Jamesetta So, MD;  [facility];  Service: General;  Laterality: Left;  . Insertion of mesh Left 04/18/2014    Procedure: INSERTION OF MESH;  Surgeon: Jamesetta So, MD;  [facility]: [facility];  Service: General;  Laterality: Left;  . Hernia repair    . Eye surgery Bilateral   . Total shoulder arthroplasty Right 09/12/2015    Procedure: RIGHT TOTAL SHOULDER ARTHROPLASTY;  Surgeon: Justice Britain, MD;   Location: [place];  Service: Orthopedics;  Laterality: Right;    There were no vitals filed for this visit.      Subjective Assessment - 10/18/15 1027    Subjective  S: What can do with this shoulder and what shouldn't I do?   Limitations Protocol: Week 0-4: Passive ROM to 90, External rotation to 30, IR to 90/abdomen, Abduction to 60, Start isometrics at day 10, pulleys in flexion. No resisted internal rotation x6 weeks. Week 3-6: Initiate AA/ROM. Progress flexion 90-100, external rotation to 30 degrees, internal rotation to 45, rhythmic stabilization exercises. Pulleys.  Week 6-8: Cont. AA/ROM. Flexion to tolerance, external and internal rotation to 90 in abduction, all motions to tolerance. A/ROM flexion, strengthening theraband exercises, sidelying A/ROM. Week 9-12: Progress to strengthening.    Special Tests FOTO score: 46/100   Currently in Pain? Yes   Pain Score 3    Pain Location Shoulder   Pain Orientation Right   Pain Descriptors / Indicators Aching   Pain Type Acute pain   Pain Onset 1 to 4 weeks ago   Pain Frequency Intermittent            [facility] OT Assessment - 10/18/15 1037    Assessment   Diagnosis Right total shoulder arthroplasty  Precautions   Precautions Shoulder   Type of Shoulder Precautions See Protocol.    Shoulder Interventions Shoulder sling/immobilizer   Precaution Comments For comfort and sleep   Required Braces or Orthoses Sling                  OT Treatments/Exercises (OP) - 10/18/15 1037    Exercises   Exercises Shoulder   Shoulder Exercises: Supine   Protraction PROM;5 reps;AAROM;10 reps   Horizontal ABduction PROM;5 reps   External Rotation PROM;5 reps;AAROM;10 reps   Internal Rotation PROM;5 reps;AAROM;10 reps   Flexion PROM;5 reps;AAROM;10 reps   ABduction PROM;5 reps   Shoulder Exercises: Standing   Protraction AAROM;10 reps   External Rotation AAROM;10 reps   Internal Rotation AAROM;10 reps   Flexion AAROM;10 reps    Shoulder Exercises: Isometric Strengthening   Flexion 5X5"  standing   Extension 5X5"  standing   External Rotation 5X5"  standing   Internal Rotation Limitations no IR per protocol   ABduction 5X5"  standing   ADduction 5X5"  standing   Manual Therapy   Manual Therapy Myofascial release;Muscle Energy Technique   Manual therapy comments manual therapy completed prior to therapeutic exercise this date   Myofascial Release Myofascial release to right upper arm, trapezius, and scapularis regions to decrease pain and fascial restrictions and increase joint mobility   Muscle Energy Technique Muscle energry technique to right anterior deltoid to decrease muscle spasms and increase joint ROM.                 OT Education - 10/18/15 1036    Education provided Yes   Education Details Provided HEP with AA/ROM and isometric exercises.   Person(s) Educated Patient   Methods Explanation;Demonstration;Handout   Comprehension Verbalized understanding;Returned demonstration          OT Short Term Goals - 10/01/15 1518    OT SHORT TERM GOAL #1   Title Patient will be educated and independent with HEP to increase functional use of RUE.   Time 4   Period Weeks   Status On-going   OT SHORT TERM GOAL #2   Title Patient will increase P/ROM of RUE to [facility] to increase ability to get shirts on and off.   Time 4   Period Weeks   Status On-going   OT SHORT TERM GOAL #3   Title Patient will increase RUE strength to 3/5 to increase ability to complete activities below shoulder level.    Time 4   Period Weeks   Status On-going   OT SHORT TERM GOAL #4   Title Patient will decrease pain level to 5/10 when using RUE to increase ability to use RUE for 50% of daily tasks.    Time 4   Period Weeks   Status On-going   OT SHORT TERM GOAL #5   Title Patient will decrease fascial restrictions to mod amount to increase functional mobility of RUE.    Time 4   Period Weeks   Status On-going            OT Long Term Goals - 10/01/15 1518    OT LONG TERM GOAL #1   Title Patient will return to highest level of independence using RUE for all daily and leisure tasks.    Time 8   Period Weeks   Status On-going   OT LONG TERM GOAL #2   Title Patient will increase RUE A/ROM to [facility] to increase ability to complete tasks above shoulder.  Time 8   Period Weeks   Status On-going   OT LONG TERM GOAL #3   Title Patient will increase RUE strength to 4+/5 to increase ability to return to golf and bowling activities.    Time 8   Period Weeks   Status On-going   OT LONG TERM GOAL #4   Title Patient will decrease pain level in RUE during daily tasks to 3/10 or less.   Time 8   Period Weeks   Status On-going   OT LONG TERM GOAL #5   Title Patient will decrease fascial restrictions to min amount to increase functional mobility needed to return to leisure tasks.    Time 8   Period Weeks   Status On-going               Plan - 10-26-15 1139    Clinical Impression Statement A: G code updated. HEP updated as patient will be in [place] and unable to return to therapy for a week. Education provided to complete isometrics at wall. Initiated AA/ROM standing exercises per protocol. Patient reports that he is doing very little with his right arm since he's not allowed to. Continues to have deficits related to strength, ROM and fascial  restrictions.    Plan P: Reassessment. Continue to follow protocol. Complete all AA/ROM ranges supine and standing. Complete supine A/ROM flexion; if unable complete sidelying. Complete IR/er strengthening with theraband. Add scapular strengthening with theraband. Prone rowing and extension. Sidelying external rotation.      Patient will benefit from skilled therapeutic intervention in order to improve the following deficits and impairments:     Visit Diagnosis: Muscle weakness (generalized)  Pain in right shoulder  Stiffness of right shoulder  joint      G-Codes - October 26, 2015 1053    Functional Assessment Tool Used FOTO score: 46/100 (54% impaired)   Functional Limitation Carrying, moving and handling objects   Carrying, Moving and Handling Objects Current Status [id number]) At least 40 percent but less than 60 percent impaired, limited or restricted   Carrying, Moving and Handling Objects Goal Status [id number]) At least 20 percent but less than 40 percent impaired, limited or restricted      Problem List Patient Active Problem List   Diagnosis Date Noted  . S/P shoulder replacement 08/29/2012  . Pain in joint, shoulder region 08/29/2012  . Muscle weakness (generalized) 08/29/2012  . Arthritis of shoulder 08/12/2012    Ailene Ravel, OTR/L,CBIS  [phone]  10/26/15, 11:48 AM  [place], [place], [address] Phone: [phone]   Fax:  [phone]  Name: Scott Zamora MRN: [id number] Date of Birth: 24-May-1935

## 2015-10-18 NOTE — Patient Instructions (Signed)
Shoulder FLEXION - STANDING - PALMS UP  In the standing position, hold a wand/cane with both arms, palms up on both sides. Raise up the wand/cane allowing your unaffected arm to perform most of the effort. Your affected arm should be partially relaxed.  10-12 reps    Internal/External ROTATION - STANDING  In the standing position, hold a wand/cane with both hands keeping your elbows bent. Move your arms and wand/cane to one side.  Your affected arm should be partially relaxed while your unaffected arm performs most of the effort.  10-12 reps     Flexion (Isometric)    Press right fist against wall. Hold 3 seconds and relax. Complete 3 times.   Extension (Isometric)    Place left bent elbow and back of arm against wall. Hold 3 seconds and relax. Complete 3 times.   Adduction (Isometric)    Press upper arm against side of trunk. Use this motion to hold a towel. Hold 3 seconds and relax. Complete 3 times.   SHOULDER: External Rotation (Isometric)    Using wall as resistance, press back of wrist against pillow. Hold 3 seconds and relax. Complete 3 times.  SHOULDER: Abduction (Isometric)    Use wall as resistance. Press arm against pillow. Keep elbow straight. Hold 3 seconds and relax. Complete 3 times.  Copyright  VHI. All rights reserved.

## 2015-10-22 ENCOUNTER — Encounter (HOSPITAL_COMMUNITY): Payer: Self-pay

## 2015-10-22 ENCOUNTER — Encounter (HOSPITAL_COMMUNITY): Payer: TRICARE For Life (TFL)

## 2015-10-23 ENCOUNTER — Encounter (HOSPITAL_COMMUNITY): Payer: TRICARE For Life (TFL)

## 2015-10-24 ENCOUNTER — Ambulatory Visit (HOSPITAL_COMMUNITY): Payer: Medicare Other | Admitting: Occupational Therapy

## 2015-10-24 ENCOUNTER — Encounter (HOSPITAL_COMMUNITY): Payer: Self-pay | Admitting: Occupational Therapy

## 2015-10-24 DIAGNOSIS — R29898 Other symptoms and signs involving the musculoskeletal system: Secondary | ICD-10-CM | POA: Diagnosis not present

## 2015-10-24 DIAGNOSIS — M6281 Muscle weakness (generalized): Secondary | ICD-10-CM | POA: Diagnosis not present

## 2015-10-24 DIAGNOSIS — Z96611 Presence of right artificial shoulder joint: Secondary | ICD-10-CM | POA: Diagnosis not present

## 2015-10-24 DIAGNOSIS — M25511 Pain in right shoulder: Secondary | ICD-10-CM | POA: Diagnosis not present

## 2015-10-24 DIAGNOSIS — M25611 Stiffness of right shoulder, not elsewhere classified: Secondary | ICD-10-CM

## 2015-10-24 DIAGNOSIS — M629 Disorder of muscle, unspecified: Secondary | ICD-10-CM | POA: Diagnosis not present

## 2015-10-24 NOTE — Therapy (Signed)
[place], [place], [address] Phone: [phone]   Fax:  [phone]  Occupational Therapy Reassessment, Treatment, Recertification  Patient Details  Name: Scott Zamora MRN: [id number] Date of Birth: 03-27-1935 Referring Provider: Justice Britain, MD  Encounter Date: 10/24/2015      OT End of Session - 10/24/15 1532    Visit Number 11   Number of Visits 24   Date for OT Re-Evaluation 11/23/15   Authorization Type 1) Medicare 2) [facility] 3)[employer]   Authorization Time Period before 20th visit   Authorization - Visit Number 11   Authorization - Number of Visits 20   OT Start Time 1305   OT Stop Time 1347   OT Time Calculation (min) 42 min   Activity Tolerance Patient tolerated treatment well   Behavior During Therapy [facility] for tasks assessed/performed      Past Medical History  Diagnosis Date  . Hypertension   . Vertigo     hx of  . Sleep apnea     has CPAP but does not wear it  . Seasonal allergies   . Arthritis   . Dry skin   . Inguinal hernia, left   . GERD (gastroesophageal reflux disease)     Past Surgical History  Procedure Laterality Date  . Knee arthroscopy      left knee  . Colonoscopy w/ polypectomy    . Total shoulder arthroplasty  08/11/2012    Dr Justice Britain  . Total shoulder arthroplasty  08/11/2012    Procedure: TOTAL SHOULDER ARTHROPLASTY;  Surgeon: Marin Shutter, MD;  Location: [place];  Service: Orthopedics;  Laterality: Left;  . Inguinal hernia repair Left 04/18/2014    Procedure: HERNIA REPAIR INGUINAL ADULT;  Surgeon: Jamesetta So, MD;  Location: [facility];  Service: General;  Laterality: Left;  . Insertion of mesh Left 04/18/2014    Procedure: INSERTION OF MESH;  Surgeon: Jamesetta So, MD;  Location: [facility];  Service: General;  Laterality: Left;  . Hernia repair    . Eye surgery Bilateral   . Total shoulder arthroplasty Right 09/12/2015    Procedure: RIGHT TOTAL SHOULDER  ARTHROPLASTY;  Surgeon: Justice Britain, MD;  Location: [place];  Service: Orthopedics;  Laterality: Right;    There were no vitals filed for this visit.      Subjective Assessment - 10/24/15 1307    Subjective  S: I slept on a new pillow but I don't know if it's good for me or not because both my shoulders are uncomfortable.    Currently in Pain? Yes   Pain Score 3    Pain Location Shoulder   Pain Orientation Right   Pain Descriptors / Indicators Aching   Pain Type Acute pain   Pain Radiating Towards n/a   Pain Onset More than a month ago   Pain Frequency Intermittent   Aggravating Factors  moving it, sleeping on it    Pain Relieving Factors ice, pain medications   Effect of Pain on Daily Activities unable to use RUE for daily tasks   Multiple Pain Sites No           [facility] OT Assessment - 10/24/15 1323    Assessment   Diagnosis Right total shoulder arthroplasty   Precautions   Precautions Shoulder   Type of Shoulder Precautions See Protocol.    Shoulder Interventions Shoulder sling/immobilizer   Precaution Comments For comfort and sleep   Required Braces or Orthoses  Sling   Palpation   Palpation comment Max fascial restrictions in right upper arm ,trapezius, and scapularis region.    AROM   Overall AROM Comments Assessed supine, ER/IR adducted   AROM Assessment Site Shoulder   Right/Left Shoulder Right   Right Shoulder Flexion 114 Degrees   Right Shoulder ABduction 76 Degrees   Right Shoulder Internal Rotation 85 Degrees   Right Shoulder External Rotation 41 Degrees   PROM   Overall PROM Comments Assessed supine. IR/er adducted   PROM Assessment Site Shoulder   Right/Left Shoulder Right   Right Shoulder Flexion 121 Degrees  previous 80   Right Shoulder ABduction 109 Degrees  previous 40   Right Shoulder Internal Rotation 90 Degrees  same as previous   Right Shoulder External Rotation 55 Degrees  previous 15   Strength   Overall Strength Unable to assess;Due to  precautions   Overall Strength Comments Assessed seated, ER/IR adducted   Strength Assessment Site Shoulder   Right/Left Shoulder Right   Right Shoulder Flexion 3/5   Right Shoulder ABduction 3-/5   Right Shoulder Internal Rotation 3/5   Right Shoulder External Rotation 3/5                  OT Treatments/Exercises (OP) - 10/24/15 1308    Exercises   Exercises Shoulder   Shoulder Exercises: Supine   Protraction PROM;5 reps;AAROM;10 reps   Horizontal ABduction PROM;5 reps;AAROM;10 reps   External Rotation PROM;5 reps;AAROM;10 reps   Internal Rotation PROM;5 reps;AAROM;10 reps   Flexion PROM;5 reps;AAROM;10 reps   ABduction PROM;5 reps;AAROM;10 reps   ABduction Limitations OT unweighting arm   Shoulder Exercises: Standing   Protraction AAROM;10 reps   Horizontal ABduction AAROM;10 reps  1 rest break   External Rotation AAROM;10 reps   Internal Rotation AAROM;10 reps   Flexion AAROM;10 reps   ABduction AAROM;10 reps   Shoulder Exercises: Pulleys   Flexion 1 minute   ABduction 1 minute   Manual Therapy   Manual Therapy Myofascial release;Muscle Energy Technique   Manual therapy comments manual therapy completed prior to therapeutic exercise this date   Myofascial Release Myofascial release to right upper arm, trapezius, and scapularis regions to decrease pain and fascial restrictions and increase joint mobility   Muscle Energy Technique Muscle energry technique to right anterior deltoid to decrease muscle spasms and increase joint ROM.                  OT Short Term Goals - 10/24/15 1532    OT SHORT TERM GOAL #1   Title Patient will be educated and independent with HEP to increase functional use of RUE.   Time 4   Period Weeks   Status On-going   OT SHORT TERM GOAL #2   Title Patient will increase P/ROM of RUE to [facility] to increase ability to get shirts on and off.   Time 4   Period Weeks   Status On-going   OT SHORT TERM GOAL #3   Title Patient will  increase RUE strength to 3/5 to increase ability to complete activities below shoulder level.    Time 4   Period Weeks   Status Partially Met   OT SHORT TERM GOAL #4   Title Patient will decrease pain level to 5/10 when using RUE to increase ability to use RUE for 50% of daily tasks.    Time 4   Period Weeks   Status Achieved   OT SHORT TERM GOAL #5  Title Patient will decrease fascial restrictions to mod amount to increase functional mobility of RUE.    Time 4   Period Weeks   Status On-going           OT Long Term Goals - 10/01/15 1518    OT LONG TERM GOAL #1   Title Patient will return to highest level of independence using RUE for all daily and leisure tasks.    Time 8   Period Weeks   Status On-going   OT LONG TERM GOAL #2   Title Patient will increase RUE A/ROM to [facility] to increase ability to complete tasks above shoulder.    Time 8   Period Weeks   Status On-going   OT LONG TERM GOAL #3   Title Patient will increase RUE strength to 4+/5 to increase ability to return to golf and bowling activities.    Time 8   Period Weeks   Status On-going   OT LONG TERM GOAL #4   Title Patient will decrease pain level in RUE during daily tasks to 3/10 or less.   Time 8   Period Weeks   Status On-going   OT LONG TERM GOAL #5   Title Patient will decrease fascial restrictions to min amount to increase functional mobility needed to return to leisure tasks.    Time 8   Period Weeks   Status On-going               Plan - 10/24/15 1532    Clinical Impression Statement A: Reassessment completed this date. Pt has met 1/5 STGs,  partially met 1/5 STGs, and is slowly progressing towards remaining goals. Pt is now at week 6 post-op and is completing AA/ROM supine and standing in all ranges per protocol. Pt reports he is able to tell improvement as he catches himself trying to use the RUE for functional tasks during the day.    Rehab Potential Excellent   Clinical Impairments  Affecting Rehab Potential PMH significant for HTN, arthritis, GERD, inguinal hernia, vertigo, sleep apnea, L knee arthroscopy, and L TSA (2014)   OT Frequency 3x / week   OT Duration 8 weeks   OT Treatment/Interventions Self-care/ADL training;Ultrasound;DME and/or AE instruction;Scar mobilization;Iontophoresis;Passive range of motion;Patient/family education;Cryotherapy;Electrical Stimulation;Moist Heat;Therapeutic activities;Therapeutic exercises;Manual Therapy   Plan P: Continue skilled OT treatment following protocol for weeks 6-8 from MD. Continue with AA/ROM in all ranges supine & standing, add A/ROM flexion in supine. Add ER/IR with theraband, add scapular theraband, prone rowing & extension, sidelying ER as able to tolerate.    Consulted and Agree with Plan of Care Patient      Patient will benefit from skilled therapeutic intervention in order to improve the following deficits and impairments:  Decreased strength, Pain, Impaired UE functional use, Increased fascial restricitons, Decreased range of motion  Visit Diagnosis: Muscle weakness (generalized)  Pain in right shoulder  Stiffness of right shoulder joint    Problem List Patient Active Problem List   Diagnosis Date Noted  . S/P shoulder replacement 08/29/2012  . Pain in joint, shoulder region 08/29/2012  . Muscle weakness (generalized) 08/29/2012  . Arthritis of shoulder 08/12/2012    Guadelupe Sabin, OTR/L  [phone]  10/24/2015, 3:40 PM  [place], [place], [address] Phone: [phone]   Fax:  [phone]  Name: LATRELLE BAZAR MRN: [id number] Date of Birth: June 20, 1935

## 2015-10-25 ENCOUNTER — Encounter (HOSPITAL_COMMUNITY): Payer: Self-pay

## 2015-10-25 ENCOUNTER — Ambulatory Visit (HOSPITAL_COMMUNITY): Payer: Medicare Other

## 2015-10-25 DIAGNOSIS — M25611 Stiffness of right shoulder, not elsewhere classified: Secondary | ICD-10-CM | POA: Diagnosis not present

## 2015-10-25 DIAGNOSIS — M25511 Pain in right shoulder: Secondary | ICD-10-CM

## 2015-10-25 DIAGNOSIS — R29898 Other symptoms and signs involving the musculoskeletal system: Secondary | ICD-10-CM

## 2015-10-25 DIAGNOSIS — M629 Disorder of muscle, unspecified: Secondary | ICD-10-CM | POA: Diagnosis not present

## 2015-10-25 DIAGNOSIS — Z96611 Presence of right artificial shoulder joint: Secondary | ICD-10-CM | POA: Diagnosis not present

## 2015-10-25 DIAGNOSIS — M6281 Muscle weakness (generalized): Secondary | ICD-10-CM | POA: Diagnosis not present

## 2015-10-25 NOTE — Therapy (Signed)
[place], [place], [address] Phone: [phone]   Fax:  [phone]  Occupational Therapy Treatment  Patient Details  Name: Scott Zamora MRN: [id number] Date of Birth: 10/20/1934 Referring Provider: Justice Britain, MD  Encounter Date: 10/25/2015      OT End of Session - 10/25/15 1158    Visit Number 12   Number of Visits 24   Date for OT Re-Evaluation 11/23/15   Authorization Type 1) Medicare 2) [facility] 3)[employer]   Authorization Time Period before 20th visit   Authorization - Visit Number 12   Authorization - Number of Visits 20   OT Start Time 1115   OT Stop Time 1200   OT Time Calculation (min) 45 min   Activity Tolerance Patient tolerated treatment well   Behavior During Therapy [facility] for tasks assessed/performed      Past Medical History  Diagnosis Date  . Hypertension   . Vertigo     hx of  . Sleep apnea     has CPAP but does not wear it  . Seasonal allergies   . Arthritis   . Dry skin   . Inguinal hernia, left   . GERD (gastroesophageal reflux disease)     Past Surgical History  Procedure Laterality Date  . Knee arthroscopy      left knee  . Colonoscopy w/ polypectomy    . Total shoulder arthroplasty  08/11/2012    Dr Justice Britain  . Total shoulder arthroplasty  08/11/2012    Procedure: TOTAL SHOULDER ARTHROPLASTY;  Surgeon: Marin Shutter, MD;  Location: [place];  Service: Orthopedics;  Laterality: Left;  . Inguinal hernia repair Left 04/18/2014    Procedure: HERNIA REPAIR INGUINAL ADULT;  Surgeon: Jamesetta So, MD;  Location: [facility];  Service: General;  Laterality: Left;  . Insertion of mesh Left 04/18/2014    Procedure: INSERTION OF MESH;  Surgeon: Jamesetta So, MD;  Location: [facility];  Service: General;  Laterality: Left;  . Hernia repair    . Eye surgery Bilateral   . Total shoulder arthroplasty Right 09/12/2015    Procedure: RIGHT TOTAL SHOULDER ARTHROPLASTY;  Surgeon: Justice Britain, MD;   Location: [place];  Service: Orthopedics;  Laterality: Right;    There were no vitals filed for this visit.      Subjective Assessment - 10/25/15 1115    Subjective  S: When I woke up this morning my arm felt like a normal arm.   Currently in Pain? Yes   Pain Score 3    Pain Location Shoulder   Pain Orientation Right   Pain Descriptors / Indicators Aching   Pain Type Acute pain            [facility] OT Assessment - 10/24/15 1323    Assessment   Diagnosis Right total shoulder arthroplasty   Precautions   Precautions Shoulder   Type of Shoulder Precautions See Protocol.    Shoulder Interventions Shoulder sling/immobilizer   Precaution Comments For comfort and sleep   Required Braces or Orthoses Sling   Palpation   Palpation comment Max fascial restrictions in right upper arm ,trapezius, and scapularis region.    AROM   Overall AROM Comments Assessed supine, ER/IR adducted   AROM Assessment Site Shoulder   Right/Left Shoulder Right   Right Shoulder Flexion 114 Degrees   Right Shoulder ABduction 76 Degrees   Right Shoulder Internal Rotation 85 Degrees   Right Shoulder External Rotation 41  Degrees   PROM   Overall PROM Comments Assessed supine. IR/er adducted   PROM Assessment Site Shoulder   Right/Left Shoulder Right   Right Shoulder Flexion 121 Degrees  previous 80   Right Shoulder ABduction 109 Degrees  previous 40   Right Shoulder Internal Rotation 90 Degrees  same as previous   Right Shoulder External Rotation 55 Degrees  previous 15   Strength   Overall Strength Unable to assess;Due to precautions   Overall Strength Comments Assessed seated, ER/IR adducted   Strength Assessment Site Shoulder   Right/Left Shoulder Right   Right Shoulder Flexion 3/5   Right Shoulder ABduction 3-/5   Right Shoulder Internal Rotation 3/5   Right Shoulder External Rotation 3/5                  OT Treatments/Exercises (OP) - 10/25/15 1118    Exercises   Exercises  Shoulder   Shoulder Exercises: Supine   Protraction PROM;5 reps;AAROM;12 reps   Horizontal ABduction PROM;5 reps;AAROM;12 reps   External Rotation PROM;5 reps;AAROM;12 reps   Internal Rotation PROM;5 reps;AAROM;12 reps   Flexion PROM;5 reps;AAROM;12 reps;AROM;10 reps   ABduction PROM;5 reps;AAROM;12 reps   Shoulder Exercises: Prone   Extension AROM;10 reps   Other Prone Exercises Row 10X   Shoulder Exercises: Sidelying   External Rotation AROM;10 reps   Internal Rotation AROM;10 reps   Shoulder Exercises: Standing   Protraction AAROM;10 reps   Horizontal ABduction AAROM;10 reps   External Rotation AAROM;10 reps   Internal Rotation AAROM;10 reps   Flexion AAROM;5 reps   ABduction AAROM;10 reps   Manual Therapy   Manual Therapy Myofascial release   Manual therapy comments manual therapy completed prior to therapeutic exercise this date   Myofascial Release Myofascial release to right upper arm, trapezius, and scapularis regions to decrease pain and fascial restrictions and increase joint mobility                  OT Short Term Goals - 10/24/15 1532    OT SHORT TERM GOAL #1   Title Patient will be educated and independent with HEP to increase functional use of RUE.   Time 4   Period Weeks   Status On-going   OT SHORT TERM GOAL #2   Title Patient will increase P/ROM of RUE to [facility] to increase ability to get shirts on and off.   Time 4   Period Weeks   Status On-going   OT SHORT TERM GOAL #3   Title Patient will increase RUE strength to 3/5 to increase ability to complete activities below shoulder level.    Time 4   Period Weeks   Status Partially Met   OT SHORT TERM GOAL #4   Title Patient will decrease pain level to 5/10 when using RUE to increase ability to use RUE for 50% of daily tasks.    Time 4   Period Weeks   Status Achieved   OT SHORT TERM GOAL #5   Title Patient will decrease fascial restrictions to mod amount to increase functional mobility of RUE.     Time 4   Period Weeks   Status On-going           OT Long Term Goals - 10/01/15 1518    OT LONG TERM GOAL #1   Title Patient will return to highest level of independence using RUE for all daily and leisure tasks.    Time 8   Period Weeks   Status  On-going   OT LONG TERM GOAL #2   Title Patient will increase RUE A/ROM to [facility] to increase ability to complete tasks above shoulder.    Time 8   Period Weeks   Status On-going   OT LONG TERM GOAL #3   Title Patient will increase RUE strength to 4+/5 to increase ability to return to golf and bowling activities.    Time 8   Period Weeks   Status On-going   OT LONG TERM GOAL #4   Title Patient will decrease pain level in RUE during daily tasks to 3/10 or less.   Time 8   Period Weeks   Status On-going   OT LONG TERM GOAL #5   Title Patient will decrease fascial restrictions to min amount to increase functional mobility needed to return to leisure tasks.    Time 8   Period Weeks   Status On-going               Plan - 10/25/15 1158    Clinical Impression Statement A; Increased reps with AA/ROM exercises in supine. Added A/ROM flexion in supine, external rotation in sidelying, and prone extension and row. Patient experienced increased difficulty with AA/ROM exercises in standing this session.   Plan P: Add internal and external rotation exercises as well as scapular exercises with Theraband.      Patient will benefit from skilled therapeutic intervention in order to improve the following deficits and impairments:  Decreased strength, Pain, Impaired UE functional use, Increased fascial restricitons, Decreased range of motion  Visit Diagnosis: Pain in right shoulder  Stiffness of right shoulder joint  Other symptoms and signs involving the musculoskeletal system    Problem List Patient Active Problem List   Diagnosis Date Noted  . S/P shoulder replacement 08/29/2012  . Pain in joint, shoulder region 08/29/2012  .  Muscle weakness (generalized) 08/29/2012  . Arthritis of shoulder 08/12/2012    Ailene Ravel, OTR/L,CBIS  [phone]  10/25/2015, 12:01 PM  [place] [address] [place], [place], [address] Phone: [phone]   Fax:  [phone]  Name: Scott Zamora MRN: [id number] Date of Birth: December 23, 1934

## 2015-10-28 DIAGNOSIS — Z96611 Presence of right artificial shoulder joint: Secondary | ICD-10-CM | POA: Diagnosis not present

## 2015-10-28 DIAGNOSIS — Z471 Aftercare following joint replacement surgery: Secondary | ICD-10-CM | POA: Diagnosis not present

## 2015-10-29 ENCOUNTER — Encounter (HOSPITAL_COMMUNITY): Payer: Self-pay | Admitting: Occupational Therapy

## 2015-10-29 ENCOUNTER — Ambulatory Visit (HOSPITAL_COMMUNITY): Payer: Medicare Other | Admitting: Occupational Therapy

## 2015-10-29 DIAGNOSIS — M6281 Muscle weakness (generalized): Secondary | ICD-10-CM | POA: Diagnosis not present

## 2015-10-29 DIAGNOSIS — Z96611 Presence of right artificial shoulder joint: Secondary | ICD-10-CM | POA: Diagnosis not present

## 2015-10-29 DIAGNOSIS — M25511 Pain in right shoulder: Secondary | ICD-10-CM | POA: Diagnosis not present

## 2015-10-29 DIAGNOSIS — M25611 Stiffness of right shoulder, not elsewhere classified: Secondary | ICD-10-CM | POA: Diagnosis not present

## 2015-10-29 DIAGNOSIS — R29898 Other symptoms and signs involving the musculoskeletal system: Secondary | ICD-10-CM | POA: Diagnosis not present

## 2015-10-29 DIAGNOSIS — M629 Disorder of muscle, unspecified: Secondary | ICD-10-CM | POA: Diagnosis not present

## 2015-10-29 NOTE — Therapy (Signed)
[place], [place], [address] Phone: [phone]   Fax:  [phone]  Occupational Therapy Treatment  Patient Details  Name: Scott Zamora MRN: [id number] Date of Birth: 04-Sep-1934 Referring Provider: Justice Britain, MD  Encounter Date: 10/29/2015      OT End of Session - 10/29/15 1451    Visit Number 13   Number of Visits 24   Date for OT Re-Evaluation 11/23/15   Authorization Type 1) Medicare 2) [facility] 3)[employer]   Authorization Time Period before 20th visit   Authorization - Visit Number 59   Authorization - Number of Visits 20   OT Start Time 1346   OT Stop Time 1430   OT Time Calculation (min) 44 min   Activity Tolerance Patient tolerated treatment well   Behavior During Therapy [facility] for tasks assessed/performed      Past Medical History  Diagnosis Date  . Hypertension   . Vertigo     hx of  . Sleep apnea     has CPAP but does not wear it  . Seasonal allergies   . Arthritis   . Dry skin   . Inguinal hernia, left   . GERD (gastroesophageal reflux disease)     Past Surgical History  Procedure Laterality Date  . Knee arthroscopy      left knee  . Colonoscopy w/ polypectomy    . Total shoulder arthroplasty  08/11/2012    Dr Justice Britain  . Total shoulder arthroplasty  08/11/2012    Procedure: TOTAL SHOULDER ARTHROPLASTY;  Surgeon: Marin Shutter, MD;  Location: [place];  Service: Orthopedics;  Laterality: Left;  . Inguinal hernia repair Left 04/18/2014    Procedure: HERNIA REPAIR INGUINAL ADULT;  Surgeon: Jamesetta So, MD;  [facility];  Service: General;  Laterality: Left;  . Insertion of mesh Left 04/18/2014    Procedure: INSERTION OF MESH;  Surgeon: Jamesetta So, MD;  Location: [facility];  Service: General;  Laterality: Left;  . Hernia repair    . Eye surgery Bilateral   . Total shoulder arthroplasty Right 09/12/2015    Procedure: RIGHT TOTAL SHOULDER ARTHROPLASTY;  Surgeon: Justice Britain, MD;   Location: [place];  Service: Orthopedics;  Laterality: Right;    There were no vitals filed for this visit.      Subjective Assessment - 10/29/15 1346    Subjective  S: The doctor told me I needed to rough up this arm and use it.    Currently in Pain? No/denies            [facility] OT Assessment - 10/29/15 1345    Assessment   Diagnosis Right total shoulder arthroplasty   Precautions   Precautions Shoulder   Type of Shoulder Precautions See Protocol.    Shoulder Interventions Shoulder sling/immobilizer   Precaution Comments For comfort and sleep   Required Braces or Orthoses Sling                  OT Treatments/Exercises (OP) - 10/29/15 1348    Exercises   Exercises Shoulder   Shoulder Exercises: Supine   Protraction PROM;5 reps;AAROM;12 reps   Horizontal ABduction PROM;5 reps;AAROM;12 reps   External Rotation PROM;5 reps;AAROM;12 reps   Internal Rotation PROM;5 reps;AAROM;12 reps   Flexion PROM;5 reps;AAROM;12 reps;AROM;10 reps   ABduction PROM;5 reps;AAROM;12 reps   Shoulder Exercises: Standing   Protraction AAROM;12 reps   Horizontal ABduction AAROM;10 reps   External Rotation AAROM;12 reps;Theraband;10  reps   Theraband Level (Shoulder External Rotation) Level 2 (Red)   Internal Rotation AAROM;12 reps;Theraband;10 reps   Theraband Level (Shoulder Internal Rotation) Level 2 (Red)   Flexion AAROM;10 reps   ABduction AAROM;12 reps   Extension Theraband;10 reps   Theraband Level (Shoulder Extension) Level 2 (Red)   Row Theraband;10 reps   Theraband Level (Shoulder Row) Level 2 (Red)   Shoulder Exercises: Pulleys   Flexion 1 minute   ABduction 1 minute   Shoulder Exercises: ROM/Strengthening   Rhythmic Stabilization, Seated 25X at 60 degrees, 90 degrees, mod difficulty   Manual Therapy   Manual Therapy Myofascial release   Manual therapy comments manual therapy completed prior to therapeutic exercise this date   Myofascial Release Myofascial release to  right upper arm, trapezius, and scapularis regions to decrease pain and fascial restrictions and increase joint mobility   Muscle Energy Technique Muscle energry technique to right anterior deltoid to decrease muscle spasms and increase joint ROM.                   OT Short Term Goals - 10/24/15 1532    OT SHORT TERM GOAL #1   Title Patient will be educated and independent with HEP to increase functional use of RUE.   Time 4   Period Weeks   Status On-going   OT SHORT TERM GOAL #2   Title Patient will increase P/ROM of RUE to [employer] to increase ability to get shirts on and off.   Time 4   Period Weeks   Status On-going   OT SHORT TERM GOAL #3   Title Patient will increase RUE strength to 3/5 to increase ability to complete activities below shoulder level.    Time 4   Period Weeks   Status Partially Met   OT SHORT TERM GOAL #4   Title Patient will decrease pain level to 5/10 when using RUE to increase ability to use RUE for 50% of daily tasks.    Time 4   Period Weeks   Status Achieved   OT SHORT TERM GOAL #5   Title Patient will decrease fascial restrictions to mod amount to increase functional mobility of RUE.    Time 4   Period Weeks   Status On-going           OT Long Term Goals - 10/01/15 1518    OT LONG TERM GOAL #1   Title Patient will return to highest level of independence using RUE for all daily and leisure tasks.    Time 8   Period Weeks   Status On-going   OT LONG TERM GOAL #2   Title Patient will increase RUE A/ROM to [facility] to increase ability to complete tasks above shoulder.    Time 8   Period Weeks   Status On-going   OT LONG TERM GOAL #3   Title Patient will increase RUE strength to 4+/5 to increase ability to return to golf and bowling activities.    Time 8   Period Weeks   Status On-going   OT LONG TERM GOAL #4   Title Patient will decrease pain level in RUE during daily tasks to 3/10 or less.   Time 8   Period Weeks   Status On-going    OT LONG TERM GOAL #5   Title Patient will decrease fascial restrictions to min amount to increase functional mobility needed to return to leisure tasks.    Time 8   Period Weeks  Status On-going               Plan - 10/29/15 1452    Clinical Impression Statement A: Added extension, row, ER/IR theraband exercises this session. Pt experiencing fatigue during session, rest breaks provided as needed. Pt with improved form during supine and seated exercises, intermittent verbal cuing.    Rehab Potential Excellent   Clinical Impairments Affecting Rehab Potential PMH significant for HTN, arthritis, GERD, inguinal hernia, vertigo, sleep apnea, L knee arthroscopy, and L TSA (2014)   OT Frequency 3x / week   OT Duration 8 weeks   OT Treatment/Interventions Self-care/ADL training;Ultrasound;DME and/or AE instruction;Scar mobilization;Iontophoresis;Passive range of motion;Patient/family education;Cryotherapy;Electrical Stimulation;Moist Heat;Therapeutic activities;Therapeutic exercises;Manual Therapy   Plan P: Attempt to increase AA/ROM exercises to 12 in standing, resume missed exercises in prone. Cont with theraband exercises, add retraction when appropriate & pt able to tolerate   Consulted and Agree with Plan of Care Patient      Patient will benefit from skilled therapeutic intervention in order to improve the following deficits and impairments:  Decreased strength, Pain, Impaired UE functional use, Increased fascial restricitons, Decreased range of motion  Visit Diagnosis: Pain in right shoulder  Stiffness of right shoulder joint  Muscle weakness (generalized)    Problem List Patient Active Problem List   Diagnosis Date Noted  . S/P shoulder replacement 08/29/2012  . Pain in joint, shoulder region 08/29/2012  . Muscle weakness (generalized) 08/29/2012  . Arthritis of shoulder 08/12/2012    Guadelupe Sabin, OTR/L  [phone]  10/29/2015, 2:56 PM  [place], [place], [address] Phone: [phone]   Fax:  [phone]  Name: COSTAS SENA MRN: [id number] Date of Birth: [birth year]

## 2015-10-31 ENCOUNTER — Ambulatory Visit (HOSPITAL_COMMUNITY): Payer: Medicare Other

## 2015-10-31 ENCOUNTER — Encounter (HOSPITAL_COMMUNITY): Payer: Self-pay

## 2015-10-31 DIAGNOSIS — R29898 Other symptoms and signs involving the musculoskeletal system: Secondary | ICD-10-CM

## 2015-10-31 DIAGNOSIS — Z96611 Presence of right artificial shoulder joint: Secondary | ICD-10-CM | POA: Diagnosis not present

## 2015-10-31 DIAGNOSIS — M25511 Pain in right shoulder: Secondary | ICD-10-CM

## 2015-10-31 DIAGNOSIS — M6281 Muscle weakness (generalized): Secondary | ICD-10-CM | POA: Diagnosis not present

## 2015-10-31 DIAGNOSIS — M629 Disorder of muscle, unspecified: Secondary | ICD-10-CM | POA: Diagnosis not present

## 2015-10-31 DIAGNOSIS — M25611 Stiffness of right shoulder, not elsewhere classified: Secondary | ICD-10-CM | POA: Diagnosis not present

## 2015-10-31 NOTE — Patient Instructions (Signed)
1) Shoulder Protraction    Begin with elbows by your side, slowly "punch" straight out in front of you keeping arms/elbows straight.      2) Shoulder Flexion  Supine:     Standing:         Begin with arms at your side with thumbs pointed up, slowly raise both arms up and forward towards overhead.         3) Horizontal abduction/adduction  Supine:   Standing:           Begin with arms straight out in front of you, bring out to the side in at "T" shape. Keep arms straight entire time.         4) Internal & External Rotation    *No band* -Stand with elbows at the side and elbows bent 90 degrees. Move your forearms away from your body, then bring back inward toward the body.     5) Shoulder Abduction  Supine:     Standing:       Lying on your back begin with your arms flat on the table next to your side. Slowly move your arms out to the side so that they go overhead, in a jumping jack or snow angel movement.      Repeat all exercises 10-15 times, 1-2 times per day.  

## 2015-10-31 NOTE — Therapy (Signed)
[place], [place], [address] Phone: [phone]   Fax:  [phone]  Occupational Therapy Treatment  Patient Details  Name: Scott Zamora MRN: [id number] Date of Birth: [birth year] Referring Provider: Justice Britain, MD  Encounter Date: 10/31/2015      OT End of Session - 10/31/15 1424    Visit Number 14   Number of Visits 24   Date for OT Re-Evaluation 11/23/15   Authorization Type 1) Medicare 2) [facility] 3)[employer]   Authorization Time Period before 20th visit   Authorization - Visit Number 52   Authorization - Number of Visits 20   OT Start Time 1340   OT Stop Time 1420   OT Time Calculation (min) 40 min   Activity Tolerance Patient tolerated treatment well   Behavior During Therapy [facility] for tasks assessed/performed      Past Medical History  Diagnosis Date  . Hypertension   . Vertigo     hx of  . Sleep apnea     has CPAP but does not wear it  . Seasonal allergies   . Arthritis   . Dry skin   . Inguinal hernia, left   . GERD (gastroesophageal reflux disease)     Past Surgical History  Procedure Laterality Date  . Knee arthroscopy      left knee  . Colonoscopy w/ polypectomy    . Total shoulder arthroplasty  08/11/2012    Dr Justice Britain  . Total shoulder arthroplasty  08/11/2012    Procedure: TOTAL SHOULDER ARTHROPLASTY;  Surgeon: Marin Shutter, MD;  Location: [place];  Service: Orthopedics;  Laterality: Left;  . Inguinal hernia repair Left 04/18/2014    Procedure: HERNIA REPAIR INGUINAL ADULT;  Surgeon: Jamesetta So, MD;  Location: [facility];  Service: General;  Laterality: Left;  . Insertion of mesh Left 04/18/2014    Procedure: INSERTION OF MESH;  Surgeon: Jamesetta So, MD;  Location[facility];  Service: General;  Laterality: Left;  . Hernia repair    . Eye surgery Bilateral   . Total shoulder arthroplasty Right 09/12/2015    Procedure: RIGHT TOTAL SHOULDER ARTHROPLASTY;  Surgeon: Justice Britain, MD;   Location: [place];  Service: Orthopedics;  Laterality: Right;    There were no vitals filed for this visit.      Subjective Assessment - 10/31/15 1345    Subjective  S: I went to catch something that was falling and I really hurt it.   Currently in Pain? Yes   Pain Score 5    Pain Location Shoulder   Pain Orientation Right   Pain Descriptors / Indicators Aching   Pain Type Acute pain   Pain Onset Today   Pain Frequency Occasional   Aggravating Factors  Moving it   Pain Relieving Factors ice, pain medication, rest   Effect of Pain on Daily Activities Unable to use RUE for daily tasks.   Multiple Pain Sites No            [facility] OT Assessment - 10/31/15 0001    Assessment   Diagnosis Right total shoulder arthroplasty   Precautions   Precautions Shoulder   Type of Shoulder Precautions See Protocol.    Shoulder Interventions Shoulder sling/immobilizer   Precaution Comments For comfort and sleep   Required Braces or Orthoses Sling                  OT Treatments/Exercises (OP) - 10/31/15 1404  Exercises   Exercises Shoulder   Shoulder Exercises: Supine   Protraction PROM;5 reps;AROM;10 reps   Horizontal ABduction PROM;5 reps;AROM;10 reps   External Rotation PROM;5 reps;AROM;10 reps   Internal Rotation PROM;5 reps;AROM;10 reps   Flexion PROM;5 reps;AROM;10 reps   ABduction PROM;5 reps;AROM;10 reps   Shoulder Exercises: Standing   Protraction AAROM;12 reps   Horizontal ABduction AAROM;12 reps   External Rotation AAROM;12 reps   Internal Rotation AAROM;12 reps   Flexion AAROM;12 reps   ABduction AAROM;12 reps   Extension Theraband;12 reps   Theraband Level (Shoulder Extension) Level 2 (Red)   Row Theraband;12 reps   Theraband Level (Shoulder Row) Level 2 (Red)   Shoulder Exercises: Pulleys   Flexion 1 minute   ABduction 1 minute   Shoulder Exercises: ROM/Strengthening   Proximal Shoulder Strengthening, Supine 10X   Manual Therapy   Manual Therapy  Myofascial release   Manual therapy comments manual therapy completed prior to therapeutic exercise this date   Myofascial Release Myofascial release to right upper arm, trapezius, and scapularis regions to decrease pain and fascial restrictions and increase joint mobility                OT Education - 10/31/15 1409    Education provided Yes   Education Details A/ROM HEP provided. Exercises demonstrated and patient returned in supine.   Person(s) Educated Patient   Methods Explanation;Demonstration;Tactile cues;Verbal cues;Handout   Comprehension Verbalized understanding;Returned demonstration;Verbal cues required;Tactile cues required          OT Short Term Goals - 10/24/15 1532    OT SHORT TERM GOAL #1   Title Patient will be educated and independent with HEP to increase functional use of RUE.   Time 4   Period Weeks   Status On-going   OT SHORT TERM GOAL #2   Title Patient will increase P/ROM of RUE to WFL to increase ability to get shirts on and off.   Time 4   Period Weeks   Status On-going   OT SHORT TERM GOAL #3   Title Patient will increase RUE strength to 3/5 to increase ability to complete activities below shoulder level.    Time 4   Period Weeks   Status Partially Met   OT SHORT TERM GOAL #4   Title Patient will decrease pain level to 5/10 when using RUE to increase ability to use RUE for 50% of daily tasks.    Time 4   Period Weeks   Status Achieved   OT SHORT TERM GOAL #5   Title Patient will decrease fascial restrictions to mod amount to increase functional mobility of RUE.    Time 4   Period Weeks   Status On-going           OT Long Term Goals - 10/01/15 1518    OT LONG TERM GOAL #1   Title Patient will return to highest level of independence using RUE for all daily and leisure tasks.    Time 8   Period Weeks   Status On-going   OT LONG TERM GOAL #2   Title Patient will increase RUE A/ROM to WFL to increase ability to complete tasks above  shoulder.    Time 8   Period Weeks   Status On-going   OT LONG TERM GOAL #3   Title Patient will increase RUE strength to 4+/5 to increase ability to return to golf and bowling activities.    Time 8   Period Weeks   Status   On-going   OT LONG TERM GOAL #4   Title Patient will decrease pain level in RUE during daily tasks to 3/10 or less.   Time 8   Period Weeks   Status On-going   OT LONG TERM GOAL #5   Title Patient will decrease fascial restrictions to min amount to increase functional mobility needed to return to leisure tasks.    Time 8   Period Weeks   Status On-going               Plan - 10/31/15 1424    Clinical Impression Statement A: Continued with exercises added last session. Added A/ROM and proximal shoulder strengthning exercises in supine and provided HEP. Pt Presents with increased pain this session from catching something that was falling. Therapist notes decreasd fascial restrictions this session.   Plan P: Continue with A/ROM exercises in supine as well as Theraband. Add A/ROM standing and retraction with Theraband if tolerated.      Patient will benefit from skilled therapeutic intervention in order to improve the following deficits and impairments:  Decreased strength, Pain, Impaired UE functional use, Increased fascial restricitons, Decreased range of motion  Visit Diagnosis: Pain in right shoulder  Stiffness of right shoulder joint  Other symptoms and signs involving the musculoskeletal system    Problem List Patient Active Problem List   Diagnosis Date Noted  . S/P shoulder replacement 08/29/2012  . Pain in joint, shoulder region 08/29/2012  . Muscle weakness (generalized) 08/29/2012  . Arthritis of shoulder 08/12/2012    Ailene Ravel, OTR/L,CBIS  [phone]  10/31/2015, 2:30 PM  [place], [place], [address] Phone: [phone]   Fax:  [phone]  Name: BLAYKE CORDREY MRN: [id number] Date of Birth: 09-Jul-1935

## 2015-11-07 ENCOUNTER — Encounter (HOSPITAL_COMMUNITY): Payer: Self-pay | Admitting: Occupational Therapy

## 2015-11-07 ENCOUNTER — Ambulatory Visit (HOSPITAL_COMMUNITY): Payer: Medicare Other | Admitting: Occupational Therapy

## 2015-11-07 DIAGNOSIS — M6281 Muscle weakness (generalized): Secondary | ICD-10-CM

## 2015-11-07 DIAGNOSIS — M25511 Pain in right shoulder: Secondary | ICD-10-CM

## 2015-11-07 DIAGNOSIS — Z96611 Presence of right artificial shoulder joint: Secondary | ICD-10-CM | POA: Diagnosis not present

## 2015-11-07 DIAGNOSIS — R29898 Other symptoms and signs involving the musculoskeletal system: Secondary | ICD-10-CM | POA: Diagnosis not present

## 2015-11-07 DIAGNOSIS — M25611 Stiffness of right shoulder, not elsewhere classified: Secondary | ICD-10-CM

## 2015-11-07 DIAGNOSIS — M629 Disorder of muscle, unspecified: Secondary | ICD-10-CM | POA: Diagnosis not present

## 2015-11-07 NOTE — Therapy (Signed)
[place], [place], [address] Phone: [phone]   Fax:  [phone]  Occupational Therapy Treatment  Patient Details  Name: Scott Zamora MRN: [id number] Date of Birth: Mar 31, 1935 Referring Provider: Justice Britain, MD  Encounter Date: 11/07/2015      OT End of Session - 11/07/15 1300    Visit Number 15   Number of Visits 24   Date for OT Re-Evaluation 11/23/15   Authorization Type 1) Medicare 2) [facility] 3)[employer]   Authorization Time Period before 20th visit   Authorization - Visit Number 15   Authorization - Number of Visits 20   OT Start Time 1124   OT Stop Time 1207   OT Time Calculation (min) 43 min   Activity Tolerance Patient tolerated treatment well   Behavior During Therapy [facility] for tasks assessed/performed      Past Medical History  Diagnosis Date  . Hypertension   . Vertigo     hx of  . Sleep apnea     has CPAP but does not wear it  . Seasonal allergies   . Arthritis   . Dry skin   . Inguinal hernia, left   . GERD (gastroesophageal reflux disease)     Past Surgical History  Procedure Laterality Date  . Knee arthroscopy      left knee  . Colonoscopy w/ polypectomy    . Total shoulder arthroplasty  08/11/2012    Dr Justice Britain  . Total shoulder arthroplasty  08/11/2012    Procedure: TOTAL SHOULDER ARTHROPLASTY;  Surgeon: Marin Shutter, MD;  Location: [place];  Service: Orthopedics;  Laterality: Left;  . Inguinal hernia repair Left 04/18/2014    Procedure: HERNIA REPAIR INGUINAL ADULT;  Surgeon: Jamesetta So, MD;  [facility];  Service: General;  Laterality: Left;  . Insertion of mesh Left 04/18/2014    Procedure: INSERTION OF MESH;  Surgeon: Jamesetta So, MD;  Location: [facility];  Service: General;  Laterality: Left;  . Hernia repair    . Eye surgery Bilateral   . Total shoulder arthroplasty Right 09/12/2015    Procedure: RIGHT TOTAL SHOULDER ARTHROPLASTY;  Surgeon: Justice Britain, MD;   Location: [place];  Service: Orthopedics;  Laterality: Right;    There were no vitals filed for this visit.      Subjective Assessment - 11/07/15 1142    Subjective  S: I haven't been here since last Thursday.    Currently in Pain? No/denies            [facility] OT Assessment - 11/07/15 1127    Assessment   Diagnosis Right total shoulder arthroplasty   Precautions   Precautions Shoulder   Type of Shoulder Precautions See Protocol.    Shoulder Interventions Shoulder sling/immobilizer   Precaution Comments For comfort and sleep   Required Braces or Orthoses Sling                  OT Treatments/Exercises (OP) - 11/07/15 1142    Exercises   Exercises Shoulder   Shoulder Exercises: Supine   Protraction PROM;5 reps;AROM;10 reps   Horizontal ABduction PROM;5 reps;AROM;10 reps   External Rotation PROM;5 reps;AROM;10 reps   Internal Rotation PROM;5 reps;AROM;10 reps   Flexion PROM;5 reps;AROM;10 reps   ABduction PROM;5 reps;AROM;10 reps   Shoulder Exercises: Standing   Protraction AROM;10 reps   Horizontal ABduction AROM;10 reps   External Rotation AROM;10 reps   Internal Rotation AROM;10 reps  Flexion AROM;10 reps   ABduction AAROM;12 reps   Extension Theraband;12 reps   Theraband Level (Shoulder Extension) Level 2 (Red)   Row Theraband;12 reps   Theraband Level (Shoulder Row) Level 2 (Red)   Shoulder Exercises: Pulleys   Flexion 1 minute   ABduction 1 minute   Shoulder Exercises: ROM/Strengthening   Proximal Shoulder Strengthening, Supine 10X   Manual Therapy   Manual Therapy Myofascial release   Manual therapy comments manual therapy completed prior to therapeutic exercise this date   Myofascial Release Myofascial release to right upper arm, trapezius, and scapularis regions to decrease pain and fascial restrictions and increase joint mobility                OT Education - 11/07/15 1157    Education provided Yes          OT Short Term Goals -  10/24/15 1532    OT SHORT TERM GOAL #1   Title Patient will be educated and independent with HEP to increase functional use of RUE.   Time 4   Period Weeks   Status On-going   OT SHORT TERM GOAL #2   Title Patient will increase P/ROM of RUE to [name] to increase ability to get shirts on and off.   Time 4   Period Weeks   Status On-going   OT SHORT TERM GOAL #3   Title Patient will increase RUE strength to 3/5 to increase ability to complete activities below shoulder level.    Time 4   Period Weeks   Status Partially Met   OT SHORT TERM GOAL #4   Title Patient will decrease pain level to 5/10 when using RUE to increase ability to use RUE for 50% of daily tasks.    Time 4   Period Weeks   Status Achieved   OT SHORT TERM GOAL #5   Title Patient will decrease fascial restrictions to mod amount to increase functional mobility of RUE.    Time 4   Period Weeks   Status On-going           OT Long Term Goals - 10/01/15 1518    OT LONG TERM GOAL #1   Title Patient will return to highest level of independence using RUE for all daily and leisure tasks.    Time 8   Period Weeks   Status On-going   OT LONG TERM GOAL #2   Title Patient will increase RUE A/ROM to [facility] to increase ability to complete tasks above shoulder.    Time 8   Period Weeks   Status On-going   OT LONG TERM GOAL #3   Title Patient will increase RUE strength to 4+/5 to increase ability to return to golf and bowling activities.    Time 8   Period Weeks   Status On-going   OT LONG TERM GOAL #4   Title Patient will decrease pain level in RUE during daily tasks to 3/10 or less.   Time 8   Period Weeks   Status On-going   OT LONG TERM GOAL #5   Title Patient will decrease fascial restrictions to min amount to increase functional mobility needed to return to leisure tasks.    Time 8   Period Weeks   Status On-going               Plan - 11/07/15 1300    Clinical Impression Statement A: Added A/ROM in  standing with the exception of abduction. Pt required  intermittent rest breaks due to fatigue, reports he hasn't been feeling well with allergies this week. Did not add theraband retraction due to pt inability to tolerate. Pt required verbal cuing for form during A/ROM exercises.    Rehab Potential Excellent   Clinical Impairments Affecting Rehab Potential PMH significant for HTN, arthritis, GERD, inguinal hernia, vertigo, sleep apnea, L knee arthroscopy, and L TSA (2014)   OT Frequency 3x / week   OT Duration 8 weeks   OT Treatment/Interventions Self-care/ADL training;Ultrasound;DME and/or AE instruction;Scar mobilization;Iontophoresis;Passive range of motion;Patient/family education;Cryotherapy;Electrical Stimulation;Moist Heat;Therapeutic activities;Therapeutic exercises;Manual Therapy   Plan P: Continue working towards independence with theraband exercises, add retraction when appropriate.       Patient will benefit from skilled therapeutic intervention in order to improve the following deficits and impairments:  Decreased strength, Pain, Impaired UE functional use, Increased fascial restricitons, Decreased range of motion  Visit Diagnosis: Pain in right shoulder  Stiffness of right shoulder joint  Other symptoms and signs involving the musculoskeletal system  Muscle weakness (generalized)    Problem List Patient Active Problem List   Diagnosis Date Noted  . S/P shoulder replacement 08/29/2012  . Pain in joint, shoulder region 08/29/2012  . Muscle weakness (generalized) 08/29/2012  . Arthritis of shoulder 08/12/2012    Guadelupe Sabin, OTR/L  [phone]  11/07/2015, 1:06 PM  [place], [place], [address] Phone: [phone]   Fax:  [phone]  Name: RAWLIN REAUME MRN: [id number] Date of Birth: 1935/07/05

## 2015-11-07 NOTE — Patient Instructions (Signed)
1) Shoulder Protraction    Begin with elbows by your side, slowly "punch" straight out in front of you keeping arms/elbows straight.      2) Shoulder Flexion  Supine:     Standing:         Begin with arms at your side with thumbs pointed up, slowly raise both arms up and forward towards overhead.         3) Horizontal abduction/adduction  Supine:   Standing:           Begin with arms straight out in front of you, bring out to the side in at "T" shape. Keep arms straight entire time.         4) Internal & External Rotation    *No band* -Stand with elbows at the side and elbows bent 90 degrees. Move your forearms away from your body, then bring back inward toward the body.     5) Shoulder Abduction  Supine:     Standing:       Lying on your back begin with your arms flat on the table next to your side. Slowly move your arms out to the side so that they go overhead, in a jumping jack or snow angel movement.      Repeat all exercises 10-15 times, 1-2 times per day.  

## 2015-11-12 ENCOUNTER — Ambulatory Visit (HOSPITAL_COMMUNITY): Payer: Medicare Other | Attending: Orthopedic Surgery

## 2015-11-12 ENCOUNTER — Encounter (HOSPITAL_COMMUNITY): Payer: Self-pay

## 2015-11-12 DIAGNOSIS — R29898 Other symptoms and signs involving the musculoskeletal system: Secondary | ICD-10-CM | POA: Diagnosis not present

## 2015-11-12 DIAGNOSIS — M25611 Stiffness of right shoulder, not elsewhere classified: Secondary | ICD-10-CM | POA: Diagnosis not present

## 2015-11-12 DIAGNOSIS — M25511 Pain in right shoulder: Secondary | ICD-10-CM | POA: Diagnosis not present

## 2015-11-12 NOTE — Therapy (Signed)
[place], [place], [address] Phone: [phone]   Fax:  [phone]  Occupational Therapy Treatment  Patient Details  Name: Scott Zamora MRN: [id number] Date of Birth: 30-Dec-1934 Referring Provider: Justice Britain, MD  Encounter Date: 11/12/2015      OT End of Session - 11/12/15 1157    Visit Number 16   Number of Visits 24   Date for OT Re-Evaluation 11/23/15   Authorization Type 1) Medicare 2) [facility] 3)[employer]   Authorization Time Period before 20th visit   Authorization - Visit Number 78   Authorization - Number of Visits 20   OT Start Time 1035   OT Stop Time 1115   OT Time Calculation (min) 40 min   Activity Tolerance Patient tolerated treatment well   Behavior During Therapy [facility] for tasks assessed/performed      Past Medical History  Diagnosis Date  . Hypertension   . Vertigo     hx of  . Sleep apnea     has CPAP but does not wear it  . Seasonal allergies   . Arthritis   . Dry skin   . Inguinal hernia, left   . GERD (gastroesophageal reflux disease)     Past Surgical History  Procedure Laterality Date  . Knee arthroscopy      left knee  . Colonoscopy w/ polypectomy    . Total shoulder arthroplasty  08/11/2012    Dr Justice Britain  . Total shoulder arthroplasty  08/11/2012    Procedure: TOTAL SHOULDER ARTHROPLASTY;  Surgeon: Marin Shutter, MD;  Location: [place];  Service: Orthopedics;  Laterality: Left;  . Inguinal hernia repair Left 04/18/2014    Procedure: HERNIA REPAIR INGUINAL ADULT;  Surgeon: Jamesetta So, MD;  Location: [facility];  Service: General;  Laterality: Left;  . Insertion of mesh Left 04/18/2014    Procedure: INSERTION OF MESH;  Surgeon: Jamesetta So, MD;  Location: [facility];  Service: General;  Laterality: Left;  . Hernia repair    . Eye surgery Bilateral   . Total shoulder arthroplasty Right 09/12/2015    Procedure: RIGHT TOTAL SHOULDER ARTHROPLASTY;  Surgeon: Justice Britain, MD;   Location: [place];  Service: Orthopedics;  Laterality: Right;    There were no vitals filed for this visit.      Subjective Assessment - 11/12/15 1036    Subjective  S: When I move I feel a little pain.   Currently in Pain? Yes   Pain Score 4    Pain Location Shoulder   Pain Orientation Right   Pain Descriptors / Indicators Aching   Pain Type Acute pain            [facility] OT Assessment - 11/12/15 1040    Assessment   Diagnosis Right total shoulder arthroplasty   Precautions   Precautions Shoulder   Type of Shoulder Precautions See Protocol.                   OT Treatments/Exercises (OP) - 11/12/15 1037    Exercises   Exercises Shoulder   Shoulder Exercises: Supine   Protraction PROM;5 reps;AROM;12 reps   Horizontal ABduction PROM;5 reps;AROM;12 reps   External Rotation PROM;5 reps;AROM;12 reps   Internal Rotation PROM;5 reps;AROM;12 reps   Flexion PROM;5 reps;AROM;12 reps   ABduction PROM;5 reps;AROM;12 reps   Shoulder Exercises: Standing   Protraction AROM;10 reps   Horizontal ABduction AROM;10 reps   External Rotation AROM;10 reps  Internal Rotation AROM;10 reps   Flexion AROM;10 reps   Extension Theraband;12 reps   Theraband Level (Shoulder Extension) Level 2 (Red)   Row Theraband;12 reps   Theraband Level (Shoulder Row) Level 2 (Red)   Shoulder Exercises: Pulleys   Flexion 1 minute   ABduction 1 minute   Shoulder Exercises: ROM/Strengthening   Proximal Shoulder Strengthening, Supine 10X                  OT Short Term Goals - 10/24/15 1532    OT SHORT TERM GOAL #1   Title Patient will be educated and independent with HEP to increase functional use of RUE.   Time 4   Period Weeks   Status On-going   OT SHORT TERM GOAL #2   Title Patient will increase P/ROM of RUE to [facility] to increase ability to get shirts on and off.   Time 4   Period Weeks   Status On-going   OT SHORT TERM GOAL #3   Title Patient will increase RUE strength to 3/5  to increase ability to complete activities below shoulder level.    Time 4   Period Weeks   Status Partially Met   OT SHORT TERM GOAL #4   Title Patient will decrease pain level to 5/10 when using RUE to increase ability to use RUE for 50% of daily tasks.    Time 4   Period Weeks   Status Achieved   OT SHORT TERM GOAL #5   Title Patient will decrease fascial restrictions to mod amount to increase functional mobility of RUE.    Time 4   Period Weeks   Status On-going           OT Long Term Goals - 10/01/15 1518    OT LONG TERM GOAL #1   Title Patient will return to highest level of independence using RUE for all daily and leisure tasks.    Time 8   Period Weeks   Status On-going   OT LONG TERM GOAL #2   Title Patient will increase RUE A/ROM to [facility] to increase ability to complete tasks above shoulder.    Time 8   Period Weeks   Status On-going   OT LONG TERM GOAL #3   Title Patient will increase RUE strength to 4+/5 to increase ability to return to golf and bowling activities.    Time 8   Period Weeks   Status On-going   OT LONG TERM GOAL #4   Title Patient will decrease pain level in RUE during daily tasks to 3/10 or less.   Time 8   Period Weeks   Status On-going   OT LONG TERM GOAL #5   Title Patient will decrease fascial restrictions to min amount to increase functional mobility needed to return to leisure tasks.    Time 8   Period Weeks   Status On-going               Plan - 11/12/15 1157    Clinical Impression Statement A: Therapist notes decreased fascial restrictions overall this session with one small knot noted in the medial deltoid. Did not complete abduction in standing as patient experienced increased difficulty with movement in supine. Focus on increasing range of motion with pulleys.   Plan P: Initiate protocol for weels 9-12. Continue focusing on increasing range of motion with passive stretching as well as during A/ROM exercises.       Patient will benefit from skilled therapeutic  intervention in order to improve the following deficits and impairments:  Decreased strength, Pain, Impaired UE functional use, Increased fascial restricitons, Decreased range of motion  Visit Diagnosis: Pain in right shoulder  Stiffness of right shoulder joint  Other symptoms and signs involving the musculoskeletal system    Problem List Patient Active Problem List   Diagnosis Date Noted  . S/P shoulder replacement 08/29/2012  . Pain in joint, shoulder region 08/29/2012  . Muscle weakness (generalized) 08/29/2012  . Arthritis of shoulder 08/12/2012    Ailene Ravel, OTR/L,CBIS  [phone]  11/12/2015, 12:58 PM  [place], [place], [address] Phone: [phone]   Fax:  [phone]  Name: Scott Zamora MRN: [id number] Date of Birth: April 27, 1935

## 2015-11-14 ENCOUNTER — Encounter (HOSPITAL_COMMUNITY): Payer: Self-pay

## 2015-11-14 ENCOUNTER — Encounter (HOSPITAL_COMMUNITY): Payer: TRICARE For Life (TFL)

## 2015-11-14 ENCOUNTER — Ambulatory Visit (HOSPITAL_COMMUNITY): Payer: Medicare Other

## 2015-11-14 DIAGNOSIS — M25611 Stiffness of right shoulder, not elsewhere classified: Secondary | ICD-10-CM

## 2015-11-14 DIAGNOSIS — M25511 Pain in right shoulder: Secondary | ICD-10-CM | POA: Diagnosis not present

## 2015-11-14 DIAGNOSIS — R29898 Other symptoms and signs involving the musculoskeletal system: Secondary | ICD-10-CM | POA: Diagnosis not present

## 2015-11-14 NOTE — Therapy (Signed)
[place], [place], [address] Phone: [phone]   Fax:  [phone]  Occupational Therapy Treatment  Patient Details  Name: Scott Zamora MRN: [id number] Date of Birth: 05/07/1935 Referring Provider: Justice Britain, MD  Encounter Date: 11/14/2015      OT End of Session - 11/14/15 1422    Visit Number 17   Number of Visits 24   Date for OT Re-Evaluation 11/23/15   Authorization Type 1) Medicare 2) [facility] 3)[employer]   Authorization Time Period before 20th visit   Authorization - Visit Number 17   Authorization - Number of Visits 20   OT Start Time 1330   OT Stop Time 1415   OT Time Calculation (min) 45 min   Activity Tolerance Patient tolerated treatment well   Behavior During Therapy [facility] for tasks assessed/performed      Past Medical History  Diagnosis Date  . Hypertension   . Vertigo     hx of  . Sleep apnea     has CPAP but does not wear it  . Seasonal allergies   . Arthritis   . Dry skin   . Inguinal hernia, left   . GERD (gastroesophageal reflux disease)     Past Surgical History  Procedure Laterality Date  . Knee arthroscopy      left knee  . Colonoscopy w/ polypectomy    . Total shoulder arthroplasty  08/11/2012    Dr Justice Britain  . Total shoulder arthroplasty  08/11/2012    Procedure: TOTAL SHOULDER ARTHROPLASTY;  Surgeon: Marin Shutter, MD;  Location: [place];  Service: Orthopedics;  Laterality: Left;  . Inguinal hernia repair Left 04/18/2014    Procedure: HERNIA REPAIR INGUINAL ADULT;  Surgeon: Jamesetta So, MD;  Location: [facility];  Service: General;  Laterality: Left;  . Insertion of mesh Left 04/18/2014    Procedure: INSERTION OF MESH;  Surgeon: Jamesetta So, MD;  Location[facility];  Service: General;  Laterality: Left;  . Hernia repair    . Eye surgery Bilateral   . Total shoulder arthroplasty Right 09/12/2015    Procedure: RIGHT TOTAL SHOULDER ARTHROPLASTY;  Surgeon: Justice Britain, MD;   Location: [place];  Service: Orthopedics;  Laterality: Right;    There were no vitals filed for this visit.      Subjective Assessment - 11/14/15 1331    Subjective  S: When I move it hurts a little.   Currently in Pain? No/denies            [facility] OT Assessment - 11/14/15 1347    Assessment   Diagnosis Right total shoulder arthroplasty   Precautions   Precautions Shoulder   Type of Shoulder Precautions See Protocol.                   OT Treatments/Exercises (OP) - 11/14/15 1347    Exercises   Exercises Shoulder   Shoulder Exercises: Supine   Protraction PROM;5 reps;AROM;12 reps   Horizontal ABduction PROM;5 reps;AROM;12 reps   External Rotation PROM;5 reps;AROM;12 reps   Internal Rotation PROM;5 reps;AROM;12 reps   Flexion PROM;5 reps;AROM;12 reps   ABduction PROM;5 reps;AROM;12 reps   Shoulder Exercises: Prone   Other Prone Exercises [place] mobility exercise in quadruped   Manual Therapy   Manual Therapy Myofascial release;Muscle Energy Technique   Manual therapy comments manual therapy completed prior to therapeutic exercise this date   Myofascial Release Myofascial release to right upper arm, trapezius,  and scapularis regions to decrease pain and fascial restrictions and increase joint mobility   Muscle Energy Technique Muscle energry technique to right anterior deltoid to decrease muscle spasms and increase joint ROM.                 OT Education - 11/14/15 1350    Education provided No   Education Details --   Forensic psychologist) Educated --   Methods --   Comprehension --          OT Short Term Goals - 11/14/15 1427    OT SHORT TERM GOAL #1   Title Patient will be educated and independent with HEP to increase functional use of RUE.   Time 4   Period Weeks   Status On-going   OT SHORT TERM GOAL #2   Title Patient will increase P/ROM of RUE to [facility] to increase ability to get shirts on and off.   Time 4   Period Weeks   Status On-going   OT  SHORT TERM GOAL #3   Title Patient will increase RUE strength to 3/5 to increase ability to complete activities below shoulder level.    Time 4   Period Weeks   Status Partially Met   OT SHORT TERM GOAL #4   Title Patient will decrease pain level to 5/10 when using RUE to increase ability to use RUE for 50% of daily tasks.    Time 4   Period Weeks   OT SHORT TERM GOAL #5   Title Patient will decrease fascial restrictions to mod amount to increase functional mobility of RUE.    Time 4   Period Weeks   Status On-going           OT Long Term Goals - 10/01/15 1518    OT LONG TERM GOAL #1   Title Patient will return to highest level of independence using RUE for all daily and leisure tasks.    Time 8   Period Weeks   Status On-going   OT LONG TERM GOAL #2   Title Patient will increase RUE A/ROM to [facility] to increase ability to complete tasks above shoulder.    Time 8   Period Weeks   Status On-going   OT LONG TERM GOAL #3   Title Patient will increase RUE strength to 4+/5 to increase ability to return to golf and bowling activities.    Time 8   Period Weeks   Status On-going   OT LONG TERM GOAL #4   Title Patient will decrease pain level in RUE during daily tasks to 3/10 or less.   Time 8   Period Weeks   Status On-going   OT LONG TERM GOAL #5   Title Patient will decrease fascial restrictions to min amount to increase functional mobility needed to return to leisure tasks.    Time 8   Period Weeks   Status On-going               Plan - 11/14/15 1422    Clinical Impression Statement A: Attempted [place] exercises in prone this session but did not complete due to significantly restricted scapular range of motion.   Plan P: Provide shoulder stretch HEP. Continue with aggressive stretching to increase range of motion.      Patient will benefit from skilled therapeutic intervention in order to improve the following deficits and impairments:  Decreased strength,  Pain, Impaired UE functional use, Increased fascial restricitons, Decreased range of motion  Visit  Diagnosis: Pain in right shoulder  Stiffness of right shoulder joint  Other symptoms and signs involving the musculoskeletal system    Problem List Patient Active Problem List   Diagnosis Date Noted  . S/P shoulder replacement 08/29/2012  . Pain in joint, shoulder region 08/29/2012  . Muscle weakness (generalized) 08/29/2012  . Arthritis of shoulder 08/12/2012    Ailene Ravel, OTR/L,CBIS  [phone]  11/14/2015, 2:27 PM  [place], [place], [address] Phone: [phone]   Fax:  [phone]  Name: Scott Zamora MRN: [id number] Date of Birth: 11/14/1934

## 2015-11-19 ENCOUNTER — Encounter (HOSPITAL_COMMUNITY): Payer: TRICARE For Life (TFL)

## 2015-11-20 DIAGNOSIS — R498 Other voice and resonance disorders: Secondary | ICD-10-CM | POA: Diagnosis not present

## 2015-11-20 DIAGNOSIS — I10 Essential (primary) hypertension: Secondary | ICD-10-CM | POA: Diagnosis not present

## 2015-11-20 DIAGNOSIS — M159 Polyosteoarthritis, unspecified: Secondary | ICD-10-CM | POA: Diagnosis not present

## 2015-11-21 ENCOUNTER — Encounter (HOSPITAL_COMMUNITY): Payer: Self-pay

## 2015-11-21 ENCOUNTER — Ambulatory Visit (HOSPITAL_COMMUNITY): Payer: Medicare Other

## 2015-11-21 DIAGNOSIS — R29898 Other symptoms and signs involving the musculoskeletal system: Secondary | ICD-10-CM | POA: Diagnosis not present

## 2015-11-21 DIAGNOSIS — M25611 Stiffness of right shoulder, not elsewhere classified: Secondary | ICD-10-CM

## 2015-11-21 DIAGNOSIS — M25511 Pain in right shoulder: Secondary | ICD-10-CM | POA: Diagnosis not present

## 2015-11-21 NOTE — Therapy (Signed)
[place], [place], [address] Phone: [phone]   Fax:  [phone]  Occupational Therapy Treatment  Patient Details  Name: Scott Zamora MRN: [id number] Date of Birth: [birth year] Referring Provider: Justice Britain, MD  Encounter Date: 11/21/2015      OT End of Session - 11/21/15 1117    Visit Number 18   Number of Visits 24   Date for OT Re-Evaluation 11/23/15   Authorization Type 1) Medicare 2) [facility] 3)[employer]   Authorization Time Period before 20th visit   Authorization - Visit Number 17   Authorization - Number of Visits 20   OT Start Time 1030   OT Stop Time 1115   OT Time Calculation (min) 45 min   Activity Tolerance Patient tolerated treatment well   Behavior During Therapy [facility] for tasks assessed/performed      Past Medical History  Diagnosis Date  . Hypertension   . Vertigo     hx of  . Sleep apnea     has CPAP but does not wear it  . Seasonal allergies   . Arthritis   . Dry skin   . Inguinal hernia, left   . GERD (gastroesophageal reflux disease)     Past Surgical History  Procedure Laterality Date  . Knee arthroscopy      left knee  . Colonoscopy w/ polypectomy    . Total shoulder arthroplasty  08/11/2012    Dr Justice Britain  . Total shoulder arthroplasty  08/11/2012    Procedure: TOTAL SHOULDER ARTHROPLASTY;  Surgeon: Marin Shutter, MD;  Location: [place];  Service: Orthopedics;  Laterality: Left;  . Inguinal hernia repair Left 04/18/2014    Procedure: HERNIA REPAIR INGUINAL ADULT;  Surgeon: Jamesetta So, MD;  Location: [facility];  Service: General;  Laterality: Left;  . Insertion of mesh Left 04/18/2014    Procedure: INSERTION OF MESH;  Surgeon: Jamesetta So, MD;  Location: [facility];  Service: General;  Laterality: Left;  . Hernia repair    . Eye surgery Bilateral   . Total shoulder arthroplasty Right 09/12/2015    Procedure: RIGHT TOTAL SHOULDER ARTHROPLASTY;  Surgeon: Justice Britain, MD;   Location: [place];  Service: Orthopedics;  Laterality: Right;    There were no vitals filed for this visit.      Subjective Assessment - 11/21/15 1056    Subjective  S: This shoulder is still not as mobile as I'd like.   Currently in Pain? No/denies            [facility] OT Assessment - 11/21/15 1057    Assessment   Diagnosis Right total shoulder arthroplasty   Precautions   Precautions Shoulder   Type of Shoulder Precautions See Protocol.                   OT Treatments/Exercises (OP) - 11/21/15 1057    Exercises   Exercises Shoulder   Shoulder Exercises: Supine   Protraction PROM;5 reps;AROM;15 reps   Horizontal ABduction PROM;5 reps;AROM;15 reps   External Rotation PROM;5 reps;AROM;15 reps   Internal Rotation PROM;5 reps;AROM;15 reps   Flexion PROM;5 reps;AROM;15 reps   ABduction PROM;5 reps;AROM;15 reps   Shoulder Exercises: Standing   Retraction AROM;10 reps   Shoulder Exercises: ROM/Strengthening   UBE (Upper Arm Bike) Level 1 2' forward 2' reverse   Proximal Shoulder Strengthening, Supine 12X   Shoulder Exercises: Stretch   Internal Rotation Stretch 3 reps  10 seconds  Wall Stretch - Flexion 3 reps;10 seconds   Other Shoulder Stretches Doorway stretch; 4 sets; 10" hold   Other Shoulder Stretches Posterior Capsule stretch; 3 times; 10 seconds   Manual Therapy   Manual Therapy Myofascial release;Muscle Energy Technique   Manual therapy comments manual therapy completed prior to therapeutic exercise this date   Myofascial Release Myofascial release to right upper arm, trapezius, and scapularis regions to decrease pain and fascial restrictions and increase joint mobility                OT Education - 11/21/15 1117    Education provided Yes   Education Details Shoulder stretches   Person(s) Educated Patient   Methods Explanation;Demonstration;Verbal cues;Handout   Comprehension Returned demonstration;Verbalized understanding          OT  Short Term Goals - 11/14/15 1427    OT SHORT TERM GOAL #1   Title Patient will be educated and independent with HEP to increase functional use of RUE.   Time 4   Period Weeks   Status On-going   OT SHORT TERM GOAL #2   Title Patient will increase P/ROM of RUE to [facility] to increase ability to get shirts on and off.   Time 4   Period Weeks   Status On-going   OT SHORT TERM GOAL #3   Title Patient will increase RUE strength to 3/5 to increase ability to complete activities below shoulder level.    Time 4   Period Weeks   Status Partially Met   OT SHORT TERM GOAL #4   Title Patient will decrease pain level to 5/10 when using RUE to increase ability to use RUE for 50% of daily tasks.    Time 4   Period Weeks   OT SHORT TERM GOAL #5   Title Patient will decrease fascial restrictions to mod amount to increase functional mobility of RUE.    Time 4   Period Weeks   Status On-going           OT Long Term Goals - 10/01/15 1518    OT LONG TERM GOAL #1   Title Patient will return to highest level of independence using RUE for all daily and leisure tasks.    Time 8   Period Weeks   Status On-going   OT LONG TERM GOAL #2   Title Patient will increase RUE A/ROM to [facility] to increase ability to complete tasks above shoulder.    Time 8   Period Weeks   Status On-going   OT LONG TERM GOAL #3   Title Patient will increase RUE strength to 4+/5 to increase ability to return to golf and bowling activities.    Time 8   Period Weeks   Status On-going   OT LONG TERM GOAL #4   Title Patient will decrease pain level in RUE during daily tasks to 3/10 or less.   Time 8   Period Weeks   Status On-going   OT LONG TERM GOAL #5   Title Patient will decrease fascial restrictions to min amount to increase functional mobility needed to return to leisure tasks.    Time 8   Period Weeks   Status On-going               Plan - 11/21/15 1151    Clinical Impression Statement A: Updated HEP to  include shoulder stretches to increase functional mobility. Patient continues to be restricted with Passive and Active ROM.    Plan  P: Reassessment/Re-cert. Attempt 1# weight supine. Follow up on new shoulder stretches. Add overhead lacing.       Patient will benefit from skilled therapeutic intervention in order to improve the following deficits and impairments:  Decreased strength, Pain, Impaired UE functional use, Increased fascial restricitons, Decreased range of motion  Visit Diagnosis: Other symptoms and signs involving the musculoskeletal system  Stiffness of right shoulder joint    Problem List Patient Active Problem List   Diagnosis Date Noted  . S/P shoulder replacement 08/29/2012  . Pain in joint, shoulder region 08/29/2012  . Muscle weakness (generalized) 08/29/2012  . Arthritis of shoulder 08/12/2012    Debby Bud 11/21/2015, 11:56 AM  [place] [address] [place], [place], [address] Phone: [phone]   Fax:  [phone]  Name: ARMOND CUTHRELL MRN: [id number] Date of Birth: 1934-12-27

## 2015-11-22 ENCOUNTER — Encounter (HOSPITAL_COMMUNITY): Payer: Self-pay | Admitting: Occupational Therapy

## 2015-11-22 ENCOUNTER — Ambulatory Visit (HOSPITAL_COMMUNITY): Payer: Medicare Other | Admitting: Occupational Therapy

## 2015-11-22 DIAGNOSIS — R29898 Other symptoms and signs involving the musculoskeletal system: Secondary | ICD-10-CM

## 2015-11-22 DIAGNOSIS — M25611 Stiffness of right shoulder, not elsewhere classified: Secondary | ICD-10-CM | POA: Diagnosis not present

## 2015-11-22 DIAGNOSIS — M25511 Pain in right shoulder: Secondary | ICD-10-CM

## 2015-11-22 NOTE — Therapy (Signed)
[place], [place], [address] Phone: [phone]   Fax:  [phone]  Occupational Therapy Reassessment, Treatment, Recertification  Patient Details  Name: Scott Zamora MRN: [id number] Date of Birth: 1934-08-06 Referring Provider: Justice Britain, MD  Encounter Date: 11/22/2015      OT End of Session - 11/22/15 1602    Visit Number 19   Number of Visits 24   Date for OT Re-Evaluation 12/22/15   Authorization Type 1) Medicare 2) [facility] 3)[employer]   Authorization Time Period before 20th visit   Authorization - Visit Number 18   Authorization - Number of Visits 20   OT Start Time 1515   OT Stop Time 1559   OT Time Calculation (min) 44 min   Activity Tolerance Patient tolerated treatment well   Behavior During Therapy [facility] for tasks assessed/performed      Past Medical History  Diagnosis Date  . Hypertension   . Vertigo     hx of  . Sleep apnea     has CPAP but does not wear it  . Seasonal allergies   . Arthritis   . Dry skin   . Inguinal hernia, left   . GERD (gastroesophageal reflux disease)     Past Surgical History  Procedure Laterality Date  . Knee arthroscopy      left knee  . Colonoscopy w/ polypectomy    . Total shoulder arthroplasty  08/11/2012    Dr Justice Britain  . Total shoulder arthroplasty  08/11/2012    Procedure: TOTAL SHOULDER ARTHROPLASTY;  Surgeon: Marin Shutter, MD;  Location: [place];  Service: Orthopedics;  Laterality: Left;  . Inguinal hernia repair Left 04/18/2014    Procedure: HERNIA REPAIR INGUINAL ADULT;  Surgeon: Jamesetta So, MD;  Location: [facility];  Service: General;  Laterality: Left;  . Insertion of mesh Left 04/18/2014    Procedure: INSERTION OF MESH;  Surgeon: Jamesetta So, MD;  Location: [facility];  Service: General;  Laterality: Left;  . Hernia repair    . Eye surgery Bilateral   . Total shoulder arthroplasty Right 09/12/2015    Procedure: RIGHT TOTAL SHOULDER  ARTHROPLASTY;  Surgeon: Justice Britain, MD;  Location: [place];  Service: Orthopedics;  Laterality: Right;    There were no vitals filed for this visit.      Subjective Assessment - 11/22/15 1517    Subjective  S: The only time I have pain is if I sleep on that arm.    Currently in Pain? No/denies           [facility] OT Assessment - 11/22/15 1515    Assessment   Diagnosis Right total shoulder arthroplasty   Precautions   Precautions Shoulder   Type of Shoulder Precautions See Protocol.    Palpation   Palpation comment Mod fascial restrictions in right upper arm ,trapezius, and scapularis region.    AROM   Overall AROM Comments Assessed supine, ER/IR adducted   AROM Assessment Site Shoulder   Right/Left Shoulder Right   Right Shoulder Flexion 126 Degrees  114 previous   Right Shoulder ABduction 135 Degrees  76 previous   Right Shoulder Internal Rotation 85 Degrees  same as previous   Right Shoulder External Rotation 50 Degrees  41 previous   PROM   Overall PROM Comments Assessed supine. IR/er adducted   PROM Assessment Site Shoulder   Right/Left Shoulder Right   Right Shoulder Flexion 127 Degrees  121 previous  Right Shoulder ABduction 140 Degrees  109 previous   Right Shoulder Internal Rotation 90 Degrees  same as previous   Right Shoulder External Rotation 65 Degrees  55 previous   Strength   Overall Strength Comments Assessed seated, ER/IR adducted   Strength Assessment Site Shoulder   Right/Left Shoulder Right   Right Shoulder Flexion 4-/5  3/5 previous   Right Shoulder ABduction 4/5  3-/5 previous   Right Shoulder Internal Rotation 4/5  3/5 previous   Right Shoulder External Rotation 4/5  3/5 previous                  OT Treatments/Exercises (OP) - 11/22/15 1517    Exercises   Exercises Shoulder   Shoulder Exercises: Supine   Protraction PROM;5 reps;Strengthening;10 reps   Protraction Weight (lbs) 1   Horizontal ABduction PROM;5  reps;Strengthening;10 reps   Horizontal ABduction Weight (lbs) 1   External Rotation PROM;5 reps;Strengthening;10 reps   External Rotation Weight (lbs) 1   Internal Rotation PROM;5 reps;Strengthening;10 reps   Internal Rotation Weight (lbs) 1   Flexion PROM;5 reps;Strengthening;10 reps   Shoulder Flexion Weight (lbs) 1   ABduction PROM;5 reps;Strengthening;10 reps   Shoulder ABduction Weight (lbs) 1   Shoulder Exercises: Standing   Protraction AROM;10 reps   Horizontal ABduction AROM;10 reps   External Rotation AROM;10 reps   Internal Rotation AROM;10 reps   Flexion AROM;10 reps   ABduction AROM;10 reps   Shoulder Exercises: ROM/Strengthening   Proximal Shoulder Strengthening, Supine 10X each with 1# weight   Shoulder Exercises: Stretch   Other Shoulder Stretches Doorway stretch; 4 sets; 10" hold   Manual Therapy   Manual Therapy Myofascial release;Muscle Energy Technique   Manual therapy comments manual therapy completed prior to therapeutic exercise this date   Myofascial Release Myofascial release to right upper arm, trapezius, and scapularis regions to decrease pain and fascial restrictions and increase joint mobility               OT Education - 11/21/15 1117    Education provided Yes   Education Details Shoulder stretches   Person(s) Educated Patient   Methods Explanation;Demonstration;Verbal cues;Handout   Comprehension Returned demonstration;Verbalized understanding          OT Short Term Goals - 11/22/15 1549    OT SHORT TERM GOAL #1   Title Patient will be educated and independent with HEP to increase functional use of RUE.   Time 4   Period Weeks   Status On-going   OT SHORT TERM GOAL #2   Title Patient will increase P/ROM of RUE to WFL to increase ability to get shirts on and off.   Time 4   Period Weeks   Status On-going   OT SHORT TERM GOAL #3   Title Patient will increase RUE strength to 3/5 to increase ability to complete activities below  shoulder level.    Time 4   Period Weeks   Status Achieved   OT SHORT TERM GOAL #4   Title Patient will decrease pain level to 5/10 when using RUE to increase ability to use RUE for 50% of daily tasks.    Time 4   Period Weeks   OT SHORT TERM GOAL #5   Title Patient will decrease fascial restrictions to mod amount to increase functional mobility of RUE.    Time 4   Period Weeks   Status Achieved           OT Long Term Goals -   11/22/15 1549    OT LONG TERM GOAL #1   Title Patient will return to highest level of independence using RUE for all daily and leisure tasks.    Time 8   Period Weeks   Status On-going   OT LONG TERM GOAL #2   Title Patient will increase RUE A/ROM to [facility] to increase ability to complete tasks above shoulder.    Time 8   Period Weeks   Status On-going   OT LONG TERM GOAL #3   Title Patient will increase RUE strength to 4+/5 to increase ability to return to golf and bowling activities.    Time 8   Period Weeks   Status On-going   OT LONG TERM GOAL #4   Title Patient will decrease pain level in RUE during daily tasks to 3/10 or less.   Time 8   Period Weeks   Status Achieved   OT LONG TERM GOAL #5   Title Patient will decrease fascial restrictions to min amount to increase functional mobility needed to return to leisure tasks.    Time 8   Period Weeks   Status On-going               Plan - 11/22/15 1602    Clinical Impression Statement A: Reassessment completed this session, pt has met 3/5 STGs and 1/5 LTGs and is progressing towards remaining goals. Pt continues to experience limitations in P/ROM, A/ROM, increased fascial restrictions, and decreased strength, limiting ability to complete daily tasks with RUE. Pt reports improvements in all areas of daily and leisure tasks, including dressing, grooming, and eating. Pt was able to use hedge trimmers this past week with minimal difficulty. Continue skilled OT services 2x/week for 4 weeks to  focus on increasing ROM, strength, and ability to use RUE functionally during daily tasks.    Rehab Potential Excellent   Clinical Impairments Affecting Rehab Potential PMH significant for HTN, arthritis, GERD, inguinal hernia, vertigo, sleep apnea, L knee arthroscopy, and L TSA (2014)   OT Frequency 2x / week   OT Duration 4 weeks   OT Treatment/Interventions Self-care/ADL training;Ultrasound;DME and/or AE instruction;Scar mobilization;Iontophoresis;Passive range of motion;Patient/family education;Cryotherapy;Electrical Stimulation;Moist Heat;Therapeutic activities;Therapeutic exercises;Manual Therapy   Plan P: Update G-code, add overhead lacing, continue working on improving P/ROM and A/ROM to [facility].       Patient will benefit from skilled therapeutic intervention in order to improve the following deficits and impairments:  Decreased strength, Pain, Impaired UE functional use, Increased fascial restricitons, Decreased range of motion  Visit Diagnosis: Stiffness of right shoulder joint  Other symptoms and signs involving the musculoskeletal system  Pain in right shoulder    Problem List Patient Active Problem List   Diagnosis Date Noted  . S/P shoulder replacement 08/29/2012  . Pain in joint, shoulder region 08/29/2012  . Muscle weakness (generalized) 08/29/2012  . Arthritis of shoulder 08/12/2012    Guadelupe Sabin, OTR/L  [phone] 11/22/2015, 4:10 PM  [place] [address] [place], [place], [address] Phone: [phone]   Fax:  [phone]  Name: AIMAN NOE MRN: [id number] Date of Birth: 22-Jun-1935

## 2015-11-26 ENCOUNTER — Ambulatory Visit (HOSPITAL_COMMUNITY): Payer: Medicare Other

## 2015-11-26 ENCOUNTER — Encounter (HOSPITAL_COMMUNITY): Payer: Self-pay

## 2015-11-26 DIAGNOSIS — M25511 Pain in right shoulder: Secondary | ICD-10-CM | POA: Diagnosis not present

## 2015-11-26 DIAGNOSIS — M25611 Stiffness of right shoulder, not elsewhere classified: Secondary | ICD-10-CM

## 2015-11-26 DIAGNOSIS — R29898 Other symptoms and signs involving the musculoskeletal system: Secondary | ICD-10-CM | POA: Diagnosis not present

## 2015-11-26 NOTE — Therapy (Signed)
[place], [place], [address] Phone: [phone]   Fax:  [phone]  Occupational Therapy Treatment  Patient Details  Name: Scott Zamora MRN: [id number] Date of Birth: 10-09-1934 Referring Provider: Justice Britain, MD  Encounter Date: 11/26/2015      OT End of Session - 11/26/15 1101    Visit Number 20   Number of Visits 24   Date for OT Re-Evaluation 12/22/15   Authorization Type 1) Medicare 2) [facility] 3)[employer]   Authorization Time Period before 29th visit   Authorization - Visit Number 19   Authorization - Number of Visits 29   OT Start Time 1018   OT Stop Time 1102   OT Time Calculation (min) 44 min   Activity Tolerance Patient tolerated treatment well   Behavior During Therapy [facility] for tasks assessed/performed      Past Medical History  Diagnosis Date  . Hypertension   . Vertigo     hx of  . Sleep apnea     has CPAP but does not wear it  . Seasonal allergies   . Arthritis   . Dry skin   . Inguinal hernia, left   . GERD (gastroesophageal reflux disease)     Past Surgical History  Procedure Laterality Date  . Knee arthroscopy      left knee  . Colonoscopy w/ polypectomy    . Total shoulder arthroplasty  08/11/2012    Dr Justice Britain  . Total shoulder arthroplasty  08/11/2012    Procedure: TOTAL SHOULDER ARTHROPLASTY;  Surgeon: Marin Shutter, MD;  Location: [place];  Service: Orthopedics;  Laterality: Left;  . Inguinal hernia repair Left 04/18/2014    Procedure: HERNIA REPAIR INGUINAL ADULT;  Surgeon: Jamesetta So, MD;  Location: [facility];  Service: General;  Laterality: Left;  . Insertion of mesh Left 04/18/2014    Procedure: INSERTION OF MESH;  Surgeon: Jamesetta So, MD;  Location: [facility];  Service: General;  Laterality: Left;  . Hernia repair    . Eye surgery Bilateral   . Total shoulder arthroplasty Right 09/12/2015    Procedure: RIGHT TOTAL SHOULDER ARTHROPLASTY;  Surgeon: Justice Britain, MD;   Location: [place];  Service: Orthopedics;  Laterality: Right;    There were no vitals filed for this visit.      Subjective Assessment - 11/26/15 1023    Subjective  S: I think my shoulder is getting better.   Currently in Pain? No/denies            [facility] OT Assessment - 11/26/15 1039    Assessment   Diagnosis Right total shoulder arthroplasty   Precautions   Precautions Shoulder   Type of Shoulder Precautions See Protocol.                   OT Treatments/Exercises (OP) - 11/26/15 1039    Exercises   Exercises Shoulder   Shoulder Exercises: Supine   Protraction PROM;5 reps;Strengthening;10 reps   Protraction Weight (lbs) 1   Horizontal ABduction PROM;5 reps;Strengthening;10 reps   Horizontal ABduction Weight (lbs) 1   External Rotation PROM;5 reps;Strengthening;10 reps   External Rotation Weight (lbs) 1   Internal Rotation PROM;5 reps;Strengthening;10 reps   Internal Rotation Weight (lbs) 1   Flexion PROM;5 reps;Strengthening;10 reps   Shoulder Flexion Weight (lbs) 1   ABduction PROM;5 reps;Strengthening;10 reps   Shoulder ABduction Weight (lbs) 1   Shoulder Exercises: Standing   Protraction AROM;15 reps  Horizontal ABduction AROM;15 reps   External Rotation AROM;15 reps   Internal Rotation AROM;15 reps   Flexion AROM;15 reps   ABduction AROM;15 reps   Extension Theraband;12 reps   Theraband Level (Shoulder Extension) Level 2 (Red)   Row Theraband;12 reps   Theraband Level (Shoulder Row) Level 2 (Red)   Shoulder Exercises: ROM/Strengthening   UBE (Upper Arm Bike) Level 1 2' forward 2' reverse   Over Head Lace 2'   X to V Arms 5X   Proximal Shoulder Strengthening, Supine 10X each with 1# weight   Proximal Shoulder Strengthening, Seated 10X no rest breaks   Manual Therapy   Manual Therapy Myofascial release;Muscle Energy Technique   Manual therapy comments manual therapy completed prior to therapeutic exercise this date   Myofascial Release  Myofascial release to right upper arm, trapezius, and scapularis regions to decrease pain and fascial restrictions and increase joint mobility   Muscle Energy Technique Muscle energry technique to right anterior deltoid to decrease muscle spasms and increase joint ROM.                 OT Education - 11/26/15 1113    Education provided Yes   Education Details Education provided to complete shoulder stretches prior to exercises to increase functional mobility of shoulder.   Person(s) Educated Patient   Methods Explanation   Comprehension Verbalized understanding          OT Short Term Goals - 11/26/15 1111    OT SHORT TERM GOAL #1   Title Patient will be educated and independent with HEP to increase functional use of RUE.   Time 4   Period Weeks   Status On-going   OT SHORT TERM GOAL #2   Title Patient will increase P/ROM of RUE to [facility] to increase ability to get shirts on and off.   Time 4   Period Weeks   Status On-going   OT SHORT TERM GOAL #3   Title Patient will increase RUE strength to 3/5 to increase ability to complete activities below shoulder level.    Time 4   Period Weeks   OT SHORT TERM GOAL #4   Title Patient will decrease pain level to 5/10 when using RUE to increase ability to use RUE for 50% of daily tasks.    Time 4   Period Weeks   OT SHORT TERM GOAL #5   Title Patient will decrease fascial restrictions to mod amount to increase functional mobility of RUE.    Time 4   Period Weeks           OT Long Term Goals - 11/26/15 1112    OT LONG TERM GOAL #1   Title Patient will return to highest level of independence using RUE for all daily and leisure tasks.    Time 8   Period Weeks   Status On-going   OT LONG TERM GOAL #2   Title Patient will increase RUE A/ROM to [facility] to increase ability to complete tasks above shoulder.    Time 8   Period Weeks   Status On-going   OT LONG TERM GOAL #3   Title Patient will increase RUE strength to 4+/5 to  increase ability to return to golf and bowling activities.    Time 8   Period Weeks   Status On-going   OT LONG TERM GOAL #4   Title Patient will decrease pain level in RUE during daily tasks to 3/10 or less.   Time 8  Period Weeks   OT LONG TERM GOAL #5   Title Patient will decrease fascial restrictions to min amount to increase functional mobility needed to return to leisure tasks.    Time 8   Period Weeks   Status On-going               Plan - 12-22-2015 1102    Clinical Impression Statement A: G code updated this date. From last session:Pt continues to experience limitations in P/ROM, A/ROM, increased fascial restrictions, and decreased strength, limiting ability to complete daily tasks with RUE. Pt reports improvements in all areas of daily and leisure tasks, including dressing, grooming, and eating. Pt was able to use hedge trimmers this past week with minimal difficulty   Plan P: Add Cybex row and press.       Patient will benefit from skilled therapeutic intervention in order to improve the following deficits and impairments:  Decreased strength, Pain, Impaired UE functional use, Increased fascial restricitons, Decreased range of motion  Visit Diagnosis: Stiffness of right shoulder joint  Other symptoms and signs involving the musculoskeletal system      G-Codes - Dec 22, 2015 1044    Functional Assessment Tool Used FOTO score: 69/100 (31% impaired)   Functional Limitation Carrying, moving and handling objects   Carrying, Moving and Handling Objects Current Status [id number]) At least 20 percent but less than 40 percent impaired, limited or restricted   Carrying, Moving and Handling Objects Goal Status [id number]) At least 20 percent but less than 40 percent impaired, limited or restricted      Problem List Patient Active Problem List   Diagnosis Date Noted  . S/P shoulder replacement 08/29/2012  . Pain in joint, shoulder region 08/29/2012  . Muscle weakness  (generalized) 08/29/2012  . Arthritis of shoulder 08/12/2012    Ailene Ravel, OTR/L,CBIS  [phone]  22-Dec-2015, 11:14 AM  [place], [place], [address] Phone: [phone]   Fax:  [phone]  Name: Scott Zamora MRN: [id number] Date of Birth: [birth year]

## 2015-11-27 DIAGNOSIS — Z471 Aftercare following joint replacement surgery: Secondary | ICD-10-CM | POA: Diagnosis not present

## 2015-11-27 DIAGNOSIS — Z96611 Presence of right artificial shoulder joint: Secondary | ICD-10-CM | POA: Diagnosis not present

## 2015-11-28 ENCOUNTER — Ambulatory Visit (HOSPITAL_COMMUNITY): Payer: Medicare Other

## 2015-11-28 ENCOUNTER — Encounter (HOSPITAL_COMMUNITY): Payer: Self-pay

## 2015-11-28 DIAGNOSIS — M25511 Pain in right shoulder: Secondary | ICD-10-CM | POA: Diagnosis not present

## 2015-11-28 DIAGNOSIS — M25611 Stiffness of right shoulder, not elsewhere classified: Secondary | ICD-10-CM

## 2015-11-28 DIAGNOSIS — R29898 Other symptoms and signs involving the musculoskeletal system: Secondary | ICD-10-CM | POA: Diagnosis not present

## 2015-11-28 NOTE — Therapy (Signed)
[place], [place], [address] Phone: [phone]   Fax:  [phone]  Occupational Therapy Treatment  Patient Details  Name: Scott Zamora MRN: [id number] Date of Birth: [birth year] Referring Provider: Justice Britain, MD  Encounter Date: 11/28/2015      OT End of Session - 11/28/15 1111    Visit Number 21   Number of Visits 24   Date for OT Re-Evaluation 12/22/15   Authorization Type 1) Medicare 2) [facility] 3)[employer]   Authorization Time Period before 29th visit   Authorization - Visit Number 73   Authorization - Number of Visits 29   OT Start Time 1030   OT Stop Time 1115   OT Time Calculation (min) 45 min   Activity Tolerance Patient tolerated treatment well   Behavior During Therapy [facility] for tasks assessed/performed      Past Medical History  Diagnosis Date  . Hypertension   . Vertigo     hx of  . Sleep apnea     has CPAP but does not wear it  . Seasonal allergies   . Arthritis   . Dry skin   . Inguinal hernia, left   . GERD (gastroesophageal reflux disease)     Past Surgical History  Procedure Laterality Date  . Knee arthroscopy      left knee  . Colonoscopy w/ polypectomy    . Total shoulder arthroplasty  08/11/2012    Dr Justice Britain  . Total shoulder arthroplasty  08/11/2012    Procedure: TOTAL SHOULDER ARTHROPLASTY;  Surgeon: Marin Shutter, MD;  Location: [place];  Service: Orthopedics;  Laterality: Left;  . Inguinal hernia repair Left 04/18/2014    Procedure: HERNIA REPAIR INGUINAL ADULT;  Surgeon: Jamesetta So, MD;  Location: [facility];  Service: General;  Laterality: Left;  . Insertion of mesh Left 04/18/2014    Procedure: INSERTION OF MESH;  Surgeon: Jamesetta So, MD;  Location: [facility];  Service: General;  Laterality: Left;  . Hernia repair    . Eye surgery Bilateral   . Total shoulder arthroplasty Right 09/12/2015    Procedure: RIGHT TOTAL SHOULDER ARTHROPLASTY;  Surgeon: Justice Britain, MD;   Location: [place];  Service: Orthopedics;  Laterality: Right;    There were no vitals filed for this visit.      Subjective Assessment - 11/28/15 1043    Subjective  S: Dr. Onnie Graham said I would need 4 more weeks of therapy.   Currently in Pain? No/denies            [facility] OT Assessment - 11/28/15 1044    Assessment   Diagnosis Right total shoulder arthroplasty   Precautions   Precautions Shoulder   Type of Shoulder Precautions See Protocol.                   OT Treatments/Exercises (OP) - 11/28/15 1044    Exercises   Exercises Shoulder   Shoulder Exercises: Supine   Protraction PROM;5 reps;Strengthening;12 reps   Protraction Weight (lbs) 1   Horizontal ABduction PROM;5 reps;Strengthening;12 reps   Horizontal ABduction Weight (lbs) 1   External Rotation PROM;5 reps;Strengthening;12 reps   External Rotation Weight (lbs) 1   Internal Rotation PROM;5 reps;Strengthening;12 reps   Internal Rotation Weight (lbs) 1   Flexion PROM;5 reps;Strengthening;12 reps   Shoulder Flexion Weight (lbs) 1   ABduction PROM;5 reps;Strengthening;12 reps   Shoulder ABduction Weight (lbs) 1   Shoulder Exercises: Standing  Protraction Strengthening;10 reps   Protraction Weight (lbs) 1   Horizontal ABduction Strengthening;10 reps   Horizontal ABduction Weight (lbs) 1   External Rotation Strengthening;10 reps   External Rotation Weight (lbs) 1   Internal Rotation Strengthening;10 reps   Internal Rotation Weight (lbs) 1   Flexion Strengthening;10 reps   Shoulder Flexion Weight (lbs) 1   ABduction Strengthening;10 reps   Shoulder ABduction Weight (lbs) 1   Extension Theraband;12 reps   Theraband Level (Shoulder Extension) Level 2 (Red)   Row Theraband;12 reps   Theraband Level (Shoulder Row) Level 2 (Red)   Retraction Theraband;12 reps   Theraband Level (Shoulder Retraction) Level 2 (Red)   Shoulder Exercises: ROM/Strengthening   UBE (Upper Arm Bike) Level 1 2' forward 2' reverse    Cybex Press 2 plate;10 reps   Cybex Row 2 plate;10 reps   "W" Arms 10X   X to V Arms 10X   Proximal Shoulder Strengthening, Supine 12X each with 1# weight   Proximal Shoulder Strengthening, Seated 10X no rest breaks   Manual Therapy   Manual Therapy Myofascial release;Muscle Energy Technique   Manual therapy comments manual therapy completed prior to therapeutic exercise this date   Myofascial Release Myofascial release to right upper arm, trapezius, and scapularis regions to decrease pain and fascial restrictions and increase joint mobility   Muscle Energy Technique Muscle energry technique to right anterior deltoid to decrease muscle spasms and increase joint ROM.                   OT Short Term Goals - 11/26/15 1111    OT SHORT TERM GOAL #1   Title Patient will be educated and independent with HEP to increase functional use of RUE.   Time 4   Period Weeks   Status On-going   OT SHORT TERM GOAL #2   Title Patient will increase P/ROM of RUE to [facility] to increase ability to get shirts on and off.   Time 4   Period Weeks   Status On-going   OT SHORT TERM GOAL #3   Title Patient will increase RUE strength to 3/5 to increase ability to complete activities below shoulder level.    Time 4   Period Weeks   OT SHORT TERM GOAL #4   Title Patient will decrease pain level to 5/10 when using RUE to increase ability to use RUE for 50% of daily tasks.    Time 4   Period Weeks   OT SHORT TERM GOAL #5   Title Patient will decrease fascial restrictions to mod amount to increase functional mobility of RUE.    Time 4   Period Weeks           OT Long Term Goals - 11/26/15 1112    OT LONG TERM GOAL #1   Title Patient will return to highest level of independence using RUE for all daily and leisure tasks.    Time 8   Period Weeks   Status On-going   OT LONG TERM GOAL #2   Title Patient will increase RUE A/ROM to [facility] to increase ability to complete tasks above shoulder.    Time  8   Period Weeks   Status On-going   OT LONG TERM GOAL #3   Title Patient will increase RUE strength to 4+/5 to increase ability to return to golf and bowling activities.    Time 8   Period Weeks   Status On-going   OT LONG TERM GOAL #  4   Title Patient will decrease pain level in RUE during daily tasks to 3/10 or less.   Time 8   Period Weeks   OT LONG TERM GOAL #5   Title Patient will decrease fascial restrictions to min amount to increase functional mobility needed to return to leisure tasks.    Time 8   Period Weeks   Status On-going               Plan - 11/28/15 1111    Clinical Impression Statement A: Added cybex row and press and weight to standing exercises. VC for form and technique. Pt continous to have limitation with A/ROM although he continues to make progress.    Plan P: Add PVC pipe.      Patient will benefit from skilled therapeutic intervention in order to improve the following deficits and impairments:  Decreased strength, Pain, Impaired UE functional use, Increased fascial restricitons, Decreased range of motion  Visit Diagnosis: Stiffness of right shoulder joint  Other symptoms and signs involving the musculoskeletal system    Problem List Patient Active Problem List   Diagnosis Date Noted  . S/P shoulder replacement 08/29/2012  . Pain in joint, shoulder region 08/29/2012  . Muscle weakness (generalized) 08/29/2012  . Arthritis of shoulder 08/12/2012    Ailene Ravel, OTR/L,CBIS  [phone]  11/28/2015, 11:13 AM  [place], [place], [address] Phone: [phone]   Fax:  [phone]  Name: KAMDEN LEAPER MRN: [id number] Date of Birth: [birth year]

## 2015-12-02 ENCOUNTER — Encounter (HOSPITAL_COMMUNITY): Payer: Self-pay | Admitting: Emergency Medicine

## 2015-12-02 ENCOUNTER — Emergency Department (HOSPITAL_COMMUNITY)
Admission: EM | Admit: 2015-12-02 | Discharge: 2015-12-02 | Disposition: A | Discharge status: Home / Self Care | Payer: Medicare Other | Attending: Emergency Medicine | Admitting: Emergency Medicine

## 2015-12-02 DIAGNOSIS — Z7982 Long term (current) use of aspirin: Secondary | ICD-10-CM | POA: Diagnosis not present

## 2015-12-02 DIAGNOSIS — S80861A Insect bite (nonvenomous), right lower leg, initial encounter: Secondary | ICD-10-CM | POA: Diagnosis not present

## 2015-12-02 DIAGNOSIS — Y929 Unspecified place or not applicable: Secondary | ICD-10-CM | POA: Insufficient documentation

## 2015-12-02 DIAGNOSIS — M1990 Unspecified osteoarthritis, unspecified site: Secondary | ICD-10-CM | POA: Insufficient documentation

## 2015-12-02 DIAGNOSIS — I10 Essential (primary) hypertension: Secondary | ICD-10-CM | POA: Diagnosis not present

## 2015-12-02 DIAGNOSIS — Z79899 Other long term (current) drug therapy: Secondary | ICD-10-CM | POA: Diagnosis not present

## 2015-12-02 DIAGNOSIS — W57XXXA Bitten or stung by nonvenomous insect and other nonvenomous arthropods, initial encounter: Secondary | ICD-10-CM | POA: Insufficient documentation

## 2015-12-02 DIAGNOSIS — S80862A Insect bite (nonvenomous), left lower leg, initial encounter: Secondary | ICD-10-CM | POA: Insufficient documentation

## 2015-12-02 DIAGNOSIS — Y999 Unspecified external cause status: Secondary | ICD-10-CM | POA: Insufficient documentation

## 2015-12-02 DIAGNOSIS — Y939 Activity, unspecified: Secondary | ICD-10-CM | POA: Insufficient documentation

## 2015-12-02 MED ORDER — DOXYCYCLINE HYCLATE 100 MG PO CAPS: 100.0000 mg | ORAL_CAPSULE | Freq: Two times a day (BID) | ORAL | Status: DC

## 2015-12-02 NOTE — ED Notes (Signed)
Pt alert & oriented x4, stable gait. Patient given discharge instructions, paperwork & prescription(s). Patient  instructed to stop at the registration desk to finish any additional paperwork. Patient verbalized understanding. Pt left department w/ no further questions. 

## 2015-12-02 NOTE — ED Provider Notes (Signed)
CSN: [id number]     Arrival date & time 12/02/15  0907 History  By signing my name below, I, Mesha Guinyard, attest that this documentation has been prepared under the direction and in the presence of Treatment Team:  Attending Provider: Tanna Furry, MD Physician Assistant: Kem Parkinson, PA-C.  Electronically Signed: Verlee Monte, Medical Scribe. 12/02/2015. 11:40 AM.   Chief Complaint  Patient presents with  . Tick Removal   The history is provided by the patient. No language interpreter was used.    HPI Comments: Scott Zamora is a 81 y.o. male who presents to the Emergency Department complaining of tick bites onset 4 days. Pt has been removing the ticks since onset. Pt has removed ticks from his legs and genitals. Pt has had his wife check his body for tick bites. He states that he is having associated symptoms of bilateral knee pains, and itchiness on his back. He denies pain from bites, irregular aches, pain from bites, or rash.  He denies fever, chills, swelling, vomiting or headaches.    Past Medical History  Diagnosis Date  . Hypertension   . Vertigo     hx of  . Sleep apnea     has CPAP but does not wear it  . Seasonal allergies   . Arthritis   . Dry skin   . Inguinal hernia, left   . GERD (gastroesophageal reflux disease)    Past Surgical History  Procedure Laterality Date  . Knee arthroscopy      left knee  . Colonoscopy w/ polypectomy    . Total shoulder arthroplasty  08/11/2012    Dr Justice Britain  . Total shoulder arthroplasty  08/11/2012    Procedure: TOTAL SHOULDER ARTHROPLASTY;  Surgeon: Marin Shutter, MD;  Location: [place];  Service: Orthopedics;  Laterality: Left;  . Inguinal hernia repair Left 04/18/2014    Procedure: HERNIA REPAIR INGUINAL ADULT;  Surgeon: Jamesetta So, MD;  Location: [facility];  Service: General;  Laterality: Left;  . Insertion of mesh Left 04/18/2014    Procedure: INSERTION OF MESH;  Surgeon: Jamesetta So, MD;  Location: [facility];   Service: General;  Laterality: Left;  . Hernia repair    . Eye surgery Bilateral   . Total shoulder arthroplasty Right 09/12/2015    Procedure: RIGHT TOTAL SHOULDER ARTHROPLASTY;  Surgeon: Justice Britain, MD;  Location: [place];  Service: Orthopedics;  Laterality: Right;   No family history on file. Social History  Substance Use Topics  . Smoking status: Never Smoker   . Smokeless tobacco: Never Used  . Alcohol Use: No    Review of Systems  Constitutional: Negative for fever, chills, activity change and appetite change.  HENT: Negative for facial swelling, sore throat and trouble swallowing.   Respiratory: Negative for chest tightness, shortness of breath and wheezing.   Musculoskeletal: Negative for arthralgias, neck pain and neck stiffness.  Skin: Negative for rash and wound.       Tick bites  Neurological: Negative for dizziness, weakness, numbness and headaches.  All other systems reviewed and are negative.   Allergies  Review of patient's allergies indicates no known allergies.  Home Medications   Prior to Admission medications   Medication Sig Start Date End Date Taking? Authorizing Provider  acetaminophen (TYLENOL) 500 MG tablet Take 500 mg by mouth at bedtime. Reported on 09/26/2015    Historical Provider, MD  aspirin EC 81 MG tablet Take 81 mg by mouth daily.  Historical Provider, MD  Cholecalciferol (VITAMIN D-3) 1000 UNITS CAPS Take 1,000 Units by mouth daily.    Historical Provider, MD  COD LIVER OIL PO Take 1 tablet by mouth daily as needed (for supplementation). Reported on 09/26/2015    Historical Provider, MD  Coenzyme Q10 (COQ-10 PO) Take 1 tablet by mouth daily. Reported on 09/26/2015    Historical Provider, MD  diazepam (VALIUM) 2 MG tablet Take 1 tablet (2 mg total) by mouth every 6 (six) hours as needed for muscle spasms or sedation. Patient not taking: Reported on 09/26/2015 09/13/15   Ralene Batheracy Shuford, PA-C  Digestive Enzymes (PAPAYA AND ENZYMES PO) Take 2 tablets by  mouth daily as needed (for digestive support). Reported on 09/26/2015    Historical Provider, MD  flunisolide (NASALIDE) 25 MCG/ACT (0.025%) SOLN Inhale 2 sprays into the lungs daily as needed. Reported on 09/26/2015    Historical Provider, MD  hydrochlorothiazide (HYDRODIURIL) 25 MG tablet Take 12.5 mg by mouth daily.    Historical Provider, MD  loratadine (CLARITIN) 10 MG tablet Take 10 mg by mouth daily as needed. For allergies    Historical Provider, MD  naproxen (NAPROSYN) 500 MG tablet Take 500 mg by mouth 2 (two) times daily as needed. For pain    Historical Provider, MD  omeprazole (PRILOSEC) 20 MG capsule Take 20 mg by mouth daily.    Historical Provider, MD  oxyCODONE-acetaminophen (PERCOCET) 5-325 MG tablet Take 1-2 tablets by mouth every 4 (four) hours as needed. 09/13/15   Tracy Shuford, PA-C  POTASSIUM PO Take 1 tablet by mouth daily. Reported on 09/26/2015    Historical Provider, MD  sildenafil (VIAGRA) 100 MG tablet Take 50 mg by mouth daily as needed. Reported on 09/26/2015    Historical Provider, MD  tamsulosin (FLOMAX) 0.4 MG CAPS capsule Take 0.4 mg by mouth daily after supper.    Historical Provider, MD  TURMERIC PO Take 300 mg by mouth daily. Reported on 09/26/2015    Historical Provider, MD  verapamil (CALAN-SR) 240 MG CR tablet Take 240 mg by mouth at bedtime.    Historical Provider, MD  vitamin B-12 (CYANOCOBALAMIN) 250 MCG tablet Take 250 mcg by mouth 2 (two) times a week. Reported on 09/26/2015    Historical Provider, MD   BP 137/81 mmHg  Pulse 76  Temp(Src) 98.3 F (36.8 C)  Resp 18  Ht 6' (1.829 m)  Wt 175 lb (79.379 kg)  BMI 23.73 kg/m2  SpO2 100% Physical Exam  Constitutional: He is oriented to person, place, and time. He appears well-developed and well-nourished. No distress.  HENT:  Head: Normocephalic and atraumatic.  Mouth/Throat: Oropharynx is clear and moist.  Eyes: Conjunctivae are normal. Pupils are equal, round, and reactive to light.  Neck: Normal range  of motion. Neck supple.  Cardiovascular: Normal rate, regular rhythm, normal heart sounds and intact distal pulses.  Exam reveals no gallop and no friction rub.   No murmur heard. Pulmonary/Chest: Effort normal and breath sounds normal. No respiratory distress.  Musculoskeletal: He exhibits no edema or tenderness.  Neurological: He is alert and oriented to person, place, and time.  Skin: Skin is warm and dry.  erythematous papules to BLE's without rash or central clearing.  No edema  Psychiatric: He has a normal mood and affect. His behavior is normal.  Nursing note and vitals reviewed.   ED Course  Procedures  DIAGNOSTIC STUDIES: Oxygen Saturation is 100% on RA, NL by my interpretation.    COORDINATION OF CARE: 11:39  AM Discussed treatment plan with pt at bedside and pt agreed to plan.   MDM   Final diagnoses:  Tick bite    Pt well appearing.  No rash on exam.  No fever.  Will start doxy.  Precautions given, pt agrees to close PMD f/u or to return for any worsening sx's  I personally performed the services described in this documentation, which was scribed in my presence. The recorded information has been reviewed and is accurate.   Kem Parkinson, PA-C 12/04/15 1536  Tanna Furry, MD 12/16/15 [phone]

## 2015-12-02 NOTE — ED Notes (Signed)
Pt removed several ticks over the past few days. States he has been feeling bad since, denies any fevers, just feeling bad.

## 2015-12-02 NOTE — Discharge Instructions (Signed)
Tick Bite Information °Ticks are insects that attach themselves to the skin. There are many types of ticks. Common types include wood ticks and deer ticks. Sometimes, ticks carry diseases that can make a person very ill. The most common places for ticks to attach themselves are the scalp, neck, armpits, waist, and groin.  °HOW CAN YOU PREVENT TICK BITES? °Take these steps to help prevent tick bites when you are outdoors: °· Wear long sleeves and long pants. °· Wear white clothes so you can see ticks more easily. °· Tuck your pant legs into your socks. °· If walking on a trail, stay in the middle of the trail to avoid brushing against bushes. °· Avoid walking through areas with long grass. °· Put bug spray on all skin that is showing and along boot tops, pant legs, and sleeve cuffs. °· Check clothes, hair, and skin often and before going inside. °· Brush off any ticks that are not attached. °· Take a shower or bath as soon as possible after being outdoors. °HOW SHOULD YOU REMOVE A TICK? °Ticks should be removed as soon as possible to help prevent diseases. °1. If latex gloves are available, put them on before trying to remove a tick. °2. Use tweezers to grasp the tick as close to the skin as possible. You may also use curved forceps or a tick removal tool. Grasp the tick as close to its head as possible. Avoid grasping the tick on its body. °3. Pull gently upward until the tick lets go. Do not twist the tick or jerk it suddenly. This may break off the tick's head or mouth parts. °4. Do not squeeze or crush the tick's body. This could force disease-carrying fluids from the tick into your body. °5. After the tick is removed, wash the bite area and your hands with soap and water or alcohol. °6. Apply a small amount of antiseptic cream or ointment to the bite site. °7. Wash any tools that were used. °Do not try to remove a tick by applying a hot match, petroleum jelly, or fingernail polish to the tick. These methods do  not work. They may also increase the chances of disease being spread from the tick bite. °WHEN SHOULD YOU SEEK HELP? °Contact your health care provider if you are unable to remove a tick or if a part of the tick breaks off in the skin. °After a tick bite, you need to watch for signs and symptoms of diseases that can be spread by ticks. Contact your health care provider if you develop any of the following: °· Fever. °· Rash. °· Redness and puffiness (swelling) in the area of the tick bite. °· Tender, puffy lymph glands. °· Watery poop (diarrhea). °· Weight loss. °· Cough. °· Feeling more tired than normal (fatigue). °· Muscle, joint, or bone pain. °· Belly (abdominal) pain. °· Headache. °· Change in your level of consciousness. °· Trouble walking or moving your legs. °· Loss of feeling (numbness) in the legs. °· Loss of movement (paralysis). °· Shortness of breath. °· Confusion. °· Throwing up (vomiting) many times. °  °This information is not intended to replace advice given to you by your health care provider. Make sure you discuss any questions you have with your health care provider. °  °Document Released: 09/23/2009 Document Revised: 03/01/2013 Document Reviewed: 12/07/2012 °Elsevier Interactive Patient Education ©2016 [employer] ° °

## 2015-12-02 NOTE — ED Notes (Addendum)
Pt here due to multiple tick bites. C/o mild joint pain.

## 2015-12-03 ENCOUNTER — Encounter (HOSPITAL_COMMUNITY): Payer: Self-pay

## 2015-12-03 ENCOUNTER — Ambulatory Visit (HOSPITAL_COMMUNITY): Payer: Medicare Other

## 2015-12-03 DIAGNOSIS — R29898 Other symptoms and signs involving the musculoskeletal system: Secondary | ICD-10-CM

## 2015-12-03 DIAGNOSIS — M25511 Pain in right shoulder: Secondary | ICD-10-CM | POA: Diagnosis not present

## 2015-12-03 DIAGNOSIS — M25611 Stiffness of right shoulder, not elsewhere classified: Secondary | ICD-10-CM

## 2015-12-03 NOTE — Therapy (Signed)
[place], [place], [address] Phone: [phone]   Fax:  [phone]  Occupational Therapy Treatment  Patient Details  Name: Scott Zamora MRN: [id number] Date of Birth: 03-Jan-1935 Referring Provider: Justice Britain, MD  Encounter Date: 12/03/2015      OT End of Session - 12/03/15 1113    Visit Number 22   Number of Visits 24   Date for OT Re-Evaluation 12/22/15   Authorization Type 1) Medicare 2) [facility] 3)[employer]   Authorization Time Period before 29th visit   Authorization - Visit Number 21   Authorization - Number of Visits 29   OT Start Time 1030   OT Stop Time 1115   OT Time Calculation (min) 45 min   Activity Tolerance Patient tolerated treatment well   Behavior During Therapy [facility] for tasks assessed/performed      Past Medical History  Diagnosis Date  . Hypertension   . Vertigo     hx of  . Sleep apnea     has CPAP but does not wear it  . Seasonal allergies   . Arthritis   . Dry skin   . Inguinal hernia, left   . GERD (gastroesophageal reflux disease)     Past Surgical History  Procedure Laterality Date  . Knee arthroscopy      left knee  . Colonoscopy w/ polypectomy    . Total shoulder arthroplasty  08/11/2012    Dr Justice Britain  . Total shoulder arthroplasty  08/11/2012    Procedure: TOTAL SHOULDER ARTHROPLASTY;  Surgeon: Marin Shutter, MD;  Location: [place];  Service: Orthopedics;  Laterality: Left;  . Inguinal hernia repair Left 04/18/2014    Procedure: HERNIA REPAIR INGUINAL ADULT;  Surgeon: Jamesetta So, MD;  Location[facility];  Service: General;  Laterality: Left;  . Insertion of mesh Left 04/18/2014    Procedure: INSERTION OF MESH;  Surgeon: Jamesetta So, MD;  Location: [facility];  Service: General;  Laterality: Left;  . Hernia repair    . Eye surgery Bilateral   . Total shoulder arthroplasty Right 09/12/2015    Procedure: RIGHT TOTAL SHOULDER ARTHROPLASTY;  Surgeon: Justice Britain, MD;   Location: [place];  Service: Orthopedics;  Laterality: Right;    There were no vitals filed for this visit.      Subjective Assessment - 12/03/15 1048    Subjective  S: My arm isn't sore. I can tell it's there but it's not pain.    Currently in Pain? No/denies            [facility] OT Assessment - 12/03/15 1049    Assessment   Diagnosis Right total shoulder arthroplasty   Precautions   Precautions Shoulder   Type of Shoulder Precautions Progress at tolerated                  OT Treatments/Exercises (OP) - 12/03/15 1049    Exercises   Exercises Shoulder   Shoulder Exercises: Supine   Protraction PROM;5 reps;Strengthening;12 reps   Protraction Weight (lbs) 1   Horizontal ABduction PROM;5 reps;Strengthening;12 reps   Horizontal ABduction Weight (lbs) 1   External Rotation PROM;5 reps;Strengthening;12 reps   External Rotation Weight (lbs) 1   Internal Rotation PROM;5 reps;Strengthening;12 reps   Internal Rotation Weight (lbs) 1   Flexion PROM;5 reps;Strengthening;12 reps   Shoulder Flexion Weight (lbs) 1   ABduction PROM;5 reps;Strengthening;12 reps   Shoulder ABduction Weight (lbs) 1   Shoulder Exercises:  Standing   Protraction Strengthening;10 reps   Protraction Weight (lbs) 1   Horizontal ABduction Strengthening;10 reps   Horizontal ABduction Weight (lbs) 1   External Rotation Strengthening;10 reps   External Rotation Weight (lbs) 1   Internal Rotation Strengthening;10 reps   Internal Rotation Weight (lbs) 1   Flexion Strengthening;10 reps   Shoulder Flexion Weight (lbs) 1   ABduction Strengthening;10 reps   Shoulder ABduction Weight (lbs) 1   Extension Theraband;10 reps   Theraband Level (Shoulder Extension) Level 3 (Green)   Row [employer] reps   Theraband Level (Shoulder Row) Level 3 (Green)   Retraction Theraband;10 reps   Theraband Level (Shoulder Retraction) Level 3 (Green)   Shoulder Exercises: ROM/Strengthening   UBE (Upper Arm Bike) Level 1 2'  forward 2' reverse   Cybex Press 2 plate;10 reps   [employer] Row 2 plate;10 reps   "W" Arms 10X   X to V Arms 10X   Proximal Shoulder Strengthening, Supine 12X each with 1# weight   Proximal Shoulder Strengthening, Seated 10X with 1# no rest breaks   Manual Therapy   Manual Therapy Myofascial release;Muscle Energy Technique   Manual therapy comments manual therapy completed prior to therapeutic exercise this date   Myofascial Release Myofascial release to right upper arm, trapezius, and scapularis regions to decrease pain and fascial restrictions and increase joint mobility   Muscle Energy Technique Muscle energry technique to right anterior deltoid to decrease muscle spasms and increase joint ROM.                   OT Short Term Goals - 11/26/15 1111    OT SHORT TERM GOAL #1   Title Patient will be educated and independent with HEP to increase functional use of RUE.   Time 4   Period Weeks   Status On-going   OT SHORT TERM GOAL #2   Title Patient will increase P/ROM of RUE to [facility] to increase ability to get shirts on and off.   Time 4   Period Weeks   Status On-going   OT SHORT TERM GOAL #3   Title Patient will increase RUE strength to 3/5 to increase ability to complete activities below shoulder level.    Time 4   Period Weeks   OT SHORT TERM GOAL #4   Title Patient will decrease pain level to 5/10 when using RUE to increase ability to use RUE for 50% of daily tasks.    Time 4   Period Weeks   OT SHORT TERM GOAL #5   Title Patient will decrease fascial restrictions to mod amount to increase functional mobility of RUE.    Time 4   Period Weeks           OT Long Term Goals - 11/26/15 1112    OT LONG TERM GOAL #1   Title Patient will return to highest level of independence using RUE for all daily and leisure tasks.    Time 8   Period Weeks   Status On-going   OT LONG TERM GOAL #2   Title Patient will increase RUE A/ROM to [facility] to increase ability to complete  tasks above shoulder.    Time 8   Period Weeks   Status On-going   OT LONG TERM GOAL #3   Title Patient will increase RUE strength to 4+/5 to increase ability to return to golf and bowling activities.    Time 8   Period Weeks   Status On-going  OT LONG TERM GOAL #4   Title Patient will decrease pain level in RUE during daily tasks to 3/10 or less.   Time 8   Period Weeks   OT LONG TERM GOAL #5   Title Patient will decrease fascial restrictions to min amount to increase functional mobility needed to return to leisure tasks.    Time 8   Period Weeks   Status On-going               Plan - 12/03/15 1114    Clinical Impression Statement A: Unable to add PVC pipe due to time constaint. Pt did better with form and technique during exercises although required intermittent cueing.    Plan P: Add PVC pipe activity.       Patient will benefit from skilled therapeutic intervention in order to improve the following deficits and impairments:  Decreased strength, Pain, Impaired UE functional use, Increased fascial restricitons, Decreased range of motion  Visit Diagnosis: Stiffness of right shoulder joint  Other symptoms and signs involving the musculoskeletal system    Problem List Patient Active Problem List   Diagnosis Date Noted  . S/P shoulder replacement 08/29/2012  . Pain in joint, shoulder region 08/29/2012  . Muscle weakness (generalized) 08/29/2012  . Arthritis of shoulder 08/12/2012    Ailene Ravel, OTR/L,CBIS  [phone]  12/03/2015, 11:16 AM  [place], [place], [address] Phone: [phone]   Fax:  [phone]  Name: Scott Zamora MRN: [id number] Date of Birth: 1935/05/14

## 2015-12-05 ENCOUNTER — Ambulatory Visit (HOSPITAL_COMMUNITY): Payer: Medicare Other

## 2015-12-05 ENCOUNTER — Encounter (HOSPITAL_COMMUNITY): Payer: Self-pay

## 2015-12-05 DIAGNOSIS — M25611 Stiffness of right shoulder, not elsewhere classified: Secondary | ICD-10-CM

## 2015-12-05 DIAGNOSIS — M25511 Pain in right shoulder: Secondary | ICD-10-CM | POA: Diagnosis not present

## 2015-12-05 DIAGNOSIS — R29898 Other symptoms and signs involving the musculoskeletal system: Secondary | ICD-10-CM | POA: Diagnosis not present

## 2015-12-05 NOTE — Therapy (Signed)
[place], [place], [address] Phone: [phone]   Fax:  [phone]  Occupational Therapy Treatment  Patient Details  Name: Scott Zamora MRN: [id number] Date of Birth: [birth year] Referring Provider: Justice Britain, MD  Encounter Date: 12/05/2015      OT End of Session - 12/05/15 1235    Visit Number 23   Number of Visits 24   Date for OT Re-Evaluation 12/22/15   Authorization Type 1) Medicare 2) [facility] 3)[employer]   Authorization Time Period before 29th visit   Authorization - Visit Number 64   Authorization - Number of Visits 29   OT Start Time 1030   OT Stop Time 1115   OT Time Calculation (min) 45 min   Activity Tolerance Patient tolerated treatment well   Behavior During Therapy [facility] for tasks assessed/performed      Past Medical History  Diagnosis Date  . Hypertension   . Vertigo     hx of  . Sleep apnea     has CPAP but does not wear it  . Seasonal allergies   . Arthritis   . Dry skin   . Inguinal hernia, left   . GERD (gastroesophageal reflux disease)     Past Surgical History  Procedure Laterality Date  . Knee arthroscopy      left knee  . Colonoscopy w/ polypectomy    . Total shoulder arthroplasty  08/11/2012    Dr Justice Britain  . Total shoulder arthroplasty  08/11/2012    Procedure: TOTAL SHOULDER ARTHROPLASTY;  Surgeon: Marin Shutter, MD;  Location: [place];  Service: Orthopedics;  Laterality: Left;  . Inguinal hernia repair Left 04/18/2014    Procedure: HERNIA REPAIR INGUINAL ADULT;  Surgeon: Jamesetta So, MD;  Location: [facility];  Service: General;  Laterality: Left;  . Insertion of mesh Left 04/18/2014    Procedure: INSERTION OF MESH;  Surgeon: Jamesetta So, MD;  Location: [facility];  Service: General;  Laterality: Left;  . Hernia repair    . Eye surgery Bilateral   . Total shoulder arthroplasty Right 09/12/2015    Procedure: RIGHT TOTAL SHOULDER ARTHROPLASTY;  Surgeon: Justice Britain, MD;   Location: [place];  Service: Orthopedics;  Laterality: Right;    There were no vitals filed for this visit.      Subjective Assessment - 12/05/15 1102    Subjective  S: I made the bed today and when I flipped the sheet over with my right arm I didn't feel any pain.    Currently in Pain? No/denies            [facility] OT Assessment - 12/05/15 1102    Assessment   Diagnosis Right total shoulder arthroplasty   Precautions   Precautions Shoulder   Type of Shoulder Precautions Progress at tolerated                  OT Treatments/Exercises (OP) - 12/05/15 1102    Exercises   Exercises Shoulder   Shoulder Exercises: Supine   Protraction PROM;5 reps;Strengthening;12 reps   Protraction Weight (lbs) 1   Horizontal ABduction PROM;5 reps;Strengthening;12 reps   Horizontal ABduction Weight (lbs) 1   External Rotation PROM;5 reps;Strengthening;12 reps   External Rotation Weight (lbs) 1   Internal Rotation PROM;5 reps;Strengthening;12 reps   Internal Rotation Weight (lbs) 1   Flexion PROM;5 reps;Strengthening;12 reps   Shoulder Flexion Weight (lbs) 1   ABduction PROM;5 reps;Strengthening;12 reps   Shoulder  ABduction Weight (lbs) 1   Shoulder Exercises: Standing   Protraction Strengthening;12 reps   Protraction Weight (lbs) 1   Horizontal ABduction Strengthening;10 reps   Horizontal ABduction Weight (lbs) 1   External Rotation Strengthening;15 reps   External Rotation Weight (lbs) 1   Internal Rotation Strengthening;12 reps   Internal Rotation Weight (lbs) 1   Flexion Strengthening;10 reps   Shoulder Flexion Weight (lbs) 1   ABduction Strengthening;10 reps   Shoulder ABduction Weight (lbs) 1   Extension Theraband;10 reps   Theraband Level (Shoulder Extension) Level 3 (Green)   Row [employer] reps   Theraband Level (Shoulder Row) Level 3 (Green)   Retraction Theraband;10 reps   Theraband Level (Shoulder Retraction) Level 3 (Green)   Shoulder Exercises:  ROM/Strengthening   UBE (Upper Arm Bike) Level 1 2' forward 2' reverse   "W" Arms 10X   X to V Arms 10X   Proximal Shoulder Strengthening, Supine 12X each with 1# weight   Proximal Shoulder Strengthening, Seated 10X with 1# no rest breaks   Manual Therapy   Manual Therapy Myofascial release;Muscle Energy Technique   Manual therapy comments manual therapy completed prior to therapeutic exercise this date   Myofascial Release Myofascial release to right upper arm, trapezius, and scapularis regions to decrease pain and fascial restrictions and increase joint mobility   Muscle Energy Technique Muscle energry technique to right anterior deltoid to decrease muscle spasms and increase joint ROM.                   OT Short Term Goals - 11/26/15 1111    OT SHORT TERM GOAL #1   Title Patient will be educated and independent with HEP to increase functional use of RUE.   Time 4   Period Weeks   Status On-going   OT SHORT TERM GOAL #2   Title Patient will increase P/ROM of RUE to [facility] to increase ability to get shirts on and off.   Time 4   Period Weeks   Status On-going   OT SHORT TERM GOAL #3   Title Patient will increase RUE strength to 3/5 to increase ability to complete activities below shoulder level.    Time 4   Period Weeks   OT SHORT TERM GOAL #4   Title Patient will decrease pain level to 5/10 when using RUE to increase ability to use RUE for 50% of daily tasks.    Time 4   Period Weeks   OT SHORT TERM GOAL #5   Title Patient will decrease fascial restrictions to mod amount to increase functional mobility of RUE.    Time 4   Period Weeks           OT Long Term Goals - 11/26/15 1112    OT LONG TERM GOAL #1   Title Patient will return to highest level of independence using RUE for all daily and leisure tasks.    Time 8   Period Weeks   Status On-going   OT LONG TERM GOAL #2   Title Patient will increase RUE A/ROM to [facility] to increase ability to complete tasks above  shoulder.    Time 8   Period Weeks   Status On-going   OT LONG TERM GOAL #3   Title Patient will increase RUE strength to 4+/5 to increase ability to return to golf and bowling activities.    Time 8   Period Weeks   Status On-going   OT LONG TERM GOAL #4  Title Patient will decrease pain level in RUE during daily tasks to 3/10 or less.   Time 8   Period Weeks   OT LONG TERM GOAL #5   Title Patient will decrease fascial restrictions to min amount to increase functional mobility needed to return to leisure tasks.    Time 8   Period Weeks   Status On-going               Plan - 12/05/15 1236    Clinical Impression Statement A: Increased repetitions to supine strengthening exercises. Patient required intermittent VC for form and technique. Rest breaks taken when needed.    Plan P: Add PVC pipe activity.       Patient will benefit from skilled therapeutic intervention in order to improve the following deficits and impairments:  Decreased strength, Pain, Impaired UE functional use, Increased fascial restricitons, Decreased range of motion  Visit Diagnosis: Stiffness of right shoulder joint  Other symptoms and signs involving the musculoskeletal system    Problem List Patient Active Problem List   Diagnosis Date Noted  . S/P shoulder replacement 08/29/2012  . Pain in joint, shoulder region 08/29/2012  . Muscle weakness (generalized) 08/29/2012  . Arthritis of shoulder 08/12/2012    Ailene Ravel, OTR/L,CBIS  [phone]  12/05/2015, 12:38 PM  [place], [place], [address] Phone: [phone]   Fax:  [phone]  Name: Scott Zamora MRN: [id number] Date of Birth: April 21, 1935

## 2015-12-10 ENCOUNTER — Ambulatory Visit (HOSPITAL_COMMUNITY): Payer: Medicare Other

## 2015-12-10 ENCOUNTER — Encounter (HOSPITAL_COMMUNITY): Payer: Self-pay

## 2015-12-10 DIAGNOSIS — R29898 Other symptoms and signs involving the musculoskeletal system: Secondary | ICD-10-CM

## 2015-12-10 DIAGNOSIS — M25611 Stiffness of right shoulder, not elsewhere classified: Secondary | ICD-10-CM | POA: Diagnosis not present

## 2015-12-10 DIAGNOSIS — M25511 Pain in right shoulder: Secondary | ICD-10-CM | POA: Diagnosis not present

## 2015-12-10 NOTE — Therapy (Signed)
[place], [place], [address] Phone: [phone]   Fax:  [phone]  Occupational Therapy Treatment  Patient Details  Name: Scott Zamora MRN: [id number] Date of Birth: [birth year] Referring Provider: Justice Britain, MD  Encounter Date: 12/10/2015      OT End of Session - 12/10/15 1028    Visit Number 24   Number of Visits 28   Date for OT Re-Evaluation 12/22/15   Authorization Type 1) Medicare 2) [facility] 3)[employer]   Authorization Time Period before 29th visit   Authorization - Visit Number 51   Authorization - Number of Visits 69   OT Start Time 0945   OT Stop Time 1030   OT Time Calculation (min) 45 min   Activity Tolerance Patient tolerated treatment well   Behavior During Therapy [facility] for tasks assessed/performed      Past Medical History  Diagnosis Date  . Hypertension   . Vertigo     hx of  . Sleep apnea     has CPAP but does not wear it  . Seasonal allergies   . Arthritis   . Dry skin   . Inguinal hernia, left   . GERD (gastroesophageal reflux disease)     Past Surgical History  Procedure Laterality Date  . Knee arthroscopy      left knee  . Colonoscopy w/ polypectomy    . Total shoulder arthroplasty  08/11/2012    Dr Justice Britain  . Total shoulder arthroplasty  08/11/2012    Procedure: TOTAL SHOULDER ARTHROPLASTY;  Surgeon: Marin Shutter, MD;  Location: [place];  Service: Orthopedics;  Laterality: Left;  . Inguinal hernia repair Left 04/18/2014    Procedure: HERNIA REPAIR INGUINAL ADULT;  Surgeon: Jamesetta So, MD;  [facility]: [facility];  Service: General;  Laterality: Left;  . Insertion of mesh Left 04/18/2014    Procedure: INSERTION OF MESH;  Surgeon: Jamesetta So, MD;  Location: [facility];  Service: General;  Laterality: Left;  . Hernia repair    . Eye surgery Bilateral   . Total shoulder arthroplasty Right 09/12/2015    Procedure: RIGHT TOTAL SHOULDER ARTHROPLASTY;  Surgeon: Justice Britain, MD;   Location: [place];  Service: Orthopedics;  Laterality: Right;    There were no vitals filed for this visit.      Subjective Assessment - 12/10/15 1005    Subjective  S: No issues today.   Currently in Pain? No/denies            [facility] OT Assessment - 12/10/15 1005    Assessment   Diagnosis Right total shoulder arthroplasty   Precautions   Precautions Shoulder   Type of Shoulder Precautions Progress at tolerated                  OT Treatments/Exercises (OP) - 12/10/15 1006    Exercises   Exercises Shoulder   Shoulder Exercises: Supine   Protraction PROM;5 reps;Strengthening;15 reps   Protraction Weight (lbs) 1   Horizontal ABduction PROM;5 reps;Strengthening;15 reps   Horizontal ABduction Weight (lbs) 1   External Rotation PROM;5 reps;Strengthening;15 reps   External Rotation Weight (lbs) 1   Internal Rotation PROM;5 reps;Strengthening;15 reps   Internal Rotation Weight (lbs) 1   Flexion PROM;5 reps;Strengthening;15 reps   Shoulder Flexion Weight (lbs) 1   ABduction PROM;5 reps;Strengthening;15 reps   Shoulder ABduction Weight (lbs) 1   Shoulder Exercises: Standing   Protraction Strengthening;12 reps   Protraction Weight (lbs)  1   Horizontal ABduction Strengthening;12 reps   Horizontal ABduction Weight (lbs) 1   External Rotation Strengthening;12 reps   External Rotation Limitations 1   Internal Rotation Strengthening;12 reps   Internal Rotation Weight (lbs) 1   Flexion Strengthening;12 reps   Shoulder Flexion Weight (lbs) 1   ABduction Strengthening;12 reps   Shoulder ABduction Weight (lbs) 1   Shoulder Exercises: ROM/Strengthening   UBE (Upper Arm Bike) Level 2 2' forward 2' reverse   "W" Arms 12X with 1#   X to V Arms 5X with1#   Proximal Shoulder Strengthening, Supine 12X each with 1# weight   Proximal Shoulder Strengthening, Seated 12X with 1# no rest breaks   Manual Therapy   Manual Therapy Myofascial release   Manual therapy comments manual  therapy completed prior to therapeutic exercise this date   Myofascial Release Myofascial release to right upper arm, trapezius, and scapularis regions to decrease pain and fascial restrictions and increase joint mobility                  OT Short Term Goals - 11/26/15 1111    OT SHORT TERM GOAL #1   Title Patient will be educated and independent with HEP to increase functional use of RUE.   Time 4   Period Weeks   Status On-going   OT SHORT TERM GOAL #2   Title Patient will increase P/ROM of RUE to [facility] to increase ability to get shirts on and off.   Time 4   Period Weeks   Status On-going   OT SHORT TERM GOAL #3   Title Patient will increase RUE strength to 3/5 to increase ability to complete activities below shoulder level.    Time 4   Period Weeks   OT SHORT TERM GOAL #4   Title Patient will decrease pain level to 5/10 when using RUE to increase ability to use RUE for 50% of daily tasks.    Time 4   Period Weeks   OT SHORT TERM GOAL #5   Title Patient will decrease fascial restrictions to mod amount to increase functional mobility of RUE.    Time 4   Period Weeks           OT Long Term Goals - 11/26/15 1112    OT LONG TERM GOAL #1   Title Patient will return to highest level of independence using RUE for all daily and leisure tasks.    Time 8   Period Weeks   Status On-going   OT LONG TERM GOAL #2   Title Patient will increase RUE A/ROM to [facility] to increase ability to complete tasks above shoulder.    Time 8   Period Weeks   Status On-going   OT LONG TERM GOAL #3   Title Patient will increase RUE strength to 4+/5 to increase ability to return to golf and bowling activities.    Time 8   Period Weeks   Status On-going   OT LONG TERM GOAL #4   Title Patient will decrease pain level in RUE during daily tasks to 3/10 or less.   Time 8   Period Weeks   OT LONG TERM GOAL #5   Title Patient will decrease fascial restrictions to min amount to increase  functional mobility needed to return to leisure tasks.    Time 8   Period Weeks   Status On-going               Plan -  12/10/15 1053    Clinical Impression Statement A: Complete W arms and X to V arms with weight this session. VC for form and technique. Increased to level 2 on UBE bike as well.    Plan P: Add PVC pipe slide.       Patient will benefit from skilled therapeutic intervention in order to improve the following deficits and impairments:  Decreased strength, Pain, Impaired UE functional use, Increased fascial restricitons, Decreased range of motion  Visit Diagnosis: Stiffness of right shoulder joint  Other symptoms and signs involving the musculoskeletal system    Problem List Patient Active Problem List   Diagnosis Date Noted  . S/P shoulder replacement 08/29/2012  . Pain in joint, shoulder region 08/29/2012  . Muscle weakness (generalized) 08/29/2012  . Arthritis of shoulder 08/12/2012    Ailene Ravel, OTR/L,CBIS  [phone]  12/10/2015, 10:56 AM  [place] [address] [place], [place], [address] Phone: [phone]   Fax:  [phone]  Name: Scott Zamora MRN: [id number] Date of Birth: 11-Aug-1934

## 2015-12-12 ENCOUNTER — Ambulatory Visit (HOSPITAL_COMMUNITY): Payer: Medicare Other | Attending: Orthopedic Surgery

## 2015-12-12 ENCOUNTER — Encounter (HOSPITAL_COMMUNITY): Payer: Self-pay

## 2015-12-12 DIAGNOSIS — R29898 Other symptoms and signs involving the musculoskeletal system: Secondary | ICD-10-CM | POA: Insufficient documentation

## 2015-12-12 DIAGNOSIS — M25611 Stiffness of right shoulder, not elsewhere classified: Secondary | ICD-10-CM | POA: Diagnosis not present

## 2015-12-12 DIAGNOSIS — M25511 Pain in right shoulder: Secondary | ICD-10-CM

## 2015-12-12 NOTE — Therapy (Signed)
[place], [place], [address] Phone: [phone]   Fax:  [phone]  Occupational Therapy Treatment  Patient Details  Name: BAKR GOLDIE MRN: [id number] Date of Birth: 1934/09/28 Referring Provider: Justice Britain, MD  Encounter Date: 12/12/2015      OT End of Session - 12/12/15 1030    Visit Number 25   Number of Visits 28   Date for OT Re-Evaluation 12/22/15   Authorization Type 1) Medicare 2) [facility] 3)[employer]   Authorization Time Period before 29th visit   Authorization - Visit Number 23   Authorization - Number of Visits 29   OT Start Time [phone]   OT Stop Time 1031   OT Time Calculation (min) 41 min   Activity Tolerance Patient tolerated treatment well   Behavior During Therapy [facility] for tasks assessed/performed      Past Medical History  Diagnosis Date  . Hypertension   . Vertigo     hx of  . Sleep apnea     has CPAP but does not wear it  . Seasonal allergies   . Arthritis   . Dry skin   . Inguinal hernia, left   . GERD (gastroesophageal reflux disease)     Past Surgical History  Procedure Laterality Date  . Knee arthroscopy      left knee  . Colonoscopy w/ polypectomy    . Total shoulder arthroplasty  08/11/2012    Dr Justice Britain  . Total shoulder arthroplasty  08/11/2012    Procedure: TOTAL SHOULDER ARTHROPLASTY;  Surgeon: Marin Shutter, MD;  Location: [facility];  Service: Orthopedics;  Laterality: Left;  . Inguinal hernia repair Left 04/18/2014    Procedure: HERNIA REPAIR INGUINAL ADULT;  Surgeon: Jamesetta So, MD;  Location: [facility];  Service: General;  Laterality: Left;  . Insertion of mesh Left 04/18/2014    Procedure: INSERTION OF MESH;  Surgeon: Jamesetta So, MD;  Location: [facility];  Service: General;  Laterality: Left;  . Hernia repair    . Eye surgery Bilateral   . Total shoulder arthroplasty Right 09/12/2015    Procedure: RIGHT TOTAL SHOULDER ARTHROPLASTY;  Surgeon: Justice Britain, MD;   Location: [facility];  Service: Orthopedics;  Laterality: Right;    There were no vitals filed for this visit.      Subjective Assessment - 12/12/15 1009    Subjective  S: I woke up and both my arms were sore.    Currently in Pain? Yes   Pain Score 3    Pain Location Shoulder   Pain Orientation Right   Pain Descriptors / Indicators Discomfort   Pain Type Acute pain   Pain Radiating Towards n/a   Pain Onset Today   Pain Frequency Occasional   Aggravating Factors  nothing   Pain Relieving Factors rest   Effect of Pain on Daily Activities None            [facility] OT Assessment - 12/12/15 1011    Assessment   Diagnosis Right total shoulder arthroplasty   Precautions   Precautions Shoulder   Type of Shoulder Precautions Progress at tolerated                  OT Treatments/Exercises (OP) - 12/12/15 1011    Exercises   Exercises Shoulder   Shoulder Exercises: Supine   Protraction PROM;5 reps;Strengthening;15 reps   Protraction Weight (lbs) 1   Horizontal ABduction PROM;5 reps;Strengthening;15 reps   Horizontal  ABduction Weight (lbs) 1   External Rotation PROM;5 reps;Strengthening;15 reps   External Rotation Weight (lbs) 1   Internal Rotation PROM;5 reps;Strengthening;15 reps   Internal Rotation Weight (lbs) 1   Flexion PROM;5 reps;Strengthening;15 reps   Shoulder Flexion Weight (lbs) 1   ABduction PROM;5 reps;Strengthening;15 reps   Shoulder ABduction Weight (lbs) 1   Shoulder Exercises: Standing   Protraction Strengthening;12 reps   Protraction Weight (lbs) 1   Horizontal ABduction Strengthening;12 reps   Horizontal ABduction Weight (lbs) 1   External Rotation Strengthening;12 reps   External Rotation Weight (lbs) 1   Internal Rotation Strengthening;12 reps   Internal Rotation Weight (lbs) 1   Flexion Strengthening;12 reps   Shoulder Flexion Weight (lbs) 1   ABduction Strengthening;12 reps   Shoulder ABduction Weight (lbs) 1   Shoulder Exercises:  ROM/Strengthening   UBE (Upper Arm Bike) Level 2 2' forward 2' reverse   Cybex Press 2 plate;10 reps   Cybex Row 2 plate;10 reps   "W" Arms 12X with 1#   Proximal Shoulder Strengthening, Supine 15X each with 1# weight   Other ROM/Strengthening Exercises PVC pipe activity in corner; 10X   Manual Therapy   Manual Therapy Myofascial release   Manual therapy comments manual therapy completed prior to therapeutic exercise this date   Myofascial Release Myofascial release to right upper arm, trapezius, and scapularis regions to decrease pain and fascial restrictions and increase joint mobility                  OT Short Term Goals - 11/26/15 1111    OT SHORT TERM GOAL #1   Title Patient will be educated and independent with HEP to increase functional use of RUE.   Time 4   Period Weeks   Status On-going   OT SHORT TERM GOAL #2   Title Patient will increase P/ROM of RUE to [facility] to increase ability to get shirts on and off.   Time 4   Period Weeks   Status On-going   OT SHORT TERM GOAL #3   Title Patient will increase RUE strength to 3/5 to increase ability to complete activities below shoulder level.    Time 4   Period Weeks   OT SHORT TERM GOAL #4   Title Patient will decrease pain level to 5/10 when using RUE to increase ability to use RUE for 50% of daily tasks.    Time 4   Period Weeks   OT SHORT TERM GOAL #5   Title Patient will decrease fascial restrictions to mod amount to increase functional mobility of RUE.    Time 4   Period Weeks           OT Long Term Goals - 11/26/15 1112    OT LONG TERM GOAL #1   Title Patient will return to highest level of independence using RUE for all daily and leisure tasks.    Time 8   Period Weeks   Status On-going   OT LONG TERM GOAL #2   Title Patient will increase RUE A/ROM to [facility] to increase ability to complete tasks above shoulder.    Time 8   Period Weeks   Status On-going   OT LONG TERM GOAL #3   Title Patient will  increase RUE strength to 4+/5 to increase ability to return to golf and bowling activities.    Time 8   Period Weeks   Status On-going   OT LONG TERM GOAL #4   Title  Patient will decrease pain level in RUE during daily tasks to 3/10 or less.   Time 8   Period Weeks   OT LONG TERM GOAL #5   Title Patient will decrease fascial restrictions to min amount to increase functional mobility needed to return to leisure tasks.    Time 8   Period Weeks   Status On-going               Plan - 12/12/15 1031    Clinical Impression Statement A: Added pvc piping this session prior to standing strengthening exercises. Pt reports that doing PVC activity really helped to stretch out RUE. Pt unable to complete X to V arms this date due pain and fatigue from sleeping on arm (pt reports).    Plan P: Continue to work on strengthening RUE shoulder and scapula. Continue to work on increasing Passive and active ROM of RUE as able.       Patient will benefit from skilled therapeutic intervention in order to improve the following deficits and impairments:  Decreased strength, Pain, Impaired UE functional use, Increased fascial restricitons, Decreased range of motion  Visit Diagnosis: Stiffness of right shoulder joint  Other symptoms and signs involving the musculoskeletal system  Pain in right shoulder    Problem List Patient Active Problem List   Diagnosis Date Noted  . S/P shoulder replacement 08/29/2012  . Pain in joint, shoulder region 08/29/2012  . Muscle weakness (generalized) 08/29/2012  . Arthritis of shoulder 08/12/2012    Ailene Ravel, OTR/L,CBIS  [phone]  12/12/2015, 10:49 AM  [place], [place], [address] Phone: [phone]   Fax:  [phone]  Name: COLLIN CLARA MRN: [id number] Date of Birth: 14-Aug-1934

## 2015-12-16 ENCOUNTER — Telehealth (HOSPITAL_COMMUNITY): Payer: Self-pay

## 2015-12-16 NOTE — Telephone Encounter (Signed)
12/16/15 pt [facility] that [facility] and doesn't think he will back in time for therapy

## 2015-12-17 ENCOUNTER — Encounter (HOSPITAL_COMMUNITY): Payer: TRICARE For Life (TFL)

## 2015-12-18 DIAGNOSIS — N400 Enlarged prostate without lower urinary tract symptoms: Secondary | ICD-10-CM | POA: Diagnosis not present

## 2015-12-18 DIAGNOSIS — R5383 Other fatigue: Secondary | ICD-10-CM | POA: Diagnosis not present

## 2015-12-18 DIAGNOSIS — T148 Other injury of unspecified body region: Secondary | ICD-10-CM | POA: Diagnosis not present

## 2015-12-18 DIAGNOSIS — R490 Dysphonia: Secondary | ICD-10-CM | POA: Diagnosis not present

## 2015-12-18 DIAGNOSIS — I10 Essential (primary) hypertension: Secondary | ICD-10-CM | POA: Diagnosis not present

## 2015-12-19 ENCOUNTER — Encounter (HOSPITAL_COMMUNITY): Payer: Self-pay | Admitting: Occupational Therapy

## 2015-12-19 ENCOUNTER — Ambulatory Visit (HOSPITAL_COMMUNITY): Payer: Medicare Other | Admitting: Occupational Therapy

## 2015-12-19 DIAGNOSIS — M25611 Stiffness of right shoulder, not elsewhere classified: Secondary | ICD-10-CM | POA: Diagnosis not present

## 2015-12-19 DIAGNOSIS — R29898 Other symptoms and signs involving the musculoskeletal system: Secondary | ICD-10-CM | POA: Diagnosis not present

## 2015-12-19 DIAGNOSIS — M25511 Pain in right shoulder: Secondary | ICD-10-CM | POA: Diagnosis not present

## 2015-12-19 NOTE — Therapy (Signed)
[place], [place], [address] Phone: [phone]   Fax:  [phone]  Occupational Therapy Reassessment, Treatment, Discharge Summary  Patient Details  Name: Scott Zamora MRN: [id number] Date of Birth: 10-26-1934 Referring Provider: Justice Britain, MD  Encounter Date: 12/19/2015      OT End of Session - 12/19/15 1427    Visit Number 26   Number of Visits 28   Date for OT Re-Evaluation 12/22/15   Authorization Type 1) Medicare 2) [facility] 3)[employer]   Authorization Time Period before 29th visit   Authorization - Visit Number 24   Authorization - Number of Visits 29   OT Start Time 1346   OT Stop Time 1423   OT Time Calculation (min) 37 min   Activity Tolerance Patient tolerated treatment well   Behavior During Therapy [facility] for tasks assessed/performed      Past Medical History  Diagnosis Date  . Hypertension   . Vertigo     hx of  . Sleep apnea     has CPAP but does not wear it  . Seasonal allergies   . Arthritis   . Dry skin   . Inguinal hernia, left   . GERD (gastroesophageal reflux disease)     Past Surgical History  Procedure Laterality Date  . Knee arthroscopy      left knee  . Colonoscopy w/ polypectomy    . Total shoulder arthroplasty  08/11/2012    Dr Justice Britain  . Total shoulder arthroplasty  08/11/2012    Procedure: TOTAL SHOULDER ARTHROPLASTY;  Surgeon: Marin Shutter, MD;  Location: [facility];  Service: Orthopedics;  Laterality: Left;  . Inguinal hernia repair Left 04/18/2014    Procedure: HERNIA REPAIR INGUINAL ADULT;  Surgeon: Jamesetta So, MD;  Location: [facility];  Service: General;  Laterality: Left;  . Insertion of mesh Left 04/18/2014    Procedure: INSERTION OF MESH;  Surgeon: Jamesetta So, MD;  Location: [facility];  Service: General;  Laterality: Left;  . Hernia repair    . Eye surgery Bilateral   . Total shoulder arthroplasty Right 09/12/2015    Procedure: RIGHT TOTAL SHOULDER  ARTHROPLASTY;  Surgeon: Justice Britain, MD;  Location: [place];  Service: Orthopedics;  Laterality: Right;    There were no vitals filed for this visit.      Subjective Assessment - 12/19/15 1347    Subjective  S: I'm feeling fine today.    Special Tests FOTO score: 69/100 (31% impairment)   Currently in Pain? No/denies           [facility] OT Assessment - 12/19/15 1358    Assessment   Diagnosis Right total shoulder arthroplasty   Precautions   Precautions Shoulder   Type of Shoulder Precautions Progress at tolerated   Palpation   Palpation comment Min fascial restrictions in right upper arm ,trapezius, and scapularis region.    AROM   Overall AROM Comments Assessed supine, ER/IR adducted   AROM Assessment Site Shoulder   Right/Left Shoulder Right   Right Shoulder Flexion 140 Degrees  126 previous   Right Shoulder ABduction 140 Degrees  135 previous   Right Shoulder Internal Rotation 90 Degrees  85 previous   Right Shoulder External Rotation 50 Degrees  same as previous   PROM   Overall PROM Comments Assessed supine. IR/er adducted   PROM Assessment Site Shoulder   Right/Left Shoulder Right   Right Shoulder Flexion 148 Degrees  127 previous  Right Shoulder ABduction 146 Degrees  140 previous   Right Shoulder Internal Rotation 90 Degrees  same as previous   Right Shoulder External Rotation 70 Degrees  65 previous   Right Shoulder Horizontal ABduction --   Strength   Overall Strength Comments Assessed seated, ER/IR adducted   Strength Assessment Site Shoulder   Right/Left Shoulder Right   Right Shoulder Flexion 4/5  4-/5 previous   Right Shoulder ABduction 4/5  same as previous   Right Shoulder Internal Rotation 4+/5  4/5 previous   Right Shoulder External Rotation 4/5  same as previous                  OT Treatments/Exercises (OP) - 12/19/15 1348    Exercises   Exercises Shoulder   Shoulder Exercises: Supine   Protraction PROM;5 reps   Horizontal  ABduction PROM;5 reps   External Rotation PROM;5 reps   Internal Rotation PROM;5 reps   Flexion PROM;5 reps   ABduction PROM;5 reps   Shoulder Exercises: Standing   Extension Theraband;10 reps   Theraband Level (Shoulder Extension) Level 3 (Green)   Row [employer] reps   Theraband Level (Shoulder Row) Level 3 (Green)   Retraction Theraband;10 reps   Theraband Level (Shoulder Retraction) Level 3 (Green)   Other Standing Exercises Green theraband strengthening exercises: horizontal abduction/adduction, ER/IR, PNF patterns.    Shoulder Exercises: ROM/Strengthening   Ball on Wall 1' flexion   Manual Therapy   Manual Therapy Myofascial release   Manual therapy comments manual therapy completed prior to therapeutic exercise this date   Myofascial Release Myofascial release to right upper arm, trapezius, and scapularis regions to decrease pain and fascial restrictions and increase joint mobility               OT Education - 12/19/15 1427    Education provided Yes   Education Details green scapular theraband exercises, green theraband strengthening exercises.    Person(s) Educated Patient   Methods Explanation;Demonstration;Handout   Comprehension Verbalized understanding;Returned demonstration          OT Short Term Goals - 12/19/15 1402    OT SHORT TERM GOAL #1   Title Patient will be educated and independent with HEP to increase functional use of RUE.   Time 4   Period Weeks   Status Achieved   OT SHORT TERM GOAL #2   Title Patient will increase P/ROM of RUE to [facility] to increase ability to get shirts on and off.   Time 4   Period Weeks   Status Achieved   OT SHORT TERM GOAL #3   Title Patient will increase RUE strength to 3/5 to increase ability to complete activities below shoulder level.    Time 4   Period Weeks   OT SHORT TERM GOAL #4   Title Patient will decrease pain level to 5/10 when using RUE to increase ability to use RUE for 50% of daily tasks.    Time 4    Period Weeks   OT SHORT TERM GOAL #5   Title Patient will decrease fascial restrictions to mod amount to increase functional mobility of RUE.    Time 4   Period Weeks           OT Long Term Goals - 12/19/15 1402    OT LONG TERM GOAL #1   Title Patient will return to highest level of independence using RUE for all daily and leisure tasks.    Time 8   Period Weeks  Status Achieved   OT LONG TERM GOAL #2   Title Patient will increase RUE A/ROM to [facility] to increase ability to complete tasks above shoulder.    Time 8   Period Weeks   Status Achieved   OT LONG TERM GOAL #3   Title Patient will increase RUE strength to 4+/5 to increase ability to return to golf and bowling activities.    Time 8   Period Weeks   Status Partially Met   OT LONG TERM GOAL #4   Title Patient will decrease pain level in RUE during daily tasks to 3/10 or less.   Time 8   Period Weeks   Status Achieved   OT LONG TERM GOAL #5   Title Patient will decrease fascial restrictions to min amount to increase functional mobility needed to return to leisure tasks.    Time 8   Period Weeks   Status Achieved               Plan - Jan 01, 2016 1428    Clinical Impression Statement A: Reassessment completed this session, pt has met all STGs, 4/5 LTGs, and partially met 1/5 LTGs. Pt reports he is now able to complete all daily and leisure activities independently and frequently finds himself completing tasks with his RUE without thinking about them. Pt has made improvements in pain, fascial restrictions, range of motion, and strength of RUE during therapy sessions, increasing overall functioning of RUE. Pt provided with updated HEP, is agreeable to discharge.    Rehab Potential Excellent   OT Frequency 2x / week   OT Duration 4 weeks   OT Treatment/Interventions Self-care/ADL training;Ultrasound;DME and/or AE instruction;Scar mobilization;Iontophoresis;Passive range of motion;Patient/family  education;Cryotherapy;Electrical Stimulation;Moist Heat;Therapeutic activities;Therapeutic exercises;Manual Therapy   Plan P: Discharge pt   OT Home Exercise Plan green scapular theraband, green theraband strengthening exercises   Consulted and Agree with Plan of Care Patient      Patient will benefit from skilled therapeutic intervention in order to improve the following deficits and impairments:  Decreased strength, Pain, Impaired UE functional use, Increased fascial restricitons, Decreased range of motion  Visit Diagnosis: Stiffness of right shoulder joint  Other symptoms and signs involving the musculoskeletal system      G-Codes - 01-01-2016 1445    Functional Assessment Tool Used FOTO Score: 69/100 (31% impairment)   Functional Limitation Carrying, moving and handling objects   Carrying, Moving and Handling Objects Goal Status ([id number]) At least 20 percent but less than 40 percent impaired, limited or restricted   Carrying, Moving and Handling Objects Discharge Status [phone]) At least 20 percent but less than 40 percent impaired, limited or restricted      Problem List Patient Active Problem List   Diagnosis Date Noted  . S/P shoulder replacement 08/29/2012  . Pain in joint, shoulder region 08/29/2012  . Muscle weakness (generalized) 08/29/2012  . Arthritis of shoulder 08/12/2012    Guadelupe Sabin, OTR/L  [phone]  2016/01/01, 2:46 PM  [place], [place], [address] Phone: [phone]   Fax:  [phone]  Name: Scott Zamora MRN: [id number] Date of Birth: 11-27-1934  OCCUPATIONAL THERAPY DISCHARGE SUMMARY  Visits from Start of Care: 26  Current functional level related to goals / functional outcomes: See above. Pt reports he is able to complete all daily and leisure tasks independently. Pt reports he is now completing tasks without thinking about them and with little to no pain.    Remaining  deficits: Pt reports he has occasional pains when using the arm frequently.    Education / Equipment: Provided pt with green scapular theraband and green theraband strengthening exercises. Reviewed current HEP.   Plan: Patient agrees to discharge.  Patient goals were met. Patient is being discharged due to meeting the stated rehab goals.  ?????

## 2015-12-19 NOTE — Patient Instructions (Signed)
Strengthening: Chest Pull - Resisted   Hold Theraband in front of body with hands about shoulder width a part. Pull band a part and back together slowly. Repeat __10__ times. Complete __1__ set(s) per session.. Repeat _1-2___ session(s) per day.  http://orth.exer.us/926   Copyright  VHI. All rights reserved.   PNF Strengthening: Resisted   Standing with resistive band around each hand, bring right arm up and away, thumb back. Repeat __10__ times per set. Do _1___ sets per session. Do __1-2__ sessions per day.                           Resisted External Rotation: in Neutral - Bilateral   Sit or stand, tubing in both hands, elbows at sides, bent to 90, forearms forward. Pinch shoulder blades together and rotate forearms out. Keep elbows at sides. Repeat __10__ times per set. Do __1__ sets per session. Do __1-2__ sessions per day.  http://orth.exer.us/966   Copyright  VHI. All rights reserved.   PNF Strengthening: Resisted   Standing, hold resistive band above head. Bring right arm down and out from side. Repeat _10___ times per set. Do __1__ sets per session. Do __1-2__ sessions per day.  http://orth.exer.us/922   Copyright  VHI. All rights reserved.        (Home) Extension: Isometric / Bilateral Arm Retraction - Sitting   Facing anchor, hold hands and elbow at shoulder height, with elbow bent.  Pull arms back to squeeze shoulder blades together. Repeat 10-15 times.  Copyright  VHI. All rights reserved.   (Home) Retraction: Row - Bilateral (Anchor)   Facing anchor, arms reaching forward, pull hands toward stomach, keeping elbows bent and at your sides and pinching shoulder blades together. Repeat 10-15 times.  Copyright  VHI. All rights reserved.   (Clinic) Extension / Flexion (Assist)   Face anchor, pull arms back, keeping elbow straight, and squeze shoulder blades together. Repeat 10-15 times.   Copyright  VHI. All rights  reserved.

## 2015-12-25 DIAGNOSIS — Z471 Aftercare following joint replacement surgery: Secondary | ICD-10-CM | POA: Diagnosis not present

## 2015-12-25 DIAGNOSIS — Z96611 Presence of right artificial shoulder joint: Secondary | ICD-10-CM | POA: Diagnosis not present

## 2016-04-02 DIAGNOSIS — M542 Cervicalgia: Secondary | ICD-10-CM | POA: Diagnosis not present

## 2016-04-02 DIAGNOSIS — R498 Other voice and resonance disorders: Secondary | ICD-10-CM | POA: Diagnosis not present

## 2016-04-02 DIAGNOSIS — I10 Essential (primary) hypertension: Secondary | ICD-10-CM | POA: Diagnosis not present

## 2016-04-02 DIAGNOSIS — Z23 Encounter for immunization: Secondary | ICD-10-CM | POA: Diagnosis not present

## 2016-04-02 DIAGNOSIS — K210 Gastro-esophageal reflux disease with esophagitis: Secondary | ICD-10-CM | POA: Diagnosis not present

## 2016-04-03 ENCOUNTER — Ambulatory Visit (HOSPITAL_COMMUNITY)
Admission: RE | Admit: 2016-04-03 | Discharge: 2016-04-03 | Disposition: A | Discharge status: Home / Self Care | Payer: Medicare Other | Source: Ambulatory Visit | Attending: Pulmonary Disease | Admitting: Pulmonary Disease

## 2016-04-03 ENCOUNTER — Other Ambulatory Visit (HOSPITAL_COMMUNITY): Payer: Self-pay | Admitting: Pulmonary Disease

## 2016-04-03 DIAGNOSIS — M4802 Spinal stenosis, cervical region: Secondary | ICD-10-CM | POA: Insufficient documentation

## 2016-04-03 DIAGNOSIS — I779 Disorder of arteries and arterioles, unspecified: Secondary | ICD-10-CM | POA: Insufficient documentation

## 2016-04-03 DIAGNOSIS — M47892 Other spondylosis, cervical region: Secondary | ICD-10-CM | POA: Diagnosis not present

## 2016-04-03 DIAGNOSIS — M542 Cervicalgia: Secondary | ICD-10-CM

## 2016-07-02 ENCOUNTER — Other Ambulatory Visit (HOSPITAL_COMMUNITY): Payer: Self-pay | Admitting: Pulmonary Disease

## 2016-07-02 DIAGNOSIS — K210 Gastro-esophageal reflux disease with esophagitis: Secondary | ICD-10-CM | POA: Diagnosis not present

## 2016-07-02 DIAGNOSIS — N401 Enlarged prostate with lower urinary tract symptoms: Secondary | ICD-10-CM | POA: Diagnosis not present

## 2016-07-02 DIAGNOSIS — M1990 Unspecified osteoarthritis, unspecified site: Secondary | ICD-10-CM | POA: Diagnosis not present

## 2016-07-02 DIAGNOSIS — I10 Essential (primary) hypertension: Secondary | ICD-10-CM | POA: Diagnosis not present

## 2016-07-02 DIAGNOSIS — M542 Cervicalgia: Secondary | ICD-10-CM

## 2016-07-10 ENCOUNTER — Ambulatory Visit (HOSPITAL_COMMUNITY)
Admission: RE | Admit: 2016-07-10 | Discharge: 2016-07-10 | Disposition: A | Discharge status: Home / Self Care | Payer: Medicare Other | Source: Ambulatory Visit | Attending: Pulmonary Disease | Admitting: Pulmonary Disease

## 2016-07-10 DIAGNOSIS — M47892 Other spondylosis, cervical region: Secondary | ICD-10-CM | POA: Insufficient documentation

## 2016-07-10 DIAGNOSIS — M4802 Spinal stenosis, cervical region: Secondary | ICD-10-CM | POA: Diagnosis not present

## 2016-07-10 DIAGNOSIS — M1288 Other specific arthropathies, not elsewhere classified, other specified site: Secondary | ICD-10-CM | POA: Insufficient documentation

## 2016-07-10 DIAGNOSIS — M47812 Spondylosis without myelopathy or radiculopathy, cervical region: Secondary | ICD-10-CM | POA: Diagnosis not present

## 2016-07-10 DIAGNOSIS — M542 Cervicalgia: Secondary | ICD-10-CM | POA: Diagnosis not present

## 2016-07-10 DIAGNOSIS — M8938 Hypertrophy of bone, other site: Secondary | ICD-10-CM | POA: Insufficient documentation

## 2016-07-22 ENCOUNTER — Ambulatory Visit (HOSPITAL_COMMUNITY): Payer: Medicare Other | Attending: Pulmonary Disease | Admitting: Physical Therapy

## 2016-07-22 DIAGNOSIS — M6281 Muscle weakness (generalized): Secondary | ICD-10-CM | POA: Diagnosis not present

## 2016-07-22 DIAGNOSIS — M25611 Stiffness of right shoulder, not elsewhere classified: Secondary | ICD-10-CM | POA: Insufficient documentation

## 2016-07-22 DIAGNOSIS — M542 Cervicalgia: Secondary | ICD-10-CM | POA: Insufficient documentation

## 2016-07-22 DIAGNOSIS — R29898 Other symptoms and signs involving the musculoskeletal system: Secondary | ICD-10-CM | POA: Insufficient documentation

## 2016-07-22 DIAGNOSIS — M62838 Other muscle spasm: Secondary | ICD-10-CM | POA: Diagnosis not present

## 2016-07-22 NOTE — Patient Instructions (Addendum)
Scapular Retraction (Standing)    With arms at sides, pinch shoulder blades together. Repeat __10__ times per set. Do __1__ sets per session. Do _2___ sessions per day.  http://orth.exer.us/944   Copyright  VHI. All rights reserved.  Flexibility: Neck Retraction    Pull head straight back, keeping eyes and jaw level. Repeat __10__ times per set. Do _1___ sets per session. Do _2___ sessions per day.  http://orth.exer.us/344   Copyright  VHI. All rights reserved.  Strengthening: Lateral Bend - Isometric (in Neutral)    Using light pressure from fingertips, press into right temple. Resist bending head sideways. Hold __5__ seconds. Repeat _10___ times per set. Do _1___ sets per session. Do __2__ sessions per day.  http://orth.exer.us/302   Copyright  VHI. All rights reserved.  Neck Retraction: Side-Bend    Sitting or standing, tuck chin and side-bend head toward left shoulder. Repeat ___5_ times per set. Do ___1_ sets per session. Do __2__ sessions per day.  http://orth.exer.us/386   Copyright  VHI. All rights reserved.

## 2016-07-22 NOTE — Therapy (Signed)
[place], [place], [address] Phone: [phone]   Fax:  [phone]  Physical Therapy Evaluation  Patient Details  Name: Scott Zamora MRN: [id number] Date of Birth: 16-Dec-1934 Referring Provider: Sinda Du  Encounter Date: 07/22/2016      PT End of Session - 07/22/16 1212    Visit Number 1   Number of Visits 8   Date for PT Re-Evaluation 08/21/16   Authorization Type medicare   Authorization - Visit Number 1   Authorization - Number of Visits 8   PT Start Time 1120   PT Stop Time [id number]   PT Time Calculation (min) 44 min   Activity Tolerance Patient tolerated treatment well   Behavior During Therapy [facility] for tasks assessed/performed      Past Medical History:  Diagnosis Date  . Arthritis   . Dry skin   . GERD (gastroesophageal reflux disease)   . Hypertension   . Inguinal hernia, left   . Seasonal allergies   . Sleep apnea    has CPAP but does not wear it  . Vertigo    hx of    Past Surgical History:  Procedure Laterality Date  . COLONOSCOPY W/ POLYPECTOMY    . EYE SURGERY Bilateral   . HERNIA REPAIR    . INGUINAL HERNIA REPAIR Left 04/18/2014   Procedure: HERNIA REPAIR INGUINAL ADULT;  Surgeon: Jamesetta So, MD;  [facility]: [facility];  Service: General;  Laterality: Left;  . INSERTION OF MESH Left 04/18/2014   Procedure: INSERTION OF MESH;  Surgeon: Jamesetta So, MD;  Location: [facility];  Service: General;  Laterality: Left;  . KNEE ARTHROSCOPY     left knee  . TOTAL SHOULDER ARTHROPLASTY  08/11/2012   Dr Justice Britain  . TOTAL SHOULDER ARTHROPLASTY  08/11/2012   Procedure: TOTAL SHOULDER ARTHROPLASTY;  Surgeon: Marin Shutter, MD;  Location: [place];  Service: Orthopedics;  Laterality: Left;  . TOTAL SHOULDER ARTHROPLASTY Right 09/12/2015   Procedure: RIGHT TOTAL SHOULDER ARTHROPLASTY;  Surgeon: Justice Britain, MD;  Location: [place];  Service: Orthopedics;  Laterality: Right;    There were no  vitals filed for this visit.       Subjective Assessment - 07/22/16 1118    Subjective [name] states that he has been having neck pain for about a year.  He had bilateral total shoulder replacements; the Right being the most recent approximently 11 months ago.  He had therapy on his shoulders from March to June of last year and feel that they are doing well.    He states that his neck has hurt on and off for the past year but  approximately 3 weeks ago he had a flare up that would not go away.  He had an MRI that showed significant OA.  He feels that sometimes the pain in his neck affects his swallowing.      Pertinent History B TSR, OA, HTN,    Patient Stated Goals to sleep well at night; has trouble sleeping and wakes up with pain    Currently in Pain? Yes   Pain Score 6    Pain Location Neck   Pain Orientation Right   Pain Descriptors / Indicators Aching;Discomfort;Spasm   Pain Type Chronic pain   Pain Radiating Towards to shoulder    Pain Onset More than a month ago   Pain Frequency Intermittent   Aggravating Factors  bowling,(lifting the ball).  Pain Relieving Factors exercise    Multiple Pain Sites No            [facility] PT Assessment - 07/22/16 0001      Assessment   Medical Diagnosis Cervical pain    Referring Provider Sinda Du   Onset Date/Surgical Date 06/12/16   Next MD Visit unknown   Prior Therapy not for this problem      Precautions   Precautions None     Restrictions   Weight Bearing Restrictions No     Balance Screen   Has the patient fallen in the past 6 months No   Has the patient had a decrease in activity level because of a fear of falling?  Yes   Is the patient reluctant to leave their home because of a fear of falling?  No     Home Ecologist residence     Prior Function   Level of Independence Independent   Leisure bowling      Cognition   Overall Cognitive Status Within Functional Limits for tasks  assessed     Observation/Other Assessments   Focus on Therapeutic Outcomes (FOTO)  59     Posture/Postural Control   Posture/Postural Control Postural limitations   Postural Limitations Rounded Shoulders;Forward head     ROM / Strength   AROM / PROM / Strength AROM;Strength     AROM   AROM Assessment Site Cervical   Cervical Flexion 40 reps no change in pain    Cervical Extension 50 reps no change in pain    Cervical - Right Side Bend 21 reps increase pain on the left.   Cervical - Left Side Bend 23 reps slight  increase of pain on right   Cervical - Right Rotation 35   Cervical - Left Rotation 58     Strength   Strength Assessment Site Cervical   Cervical Extension 5/5   Cervical - Right Side Bend 4/5   Cervical - Left Side Bend 4-/5     Palpation   Palpation comment moderate mm spasms                    [facility] Adult PT Treatment/Exercise - 07/22/16 0001      Exercises   Exercises Neck     Neck Exercises: Seated   Cervical Isometrics Right lateral flexion;Left lateral flexion;5 secs;5 reps   Neck Retraction 10 reps   Other Seated Exercise scapular retraction x 10                 PT Education - 07/22/16 1211    Education provided Yes   Education Details The importance of posture in neck care, HEP   Person(s) Educated Patient   Methods Explanation;Verbal cues;Handout   Comprehension Verbalized understanding;Returned demonstration          PT Short Term Goals - 07/22/16 1217      PT SHORT TERM GOAL #1   Title Pt to be able to verbalize the importance of posture in cervical health.   Time 1   Period Weeks   Status New     PT SHORT TERM GOAL #2   Title Pt cervical strength to be increased 1/2 grade to allow pain level to decrease to no greater than a 5/10 to allow pt to sleep waking only one time a night    Time 2   Period Weeks   Status New     PT SHORT TERM  GOAL #3   Title Pt Rt cervical rotation  to be improved by 10 degrees in  right rotation to improve safety in driving.    Time 2   Period Weeks   Status New           PT Long Term Goals - 07/22/16 1220      PT LONG TERM GOAL #1   Title Pt strength in cervical area to be 5/5 to allow pt to bowl without increased pain    Time 4   Period Weeks   Status New     PT LONG TERM GOAL #2   Title Pain level in cervical area to be no greater than a 2/10 to allow pt to sleep without waking up    Time 4   Period Weeks   Status New     PT LONG TERM GOAL #3   Title Pt right rotation to be improved by 15 degrees so that pt is able to see his blind side while driving.    Time 4   Period Weeks   Status New               Plan - 07/22/16 1213    Clinical Impression Statement Mr. Murano is a 82 yo male who has had intermittent neck pain for the past year that was exacerbated in December.  He has been referred to skilled physical therapy.  Examination demonstrates postural changes, decreased ROM, decreased strength; increased pain and mm spasm.  Mr. Corbeil will benefit from skilled physical therapy to address these issues and maximize his functional ability.    Rehab Potential Good   PT Frequency 2x / week   PT Duration 4 weeks   PT Treatment/Interventions ADLs/Self Care Home Management;Traction;Therapeutic activities;Therapeutic exercise;Patient/family education;Manual techniques   PT Next Visit Plan Continue with manual including manual traction, begin W back, chest stretch, shoulder shrugs going up back and relaxing with concentration on the relaxation.  Progress ROM   Consulted and Agree with Plan of Care Patient      Patient will benefit from skilled therapeutic intervention in order to improve the following deficits and impairments:  Decreased strength, Decreased range of motion, Postural dysfunction, Pain, Increased muscle spasms  Visit Diagnosis: Cervicalgia - Plan: PT plan of care cert/re-cert  Other muscle spasm - Plan: PT plan of care  cert/re-cert      G-Codes - 99991111 1221/10/12    Functional Limitation Changing and maintaining body position   Changing and Maintaining Body Position Current Status [id number]) At least 40 percent but less than 60 percent impaired, limited or restricted   Changing and Maintaining Body Position Goal Status [id number]) At least 1 percent but less than 20 percent impaired, limited or restricted       Problem List Patient Active Problem List   Diagnosis Date Noted  . S/P shoulder replacement 08/29/2012  . Pain in joint, shoulder region 08/29/2012  . Muscle weakness (generalized) 08/29/2012  . Arthritis of shoulder 08/12/2012    Rayetta Humphrey, PT CLT [phone] 07/22/2016, 12:27 PM  [place] [address] [place], [place], [address] Phone: [phone]   Fax:  [phone]  Name: ADORIAN CIRILO MRN: [id number] Date of Birth: May 04, 1935

## 2016-07-24 ENCOUNTER — Ambulatory Visit (HOSPITAL_COMMUNITY): Payer: Medicare Other

## 2016-07-24 DIAGNOSIS — M6281 Muscle weakness (generalized): Secondary | ICD-10-CM | POA: Diagnosis not present

## 2016-07-24 DIAGNOSIS — R29898 Other symptoms and signs involving the musculoskeletal system: Secondary | ICD-10-CM

## 2016-07-24 DIAGNOSIS — M25611 Stiffness of right shoulder, not elsewhere classified: Secondary | ICD-10-CM

## 2016-07-24 DIAGNOSIS — M62838 Other muscle spasm: Secondary | ICD-10-CM | POA: Diagnosis not present

## 2016-07-24 DIAGNOSIS — M542 Cervicalgia: Secondary | ICD-10-CM

## 2016-07-24 NOTE — Therapy (Signed)
[place], [place], [address] Phone: [phone]   Fax:  [phone]  Physical Therapy Treatment  Patient Details  Name: Scott Zamora MRN: [id number] Date of Birth: 08-13-1934 Referring Provider: Sinda Du  Encounter Date: 07/24/2016      PT End of Session - 07/24/16 0916    Visit Number 2   Number of Visits 8   Date for PT Re-Evaluation 08/21/16   Authorization Type medicare   Authorization - Visit Number 2   Authorization - Number of Visits 8   PT Start Time 0907   PT Stop Time 0945   PT Time Calculation (min) 38 min   Activity Tolerance Patient tolerated treatment well   Behavior During Therapy [facility] for tasks assessed/performed      Past Medical History:  Diagnosis Date  . Arthritis   . Dry skin   . GERD (gastroesophageal reflux disease)   . Hypertension   . Inguinal hernia, left   . Seasonal allergies   . Sleep apnea    has CPAP but does not wear it  . Vertigo    hx of    Past Surgical History:  Procedure Laterality Date  . COLONOSCOPY W/ POLYPECTOMY    . EYE SURGERY Bilateral   . HERNIA REPAIR    . INGUINAL HERNIA REPAIR Left 04/18/2014   Procedure: HERNIA REPAIR INGUINAL ADULT;  Surgeon: Jamesetta So, MD;  [facility];  Service: General;  Laterality: Left;  . INSERTION OF MESH Left 04/18/2014   Procedure: INSERTION OF MESH;  Surgeon: Jamesetta So, MD;  Location: [facility];  Service: General;  Laterality: Left;  . KNEE ARTHROSCOPY     left knee  . TOTAL SHOULDER ARTHROPLASTY  08/11/2012   Dr Justice Britain  . TOTAL SHOULDER ARTHROPLASTY  08/11/2012   Procedure: TOTAL SHOULDER ARTHROPLASTY;  Surgeon: Marin Shutter, MD;  Location: [place];  Service: Orthopedics;  Laterality: Left;  . TOTAL SHOULDER ARTHROPLASTY Right 09/12/2015   Procedure: RIGHT TOTAL SHOULDER ARTHROPLASTY;  Surgeon: Justice Britain, MD;  Location: [place];  Service: Orthopedics;  Laterality: Right;    There were no  vitals filed for this visit.      Subjective Assessment - 07/24/16 0910    Subjective Pt stated he has tried the HEP and reports lower back is stiff today, unsure if correct form/technique wiht HEP.  No reports of pain in shoulders today.     Pertinent History B TSR, OA, HTN,    Patient Stated Goals to sleep well at night; has trouble sleeping and wakes up with pain    Currently in Pain? No/denies   Pain Location Back   Pain Descriptors / Indicators --  Stiffness in lower back                         [facility] Adult PT Treatment/Exercise - 07/24/16 0001      Neck Exercises: Seated   Cervical Isometrics Right lateral flexion;Left lateral flexion;5 secs;5 reps   Neck Retraction 10 reps   Neck Retraction Limitations multimodal cueing    Cervical Rotation 10 reps   Lateral Flexion 10 reps   Lateral Flexion Limitations cueing for form   W Back Limitations 10x (cueing for form)   Shoulder Rolls Backwards;10 reps   Other Seated Exercise scapular retraction 2x 10      Manual Therapy   Manual Therapy Soft tissue mobilization;Manual Traction   Manual  therapy comments Manual therapy complete separate rest of tx   Soft tissue mobilization Cervical musculature to reduce tightness (UT, levator and scapular mm)   Manual Traction Manual traction 3x 120"     Neck Exercises: Stretches   Corner Stretch 3 reps;30 seconds                  PT Short Term Goals - 07/22/16 1217      PT SHORT TERM GOAL #1   Title Pt to be able to verbalize the importance of posture in cervical health.   Time 1   Period Weeks   Status New     PT SHORT TERM GOAL #2   Title Pt cervical strength to be increased 1/2 grade to allow pain level to decrease to no greater than a 5/10 to allow pt to sleep waking only one time a night    Time 2   Period Weeks   Status New     PT SHORT TERM GOAL #3   Title Pt Rt cervical rotation  to be improved by 10 degrees in right rotation to improve safety  in driving.    Time 2   Period Weeks   Status New           PT Long Term Goals - 07/22/16 1220      PT LONG TERM GOAL #1   Title Pt strength in cervical area to be 5/5 to allow pt to bowl without increased pain    Time 4   Period Weeks   Status New     PT LONG TERM GOAL #2   Title Pain level in cervical area to be no greater than a 2/10 to allow pt to sleep without waking up    Time 4   Period Weeks   Status New     PT LONG TERM GOAL #3   Title Pt right rotation to be improved by 15 degrees so that pt is able to see his blind side while driving.    Time 4   Period Weeks   Status New               Plan - 07/24/16 0947    Clinical Impression Statement Reviewed goals, assured compliance and educated with proper form and technquie with HEP and copy of eval given to pt.  Therapist facilitation with verbal, visual and tactile cueing for proper technique and to relax upper traps wiht scapular and cervical retraction.  Pt encouraged to complete HEP infront of mirror to improve form.  EOS with manual soft tissue mobilitization to cervical musculature and manual traction.  No reports of increased pain through session.     Rehab Potential Good   PT Frequency 2x / week   PT Duration 4 weeks   PT Treatment/Interventions ADLs/Self Care Home Management;Traction;Therapeutic activities;Therapeutic exercise;Patient/family education;Manual techniques   PT Next Visit Plan Continue with current POC to improve form with therex including cervical/scapular retraction, W back, chest stretch, shoulder shrugs going up back and relaxing with concentration on the relaxation.  Progress ROM.  Manual including manual traction.      Patient will benefit from skilled therapeutic intervention in order to improve the following deficits and impairments:  Decreased strength, Decreased range of motion, Postural dysfunction, Pain, Increased muscle spasms  Visit Diagnosis: Cervicalgia  Other muscle  spasm  Stiffness of right shoulder joint  Other symptoms and signs involving the musculoskeletal system     Problem List Patient Active Problem List  Diagnosis Date Noted  . S/P shoulder replacement 08/29/2012  . Pain in joint, shoulder region 08/29/2012  . Muscle weakness (generalized) 08/29/2012  . Arthritis of shoulder 08/12/2012   Ihor Austin, [place]; [place]  Aldona Lento 07/24/2016, 12:33 PM  [place], [place], [address] Phone: [phone]   Fax:  [phone]  Name: TERRICK DANKERS MRN: [id number] Date of Birth: 10-03-1934

## 2016-07-29 ENCOUNTER — Ambulatory Visit (HOSPITAL_COMMUNITY): Payer: Medicare Other

## 2016-07-31 ENCOUNTER — Ambulatory Visit (HOSPITAL_COMMUNITY): Payer: Medicare Other

## 2016-07-31 ENCOUNTER — Encounter (HOSPITAL_COMMUNITY): Payer: Medicare Other

## 2016-07-31 DIAGNOSIS — M542 Cervicalgia: Secondary | ICD-10-CM | POA: Diagnosis not present

## 2016-07-31 DIAGNOSIS — M62838 Other muscle spasm: Secondary | ICD-10-CM | POA: Diagnosis not present

## 2016-07-31 DIAGNOSIS — M6281 Muscle weakness (generalized): Secondary | ICD-10-CM | POA: Diagnosis not present

## 2016-07-31 DIAGNOSIS — R29898 Other symptoms and signs involving the musculoskeletal system: Secondary | ICD-10-CM | POA: Diagnosis not present

## 2016-07-31 DIAGNOSIS — M25611 Stiffness of right shoulder, not elsewhere classified: Secondary | ICD-10-CM | POA: Diagnosis not present

## 2016-07-31 NOTE — Therapy (Signed)
[place], [place], [address] Phone: [phone]   Fax:  [phone]  Physical Therapy Treatment  Patient Details  Name: Scott Zamora MRN: [id number] Date of Birth: [birth year] Referring Provider: Sinda Du  Encounter Date: 07/31/2016      PT End of Session - 07/31/16 0834    Visit Number 3   Number of Visits 8   Date for PT Re-Evaluation 08/21/16   Authorization Type medicare   Authorization - Visit Number 3   Authorization - Number of Visits 8   PT Start Time 0823   PT Stop Time 0901   PT Time Calculation (min) 38 min   Activity Tolerance Patient tolerated treatment well   Behavior During Therapy [facility] for tasks assessed/performed      Past Medical History:  Diagnosis Date  . Arthritis   . Dry skin   . GERD (gastroesophageal reflux disease)   . Hypertension   . Inguinal hernia, left   . Seasonal allergies   . Sleep apnea    has CPAP but does not wear it  . Vertigo    hx of    Past Surgical History:  Procedure Laterality Date  . COLONOSCOPY W/ POLYPECTOMY    . EYE SURGERY Bilateral   . HERNIA REPAIR    . INGUINAL HERNIA REPAIR Left 04/18/2014   Procedure: HERNIA REPAIR INGUINAL ADULT;  Surgeon: Jamesetta So, MD;  [facility];  Service: General;  Laterality: Left;  . INSERTION OF MESH Left 04/18/2014   Procedure: INSERTION OF MESH;  Surgeon: Jamesetta So, MD;  Location: [facility];  Service: General;  Laterality: Left;  . KNEE ARTHROSCOPY     left knee  . TOTAL SHOULDER ARTHROPLASTY  08/11/2012   Dr Justice Britain  . TOTAL SHOULDER ARTHROPLASTY  08/11/2012   Procedure: TOTAL SHOULDER ARTHROPLASTY;  Surgeon: Marin Shutter, MD;  Location: [place];  Service: Orthopedics;  Laterality: Left;  . TOTAL SHOULDER ARTHROPLASTY Right 09/12/2015   Procedure: RIGHT TOTAL SHOULDER ARTHROPLASTY;  Surgeon: Justice Britain, MD;  Location: [place];  Service: Orthopedics;  Laterality: Right;    There were no  vitals filed for this visit.      Subjective Assessment - 07/31/16 0824    Subjective Pt stated he is feeling good, has been compliant with HEP daily and reports abiltiy to shovel snow for 20 minutes yesterday without difficulty   Pertinent History B TSR, OA, HTN,    Patient Stated Goals to sleep well at night; has trouble sleeping and wakes up with pain    Currently in Pain? No/denies                         [facility] Adult PT Treatment/Exercise - 07/31/16 0001      Neck Exercises: Standing   Neck Retraction 10 reps;5 secs   Neck Retraction Limitations cueing for form/tech   Upper Extremity Flexion with Stabilization Flexion;10 reps   UE Flexion with Stabilization Limitations infront of wall     Neck Exercises: Seated   Neck Retraction 10 reps   Neck Retraction Limitations multimodal cueing    Cervical Rotation 10 reps   W Back Limitations 10x (cueing for form)   Shoulder Rolls Backwards;10 reps   Other Seated Exercise scapular retraction 2x 10 with RTB     Manual Therapy   Manual Therapy Soft tissue mobilization;Manual Traction   Manual therapy comments Manual therapy complete separate  rest of tx   Soft tissue mobilization Cervical musculature to reduce tightness (UT, levator and scapular mm)   Manual Traction Manual traction 3sets     Neck Exercises: Stretches   Corner Stretch 3 reps;30 seconds                  PT Short Term Goals - 07/22/16 1217      PT SHORT TERM GOAL #1   Title Pt to be able to verbalize the importance of posture in cervical health.   Time 1   Period Weeks   Status New     PT SHORT TERM GOAL #2   Title Pt cervical strength to be increased 1/2 grade to allow pain level to decrease to no greater than a 5/10 to allow pt to sleep waking only one time a night    Time 2   Period Weeks   Status New     PT SHORT TERM GOAL #3   Title Pt Rt cervical rotation  to be improved by 10 degrees in right rotation to improve safety in  driving.    Time 2   Period Weeks   Status New           PT Long Term Goals - 07/22/16 1220      PT LONG TERM GOAL #1   Title Pt strength in cervical area to be 5/5 to allow pt to bowl without increased pain    Time 4   Period Weeks   Status New     PT LONG TERM GOAL #2   Title Pain level in cervical area to be no greater than a 2/10 to allow pt to sleep without waking up    Time 4   Period Weeks   Status New     PT LONG TERM GOAL #3   Title Pt right rotation to be improved by 15 degrees so that pt is able to see his blind side while driving.    Time 4   Period Weeks   Status New               Plan - 07/31/16 0901    Clinical Impression Statement Session focus on improving cervical mobility and posture strengthening.  Pt improving form with HEP exercises with minimal cueing required to relax shoulder elevation with cervical rotation and retraction, best with visual cueing.  Added posture strengthening exercises with good form following initial instructions.  Ended session with manual to reduce cervical tightness.  No reports of pain through session.  Did notice improved rotation at EOS.     Rehab Potential Good   PT Frequency 2x / week   PT Duration 4 weeks   PT Treatment/Interventions ADLs/Self Care Home Management;Traction;Therapeutic activities;Therapeutic exercise;Patient/family education;Manual techniques   PT Next Visit Plan Continue with current POC to improve form with therex including cervical/scapular retraction, W back, chest stretch, shoulder shrugs going up back and relaxing with concentration on the relaxation.  Progress ROM.  Manual including manual traction.      Patient will benefit from skilled therapeutic intervention in order to improve the following deficits and impairments:  Decreased strength, Decreased range of motion, Postural dysfunction, Pain, Increased muscle spasms  Visit Diagnosis: Cervicalgia  Other muscle spasm  Stiffness of  right shoulder joint  Other symptoms and signs involving the musculoskeletal system  Muscle weakness (generalized)     Problem List Patient Active Problem List   Diagnosis Date Noted  . S/P shoulder replacement 08/29/2012  .  Pain in joint, shoulder region 08/29/2012  . Muscle weakness (generalized) 08/29/2012  . Arthritis of shoulder 08/12/2012   Ihor Austin, LPTA; [place]  Aldona Lento 07/31/2016, 10:03 AM  [place], [place], [address] Phone: [phone]   Fax:  [phone]  Name: ABDALRAHMAN MANFORD MRN: [id number] Date of Birth: 09-Mar-1935

## 2016-08-05 ENCOUNTER — Ambulatory Visit (HOSPITAL_COMMUNITY): Payer: Medicare Other | Admitting: Physical Therapy

## 2016-08-05 DIAGNOSIS — M62838 Other muscle spasm: Secondary | ICD-10-CM

## 2016-08-05 DIAGNOSIS — M542 Cervicalgia: Secondary | ICD-10-CM

## 2016-08-05 DIAGNOSIS — M6281 Muscle weakness (generalized): Secondary | ICD-10-CM | POA: Diagnosis not present

## 2016-08-05 DIAGNOSIS — M25611 Stiffness of right shoulder, not elsewhere classified: Secondary | ICD-10-CM | POA: Diagnosis not present

## 2016-08-05 DIAGNOSIS — R29898 Other symptoms and signs involving the musculoskeletal system: Secondary | ICD-10-CM | POA: Diagnosis not present

## 2016-08-05 NOTE — Therapy (Signed)
[place], [place], [address] Phone: [phone]   Fax:  [phone]  Physical Therapy Treatment  Patient Details  Name: Scott Zamora MRN: [id number] Date of Birth: [birth year] Referring Provider: Sinda Du  Encounter Date: 08/05/2016      PT End of Session - 08/05/16 0923    Visit Number 4   Number of Visits 8   Date for PT Re-Evaluation 08/21/16   Authorization Type medicare   Authorization - Visit Number 4   Authorization - Number of Visits 8   PT Start Time 0900   PT Stop Time 0945   PT Time Calculation (min) 45 min   Activity Tolerance Patient tolerated treatment well   Behavior During Therapy [facility] for tasks assessed/performed      Past Medical History:  Diagnosis Date  . Arthritis   . Dry skin   . GERD (gastroesophageal reflux disease)   . Hypertension   . Inguinal hernia, left   . Seasonal allergies   . Sleep apnea    has CPAP but does not wear it  . Vertigo    hx of    Past Surgical History:  Procedure Laterality Date  . COLONOSCOPY W/ POLYPECTOMY    . EYE SURGERY Bilateral   . HERNIA REPAIR    . INGUINAL HERNIA REPAIR Left 04/18/2014   Procedure: HERNIA REPAIR INGUINAL ADULT;  Surgeon: Jamesetta So, MD;  [facility];  Service: General;  Laterality: Left;  . INSERTION OF MESH Left 04/18/2014   Procedure: INSERTION OF MESH;  Surgeon: Jamesetta So, MD;  [facility];  Service: General;  Laterality: Left;  . KNEE ARTHROSCOPY     left knee  . TOTAL SHOULDER ARTHROPLASTY  08/11/2012   Dr Justice Britain  . TOTAL SHOULDER ARTHROPLASTY  08/11/2012   Procedure: TOTAL SHOULDER ARTHROPLASTY;  Surgeon: Marin Shutter, MD;  Location: [place];  Service: Orthopedics;  Laterality: Left;  . TOTAL SHOULDER ARTHROPLASTY Right 09/12/2015   Procedure: RIGHT TOTAL SHOULDER ARTHROPLASTY;  Surgeon: Justice Britain, MD;  Location: [place];  Service: Orthopedics;  Laterality: Right;    There were no  vitals filed for this visit.      Subjective Assessment - 08/05/16 0859    Subjective Scott Zamora states that he had a difficulty week.  He is hurting more on his left side.    Pertinent History B TSR, OA, HTN,    Patient Stated Goals to sleep well at night; has trouble sleeping and wakes up with pain    Pain Score 4    Pain Location Neck   Pain Orientation Left   Pain Descriptors / Indicators Aching   Pain Radiating Towards Lt shoulder   Pain Onset More than a month ago                         [facility] Adult PT Treatment/Exercise - 08/05/16 0001      Exercises   Exercises Neck     Neck Exercises: Theraband   Scapula Retraction --  attempted but pt hikes shoulder up to much come back to    Other Theraband Exercises punch down on left x10      Neck Exercises: Standing   Neck Retraction 10 reps;5 secs     Neck Exercises: Prone   Shoulder Extension 10 reps     Manual Therapy   Manual Therapy Soft tissue mobilization;Manual Traction   Manual  therapy comments Manual therapy complete separate rest of tx   Soft tissue mobilization Cervical musculature to reduce tightness (UT, levator and scapular mm)   Manual Traction Manual traction 3sets     Neck Exercises: Stretches   Corner Stretch 3 reps;30 seconds   Chest Stretch 5 reps                  PT Short Term Goals - 07/22/16 1217      PT SHORT TERM GOAL #1   Title Pt to be able to verbalize the importance of posture in cervical health.   Time 1   Period Weeks   Status New     PT SHORT TERM GOAL #2   Title Pt cervical strength to be increased 1/2 grade to allow pain level to decrease to no greater than a 5/10 to allow pt to sleep waking only one time a night    Time 2   Period Weeks   Status New     PT SHORT TERM GOAL #3   Title Pt Rt cervical rotation  to be improved by 10 degrees in right rotation to improve safety in driving.    Time 2   Period Weeks   Status New           PT Long  Term Goals - 07/22/16 1220      PT LONG TERM GOAL #1   Title Pt strength in cervical area to be 5/5 to allow pt to bowl without increased pain    Time 4   Period Weeks   Status New     PT LONG TERM GOAL #2   Title Pain level in cervical area to be no greater than a 2/10 to allow pt to sleep without waking up    Time 4   Period Weeks   Status New     PT LONG TERM GOAL #3   Title Pt right rotation to be improved by 15 degrees so that pt is able to see his blind side while driving.    Time 4   Period Weeks   Status New               Plan - 08/05/16 0944    Clinical Impression Statement Pt engages upper trap musculature in almost all UE activity.  Session focused on depressing shoulders prior to exercise or functional strength.  Followed by manual to decrease pain.  PT pain level 0/10 at end of session.     Rehab Potential Good   PT Frequency 2x / week   PT Duration 4 weeks   PT Treatment/Interventions ADLs/Self Care Home Management;Traction;Therapeutic activities;Therapeutic exercise;Patient/family education;Manual techniques   PT Next Visit Plan Work on decreasing upper trap activity during exercises begin wall plank attempt postrual t-band activity when pt is able to complete with good form.       Patient will benefit from skilled therapeutic intervention in order to improve the following deficits and impairments:  Decreased strength, Decreased range of motion, Postural dysfunction, Pain, Increased muscle spasms  Visit Diagnosis: Cervicalgia  Other muscle spasm     Problem List Patient Active Problem List   Diagnosis Date Noted  . S/P shoulder replacement 08/29/2012  . Pain in joint, shoulder region 08/29/2012  . Muscle weakness (generalized) 08/29/2012  . Arthritis of shoulder 08/12/2012    Rayetta Humphrey, PT CLT [phone] 08/05/2016, 9:48 AM  [place], [place], [address] Phone:  [phone]   Fax:  [phone]  Name: Scott Zamora MRN: [id number] Date of Birth: 1935-06-05

## 2016-08-07 ENCOUNTER — Ambulatory Visit (HOSPITAL_COMMUNITY): Payer: Medicare Other | Admitting: Physical Therapy

## 2016-08-07 DIAGNOSIS — M25611 Stiffness of right shoulder, not elsewhere classified: Secondary | ICD-10-CM | POA: Diagnosis not present

## 2016-08-07 DIAGNOSIS — M6281 Muscle weakness (generalized): Secondary | ICD-10-CM | POA: Diagnosis not present

## 2016-08-07 DIAGNOSIS — R29898 Other symptoms and signs involving the musculoskeletal system: Secondary | ICD-10-CM | POA: Diagnosis not present

## 2016-08-07 DIAGNOSIS — M542 Cervicalgia: Secondary | ICD-10-CM

## 2016-08-07 DIAGNOSIS — M62838 Other muscle spasm: Secondary | ICD-10-CM | POA: Diagnosis not present

## 2016-08-07 NOTE — Therapy (Signed)
[name], [place], [address] Phone: [phone]   Fax:  [phone]  Physical Therapy Treatment  Patient Details  Name: Scott Zamora MRN: [id number] Date of Birth: 12-26-1934 Referring Provider: Sinda Du  Encounter Date: 08/07/2016      PT End of Session - 08/07/16 0921    Visit Number 5   Number of Visits 8   Date for PT Re-Evaluation 08/21/16   Authorization Type medicare   Authorization - Visit Number 5   Authorization - Number of Visits 8   PT Start Time 0904   PT Stop Time 0945   PT Time Calculation (min) 41 min   Activity Tolerance Patient tolerated treatment well   Behavior During Therapy [facility] for tasks assessed/performed      Past Medical History:  Diagnosis Date  . Arthritis   . Dry skin   . GERD (gastroesophageal reflux disease)   . Hypertension   . Inguinal hernia, left   . Seasonal allergies   . Sleep apnea    has CPAP but does not wear it  . Vertigo    hx of    Past Surgical History:  Procedure Laterality Date  . COLONOSCOPY W/ POLYPECTOMY    . EYE SURGERY Bilateral   . HERNIA REPAIR    . INGUINAL HERNIA REPAIR Left 04/18/2014   Procedure: HERNIA REPAIR INGUINAL ADULT;  Surgeon: Jamesetta So, MD;  [facility];  Service: General;  Laterality: Left;  . INSERTION OF MESH Left 04/18/2014   Procedure: INSERTION OF MESH;  Surgeon: Jamesetta So, MD;  [facility];  Service: General;  Laterality: Left;  . KNEE ARTHROSCOPY     left knee  . TOTAL SHOULDER ARTHROPLASTY  08/11/2012   Dr Justice Britain  . TOTAL SHOULDER ARTHROPLASTY  08/11/2012   Procedure: TOTAL SHOULDER ARTHROPLASTY;  Surgeon: Marin Shutter, MD;  Location: [place];  Service: Orthopedics;  Laterality: Left;  . TOTAL SHOULDER ARTHROPLASTY Right 09/12/2015   Procedure: RIGHT TOTAL SHOULDER ARTHROPLASTY;  Surgeon: Justice Britain, MD;  Location: [place];  Service: Orthopedics;  Laterality: Right;    There were no  vitals filed for this visit.      Subjective Assessment - 08/07/16 0906    Subjective Majority of the pain is on the left side,(pt points to uppper trapezius mm).   Currently in Pain? Yes   Pain Score 3    Pain Location Neck   Pain Orientation Left   Pain Descriptors / Indicators Aching;Burning   Pain Type Chronic pain   Pain Onset More than a month ago   Pain Frequency Intermittent   Aggravating Factors  stress   Pain Relieving Factors Pain patch                          [facility] Adult PT Treatment/Exercise - 08/07/16 0001      Neck Exercises: Theraband   Other Theraband Exercises punch down on left x10      Neck Exercises: Standing   Upper Extremity Flexion with Stabilization Flexion;10 reps   UE Flexion with Stabilization Limitations infront of wall   Other Standing Exercises against wall w to Y then pull off    Other Standing Exercises wall plank 3 x 30"     Neck Exercises: Seated   Cervical Isometrics Right lateral flexion;Left lateral flexion;5 secs;5 reps   Neck Retraction 10 reps   Lateral Flexion 10 reps  Shoulder Rolls Backwards;10 reps   Other Seated Exercise scapular retraction 2x 10 with RTB     Manual Therapy   Manual Therapy Soft tissue mobilization;Manual Traction   Manual therapy comments Manual therapy complete separate rest of tx   Soft tissue mobilization Cervical musculature to reduce tightness (UT, levator and scapular mm)   Manual Traction Manual traction 3sets                  PT Short Term Goals - 07/22/16 1217      PT SHORT TERM GOAL #1   Title Pt to be able to verbalize the importance of posture in cervical health.   Time 1   Period Weeks   Status New     PT SHORT TERM GOAL #2   Title Pt cervical strength to be increased 1/2 grade to allow pain level to decrease to no greater than a 5/10 to allow pt to sleep waking only one time a night    Time 2   Period Weeks   Status New     PT SHORT TERM GOAL #3   Title  Pt Rt cervical rotation  to be improved by 10 degrees in right rotation to improve safety in driving.    Time 2   Period Weeks   Status New           PT Long Term Goals - 07/22/16 1220      PT LONG TERM GOAL #1   Title Pt strength in cervical area to be 5/5 to allow pt to bowl without increased pain    Time 4   Period Weeks   Status New     PT LONG TERM GOAL #2   Title Pain level in cervical area to be no greater than a 2/10 to allow pt to sleep without waking up    Time 4   Period Weeks   Status New     PT LONG TERM GOAL #3   Title Pt right rotation to be improved by 15 degrees so that pt is able to see his blind side while driving.    Time 4   Period Weeks   Status New               Plan - 08/07/16 [id number]    Clinical Impression Statement Pt improving with decreasing shoulder hiking during exercises.  Added lower trap strengthening to exercise program.     Rehab Potential Good   PT Frequency 2x / week   PT Duration 4 weeks   PT Treatment/Interventions ADLs/Self Care Home Management;Traction;Therapeutic activities;Therapeutic exercise;Patient/family education;Manual techniques   PT Next Visit Plan Begin t-band postrual exercises as well as  prone       Patient will benefit from skilled therapeutic intervention in order to improve the following deficits and impairments:  Decreased strength, Decreased range of motion, Postural dysfunction, Pain, Increased muscle spasms  Visit Diagnosis: Cervicalgia  Other muscle spasm     Problem List Patient Active Problem List   Diagnosis Date Noted  . S/P shoulder replacement 08/29/2012  . Pain in joint, shoulder region 08/29/2012  . Muscle weakness (generalized) 08/29/2012  . Arthritis of shoulder 08/12/2012    Rayetta Humphrey, PT CLT [phone] 08/07/2016, 9:51 AM  [place] [address] [place], [place], [address] Phone: [phone]   Fax:  [phone]  Name:  Scott Zamora MRN: [id number] Date of Birth: [birth year]

## 2016-08-12 ENCOUNTER — Ambulatory Visit (HOSPITAL_COMMUNITY): Payer: Medicare Other | Admitting: Physical Therapy

## 2016-08-12 DIAGNOSIS — M62838 Other muscle spasm: Secondary | ICD-10-CM

## 2016-08-12 DIAGNOSIS — M542 Cervicalgia: Secondary | ICD-10-CM | POA: Diagnosis not present

## 2016-08-12 DIAGNOSIS — M6281 Muscle weakness (generalized): Secondary | ICD-10-CM | POA: Diagnosis not present

## 2016-08-12 DIAGNOSIS — M25611 Stiffness of right shoulder, not elsewhere classified: Secondary | ICD-10-CM | POA: Diagnosis not present

## 2016-08-12 DIAGNOSIS — R29898 Other symptoms and signs involving the musculoskeletal system: Secondary | ICD-10-CM | POA: Diagnosis not present

## 2016-08-12 NOTE — Therapy (Signed)
[facility] [address] [place], [place], [address] Phone: [phone]   Fax:  [phone]  Physical Therapy Treatment  Patient Details  Name: Scott Zamora MRN: [id number] Date of Birth: 04-06-1935 Referring Provider: Sinda Du  Encounter Date: 08/12/2016    Past Medical History:  Diagnosis Date  . Arthritis   . Dry skin   . GERD (gastroesophageal reflux disease)   . Hypertension   . Inguinal hernia, left   . Seasonal allergies   . Sleep apnea    has CPAP but does not wear it  . Vertigo    hx of    Past Surgical History:  Procedure Laterality Date  . COLONOSCOPY W/ POLYPECTOMY    . EYE SURGERY Bilateral   . HERNIA REPAIR    . INGUINAL HERNIA REPAIR Left 04/18/2014   Procedure: HERNIA REPAIR INGUINAL ADULT;  Surgeon: Jamesetta So, MD;  [facility];  Service: General;  Laterality: Left;  . INSERTION OF MESH Left 04/18/2014   Procedure: INSERTION OF MESH;  Surgeon: Jamesetta So, MD;  [facility];  Service: General;  Laterality: Left;  . KNEE ARTHROSCOPY     left knee  . TOTAL SHOULDER ARTHROPLASTY  08/11/2012   Dr Justice Britain  . TOTAL SHOULDER ARTHROPLASTY  08/11/2012   Procedure: TOTAL SHOULDER ARTHROPLASTY;  Surgeon: Marin Shutter, MD;  Location: [place];  Service: Orthopedics;  Laterality: Left;  . TOTAL SHOULDER ARTHROPLASTY Right 09/12/2015   Procedure: RIGHT TOTAL SHOULDER ARTHROPLASTY;  Surgeon: Justice Britain, MD;  Location: [place];  Service: Orthopedics;  Laterality: Right;    There were no vitals filed for this visit.      Subjective Assessment - 08/12/16 0936    Subjective Pt states that he was very sore after last session and had a couple of bad days.  States that it is much easier for him to look for oncoming traffic when driving.    Currently in Pain? Yes   Pain Score 3    Pain Location Neck   Pain Orientation Left   Pain Descriptors / Indicators Sore   Pain Type Chronic pain   Pain Onset  More than a month ago   Pain Frequency Intermittent                         [facility] Adult PT Treatment/Exercise - 08/12/16 0001      Neck Exercises: Standing   Other Standing Exercises (P)  3 way pectoral stretch B x 3 15" each     Neck Exercises: Seated   Cervical Rotation (P)  10 reps   Lateral Flexion (P)  10 reps     Neck Exercises: Supine   Neck Retraction (P)  10 reps   Other Supine Exercise (P)  scapuar retraction      Neck Exercises: Prone   Shoulder Extension (P)  10 reps   Shoulder Extension Weights (lbs) (P)  3   Other Prone Exercise (P)  Rows x 10 with 3#   Other Prone Exercise (P)  Attempted Y's but pt was unable to perform due to tight mm.     Manual Therapy   Manual Therapy (P)  Soft tissue mobilization;Manual Traction   Manual therapy comments (P)  Manual therapy complete separate rest of tx   Soft tissue mobilization (P)  Cervical musculature to reduce tightness (UT, levator and scapular mm)   Manual Traction (P)  Manual traction 3sets  Neck Exercises: Stretches   Chest Stretch (P)  5 reps                  PT Short Term Goals - 07/22/16 1217      PT SHORT TERM GOAL #1   Title Pt to be able to verbalize the importance of posture in cervical health.   Time 1   Period Weeks   Status New     PT SHORT TERM GOAL #2   Title Pt cervical strength to be increased 1/2 grade to allow pain level to decrease to no greater than a 5/10 to allow pt to sleep waking only one time a night    Time 2   Period Weeks   Status New     PT SHORT TERM GOAL #3   Title Pt Rt cervical rotation  to be improved by 10 degrees in right rotation to improve safety in driving.    Time 2   Period Weeks   Status New           PT Long Term Goals - 07/22/16 1220      PT LONG TERM GOAL #1   Title Pt strength in cervical area to be 5/5 to allow pt to bowl without increased pain    Time 4   Period Weeks   Status New     PT LONG TERM GOAL #2   Title  Pain level in cervical area to be no greater than a 2/10 to allow pt to sleep without waking up    Time 4   Period Weeks   Status New     PT LONG TERM GOAL #3   Title Pt right rotation to be improved by 15 degrees so that pt is able to see his blind side while driving.    Time 4   Period Weeks   Status New               Plan - 08/12/16 1043    Clinical Impression Statement Attempted prone Y's and I's pt unable to complete due to tight mm.  Began three way pectorial stretch to program.  Due to increase soreness treatment focused on manual to decrease mm tension on Lt  cervical paraspinal mm.    Rehab Potential Good   PT Frequency 2x / week   PT Duration 4 weeks   PT Treatment/Interventions ADLs/Self Care Home Management;Traction;Therapeutic activities;Therapeutic exercise;Patient/family education;Manual techniques   PT Next Visit Plan Begin t-band postrual exercises attempt prone T raises .      Patient will benefit from skilled therapeutic intervention in order to improve the following deficits and impairments:  Decreased strength, Decreased range of motion, Postural dysfunction, Pain, Increased muscle spasms  Visit Diagnosis: Cervicalgia  Other muscle spasm     Problem List Patient Active Problem List   Diagnosis Date Noted  . S/P shoulder replacement 08/29/2012  . Pain in joint, shoulder region 08/29/2012  . Muscle weakness (generalized) 08/29/2012  . Arthritis of shoulder 08/12/2012    Rayetta Humphrey, PT CLT [phone] 08/12/2016, 10:48 AM  [facility] [place] [address] [place], [place], [address] Phone: [phone]   Fax:  [phone]  Name: Scott Zamora MRN: [id number] Date of Birth: Dec 24, 1934

## 2016-08-14 ENCOUNTER — Ambulatory Visit (HOSPITAL_COMMUNITY): Payer: Medicare Other | Attending: Pulmonary Disease | Admitting: Physical Therapy

## 2016-08-14 DIAGNOSIS — M542 Cervicalgia: Secondary | ICD-10-CM | POA: Diagnosis not present

## 2016-08-14 DIAGNOSIS — M62838 Other muscle spasm: Secondary | ICD-10-CM | POA: Diagnosis not present

## 2016-08-14 NOTE — Therapy (Signed)
[facility] [address] [place], [place], [address] Phone: [phone]   Fax:  [phone]  Physical Therapy Treatment  Patient Details  Name: Scott Zamora MRN: [id number] Date of Birth: 09/18/1934 Referring Provider: Sinda Du  Encounter Date: 08/14/2016      PT End of Session - 08/14/16 1202    Visit Number 6   Number of Visits 8   Date for PT Re-Evaluation 08/21/16   Authorization Type medicare   Authorization - Visit Number 6   Authorization - Number of Visits 8   PT Start Time 0909   PT Stop Time 0950   PT Time Calculation (min) 41 min   Activity Tolerance Patient tolerated treatment well   Behavior During Therapy [facility] for tasks assessed/performed      Past Medical History:  Diagnosis Date  . Arthritis   . Dry skin   . GERD (gastroesophageal reflux disease)   . Hypertension   . Inguinal hernia, left   . Seasonal allergies   . Sleep apnea    has CPAP but does not wear it  . Vertigo    hx of    Past Surgical History:  Procedure Laterality Date  . COLONOSCOPY W/ POLYPECTOMY    . EYE SURGERY Bilateral   . HERNIA REPAIR    . INGUINAL HERNIA REPAIR Left 04/18/2014   Procedure: HERNIA REPAIR INGUINAL ADULT;  Surgeon: Jamesetta So, MD;  [facility];  Service: General;  Laterality: Left;  . INSERTION OF MESH Left 04/18/2014   Procedure: INSERTION OF MESH;  Surgeon: Jamesetta So, MD;  [facility];  Service: General;  Laterality: Left;  . KNEE ARTHROSCOPY     left knee  . TOTAL SHOULDER ARTHROPLASTY  08/11/2012   Dr Justice Britain  . TOTAL SHOULDER ARTHROPLASTY  08/11/2012   Procedure: TOTAL SHOULDER ARTHROPLASTY;  Surgeon: Marin Shutter, MD;  Location: [place];  Service: Orthopedics;  Laterality: Left;  . TOTAL SHOULDER ARTHROPLASTY Right 09/12/2015   Procedure: RIGHT TOTAL SHOULDER ARTHROPLASTY;  Surgeon: Justice Britain, MD;  Location: [place];  Service: Orthopedics;  Laterality: Right;    There were no  vitals filed for this visit.      Subjective Assessment - 08/14/16 1158    Subjective Pt states that he is more aware of trying to relax his shoulders.  Pt states his overall pain continues to decrease.    Currently in Pain? Yes   Pain Score 2    Pain Location Neck   Pain Orientation Left   Pain Descriptors / Indicators Aching;Tender   Pain Type Chronic pain   Pain Onset More than a month ago   Pain Frequency Intermittent   Aggravating Factors  motion   Pain Relieving Factors heat   Effect of Pain on Daily Activities increases                          [facility] Adult PT Treatment/Exercise - 08/14/16 0001      Exercises   Exercises Neck     Neck Exercises: Machines for Strengthening   UBE (Upper Arm Bike) 4'  backward to try and increase use of back mm     Neck Exercises: Theraband   Scapula Retraction 10 reps;Red   Shoulder Extension 10 reps;Red   Rows 10 reps;Red     Neck Exercises: Standing   Other Standing Exercises table plank 30" x 3   Other Standing Exercises  W to V against wall then take hands off for lower trapezius strengthening     Neck Exercises: Supine   Neck Retraction 10 reps   Cervical Rotation 5 reps   Other Supine Exercise scapuar retraction      Manual Therapy   Manual Therapy Soft tissue mobilization;Manual Traction   Manual therapy comments Manual therapy complete separate rest of tx   Soft tissue mobilization Cervical musculature to reduce tightness (UT, levator and scapular mm)   Manual Traction Manual traction 3sets                  PT Short Term Goals - 07/22/16 1217      PT SHORT TERM GOAL #1   Title Pt to be able to verbalize the importance of posture in cervical health.   Time 1   Period Weeks   Status New     PT SHORT TERM GOAL #2   Title Pt cervical strength to be increased 1/2 grade to allow pain level to decrease to no greater than a 5/10 to allow pt to sleep waking only one time a night    Time 2    Period Weeks   Status New     PT SHORT TERM GOAL #3   Title Pt Rt cervical rotation  to be improved by 10 degrees in right rotation to improve safety in driving.    Time 2   Period Weeks   Status New           PT Long Term Goals - 07/22/16 1220      PT LONG TERM GOAL #1   Title Pt strength in cervical area to be 5/5 to allow pt to bowl without increased pain    Time 4   Period Weeks   Status New     PT LONG TERM GOAL #2   Title Pain level in cervical area to be no greater than a 2/10 to allow pt to sleep without waking up    Time 4   Period Weeks   Status New     PT LONG TERM GOAL #3   Title Pt right rotation to be improved by 15 degrees so that pt is able to see his blind side while driving.    Time 4   Period Weeks   Status New               Plan - 08/14/16 1203    Clinical Impression Statement Revisited theraband for postrual correction with much better technique although pt still needed verbal cuing to relax upper trapezius mm.  Added UBE to program as well .  Overall reduction in pain and improvement in ROM    Rehab Potential Good   PT Frequency 2x / week   PT Duration 4 weeks   PT Treatment/Interventions ADLs/Self Care Home Management;Traction;Therapeutic activities;Therapeutic exercise;Patient/family education;Manual techniques   PT Next Visit Plan attempt prone T raises .      Patient will benefit from skilled therapeutic intervention in order to improve the following deficits and impairments:  Decreased strength, Decreased range of motion, Postural dysfunction, Pain, Increased muscle spasms  Visit Diagnosis: Cervicalgia  Other muscle spasm     Problem List Patient Active Problem List   Diagnosis Date Noted  . S/P shoulder replacement 08/29/2012  . Pain in joint, shoulder region 08/29/2012  . Muscle weakness (generalized) 08/29/2012  . Arthritis of shoulder 08/12/2012    Azucena Freed PT/CLT [phone] 08/14/2016, 12:05 PM  [facility]  [place], [place], [address] Phone: [phone]   Fax:  [phone]  Name: Scott Zamora MRN: [id number] Date of Birth: [birth year]

## 2016-08-18 ENCOUNTER — Ambulatory Visit (HOSPITAL_COMMUNITY): Payer: Medicare Other | Admitting: Physical Therapy

## 2016-08-18 ENCOUNTER — Telehealth (HOSPITAL_COMMUNITY): Payer: Self-pay | Admitting: Physical Therapy

## 2016-08-18 ENCOUNTER — Telehealth (HOSPITAL_COMMUNITY): Payer: Self-pay | Admitting: Pulmonary Disease

## 2016-08-18 NOTE — Telephone Encounter (Signed)
08/18/16 pt left a message that he was cx today because he has a bad cold

## 2016-08-18 NOTE — Telephone Encounter (Signed)
Called re missed appointment.  Pt wife stated that he had called and left a message.  He will not be able to attend today due to a bad cold.  Rayetta Humphrey, [place] CLT [phone]

## 2016-08-19 ENCOUNTER — Telehealth (HOSPITAL_COMMUNITY): Payer: Self-pay | Admitting: Pulmonary Disease

## 2016-08-19 NOTE — Telephone Encounter (Signed)
08/19/16 wife cx the appt.  She said that he had a bad cough but I did RS the appt

## 2016-08-20 ENCOUNTER — Ambulatory Visit (HOSPITAL_COMMUNITY): Payer: Medicare Other | Admitting: Physical Therapy

## 2016-08-26 ENCOUNTER — Ambulatory Visit (HOSPITAL_COMMUNITY): Payer: Medicare Other | Admitting: Physical Therapy

## 2016-08-26 DIAGNOSIS — M542 Cervicalgia: Secondary | ICD-10-CM | POA: Diagnosis not present

## 2016-08-26 DIAGNOSIS — M62838 Other muscle spasm: Secondary | ICD-10-CM | POA: Diagnosis not present

## 2016-08-26 NOTE — Therapy (Signed)
[place], [place], [address] Phone: [phone]   Fax:  [phone]  Physical Therapy Treatment  Patient Details  Name: Scott Zamora MRN: [id number] Date of Birth: 1935/06/07 Referring Provider: Sinda Du  Encounter Date: 08/26/2016      PT End of Session - 08/26/16 1212    Visit Number 7   Number of Visits 8   Date for PT Re-Evaluation 08/21/16   Authorization Type medicare   Authorization - Visit Number 7   Authorization - Number of Visits 8   PT Start Time 1120   PT Stop Time 1200   PT Time Calculation (min) 40 min   Activity Tolerance Patient tolerated treatment well   Behavior During Therapy [facility] for tasks assessed/performed      Past Medical History:  Diagnosis Date  . Arthritis   . Dry skin   . GERD (gastroesophageal reflux disease)   . Hypertension   . Inguinal hernia, left   . Seasonal allergies   . Sleep apnea    has CPAP but does not wear it  . Vertigo    hx of    Past Surgical History:  Procedure Laterality Date  . COLONOSCOPY W/ POLYPECTOMY    . EYE SURGERY Bilateral   . HERNIA REPAIR    . INGUINAL HERNIA REPAIR Left 04/18/2014   Procedure: HERNIA REPAIR INGUINAL ADULT;  Surgeon: Jamesetta So, MD;  Location: [facility];  Service: General;  Laterality: Left;  . INSERTION OF MESH Left 04/18/2014   Procedure: INSERTION OF MESH;  Surgeon: Jamesetta So, MD;  Location: [facility];  Service: General;  Laterality: Left;  . KNEE ARTHROSCOPY     left knee  . TOTAL SHOULDER ARTHROPLASTY  08/11/2012   Dr Justice Britain  . TOTAL SHOULDER ARTHROPLASTY  08/11/2012   Procedure: TOTAL SHOULDER ARTHROPLASTY;  Surgeon: Marin Shutter, MD;  Location: [facility];  Service: Orthopedics;  Laterality: Left;  . TOTAL SHOULDER ARTHROPLASTY Right 09/12/2015   Procedure: RIGHT TOTAL SHOULDER ARTHROPLASTY;  Surgeon: Justice Britain, MD;  Location: [facility];  Service: Orthopedics;  Laterality: Right;    There were no  vitals filed for this visit.      Subjective Assessment - 08/26/16 1134    Subjective Pt states he has missed the past week due to his wife and himself having the flu.  Pt states he has had 2 "bad days" this past week but overall feels better.  No pain currently.  [facility] and completed a Tai Chi class.  pt states he goes there 3X week and goes bowling on Mondays                         [facility] Adult PT Treatment/Exercise - 08/26/16 0001      Neck Exercises: Machines for Strengthening   UBE (Upper Arm Bike) 4'     Neck Exercises: Theraband   Scapula Retraction 10 reps;Green   Shoulder Extension 10 reps;Green   Rows 10 reps;Green     Neck Exercises: Prone   Shoulder Extension 15 reps   Shoulder Extension Weights (lbs) 3   Other Prone Exercise Rows x 15 with 3#   Other Prone Exercise planks 3X10" on elbows     Manual Therapy   Manual Therapy Soft tissue mobilization;Manual Traction   Manual therapy comments Manual therapy complete separate rest of tx   Soft tissue  mobilization Cervical musculature to reduce tightness (UT, levator and scapular mm)   Manual Traction Manual traction 5sets of 30" holds                  PT Short Term Goals - 07/22/16 1217      PT SHORT TERM GOAL #1   Title Pt to be able to verbalize the importance of posture in cervical health.   Time 1   Period Weeks   Status New     PT SHORT TERM GOAL #2   Title Pt cervical strength to be increased 1/2 grade to allow pain level to decrease to no greater than a 5/10 to allow pt to sleep waking only one time a night    Time 2   Period Weeks   Status New     PT SHORT TERM GOAL #3   Title Pt Rt cervical rotation  to be improved by 10 degrees in right rotation to improve safety in driving.    Time 2   Period Weeks   Status New           PT Long Term Goals - 07/22/16 1220      PT LONG TERM GOAL #1   Title Pt strength in cervical area to  be 5/5 to allow pt to bowl without increased pain    Time 4   Period Weeks   Status New     PT LONG TERM GOAL #2   Title Pain level in cervical area to be no greater than a 2/10 to allow pt to sleep without waking up    Time 4   Period Weeks   Status New     PT LONG TERM GOAL #3   Title Pt right rotation to be improved by 15 degrees so that pt is able to see his blind side while driving.    Time 4   Period Weeks   Status New               Plan - 08/26/16 1213    Clinical Impression Statement Pt with overall improvement.  Cues needed with scapular retration tband exercise due to activation of levator scapula.  Other postural exercises able to complete well without difficulty.  Progressed to prone plank modified on elbows this session with good form and stability.  Very little radiating symptoms this past week, however continued with manual traction with no pain at end of session.     Rehab Potential Good   PT Frequency 2x / week   PT Duration 4 weeks   PT Treatment/Interventions ADLs/Self Care Home Management;Traction;Therapeutic activities;Therapeutic exercise;Patient/family education;Manual techniques   PT Next Visit Plan Re-assess next session to determine continued need for skilled services.       Patient will benefit from skilled therapeutic intervention in order to improve the following deficits and impairments:  Decreased strength, Decreased range of motion, Postural dysfunction, Pain, Increased muscle spasms  Visit Diagnosis: Cervicalgia  Other muscle spasm     Problem List Patient Active Problem List   Diagnosis Date Noted  . S/P shoulder replacement 08/29/2012  . Pain in joint, shoulder region 08/29/2012  . Muscle weakness (generalized) 08/29/2012  . Arthritis of shoulder 08/12/2012    Teena Irani, PTA/CLT [phone]  08/26/2016, 12:16 PM  [place] [address] [place], [place], [address] Phone:  [phone]   Fax:  [phone]  Name: Scott Zamora MRN: [id number] Date of Birth: [birth year]

## 2016-08-27 ENCOUNTER — Ambulatory Visit (HOSPITAL_COMMUNITY): Payer: Medicare Other | Admitting: Physical Therapy

## 2016-08-27 DIAGNOSIS — M542 Cervicalgia: Secondary | ICD-10-CM | POA: Diagnosis not present

## 2016-08-27 DIAGNOSIS — M62838 Other muscle spasm: Secondary | ICD-10-CM

## 2016-08-27 NOTE — Therapy (Signed)
[place], [place], [address] Phone: [phone]   Fax:  [phone]  Physical Therapy Treatment  Patient Details  Name: Scott Zamora MRN: [id number] Date of Birth: [birth year] Referring Provider: Sinda Du   Encounter Date: 08/27/2016      PT End of Session - 08/27/16 1515    Visit Number 8   Number of Visits 8   Date for PT Re-Evaluation 08/21/16   Authorization Type medicare   Authorization - Visit Number 8   Authorization - Number of Visits 8   PT Start Time 3295   PT Stop Time 1515   PT Time Calculation (min) 40 min   Activity Tolerance Patient tolerated treatment well   Behavior During Therapy [facility] for tasks assessed/performed      Past Medical History:  Diagnosis Date  . Arthritis   . Dry skin   . GERD (gastroesophageal reflux disease)   . Hypertension   . Inguinal hernia, left   . Seasonal allergies   . Sleep apnea    has CPAP but does not wear it  . Vertigo    hx of    Past Surgical History:  Procedure Laterality Date  . COLONOSCOPY W/ POLYPECTOMY    . EYE SURGERY Bilateral   . HERNIA REPAIR    . INGUINAL HERNIA REPAIR Left 04/18/2014   Procedure: HERNIA REPAIR INGUINAL ADULT;  Surgeon: Jamesetta So, MD;  Location: [facility];  Service: General;  Laterality: Left;  . INSERTION OF MESH Left 04/18/2014   Procedure: INSERTION OF MESH;  Surgeon: Jamesetta So, MD;  Location: [facility];  Service: General;  Laterality: Left;  . KNEE ARTHROSCOPY     left knee  . TOTAL SHOULDER ARTHROPLASTY  08/11/2012   Dr Justice Britain  . TOTAL SHOULDER ARTHROPLASTY  08/11/2012   Procedure: TOTAL SHOULDER ARTHROPLASTY;  Surgeon: Marin Shutter, MD;  Location: [facility];  Service: Orthopedics;  Laterality: Left;  . TOTAL SHOULDER ARTHROPLASTY Right 09/12/2015   Procedure: RIGHT TOTAL SHOULDER ARTHROPLASTY;  Surgeon: Justice Britain, MD;  Location: [facility];  Service: Orthopedics;  Laterality: Right;    There were no  vitals filed for this visit.      Subjective Assessment - 08/27/16 1438    Subjective Pt states that his neck is feeling great right now but this morning he woke up with some pain.  He does not have any questions on his HEP.  He is doing them at least once a day and he can tell that he can move his neck much easier.      Currently in Pain? No/denies            [facility] PT Assessment - 08/27/16 0001      Assessment   Medical Diagnosis Cervical pain    Referring Provider Sinda Du    Onset Date/Surgical Date 06/12/16   Next MD Visit unknown   Prior Therapy not for this problem      Precautions   Precautions None     Restrictions   Weight Bearing Restrictions No     Balance Screen   Has the patient fallen in the past 6 months No   Has the patient had a decrease in activity level because of a fear of falling?  Yes   Is the patient reluctant to leave their home because of a fear of falling?  No     Home Ecologist residence  Prior Function   Level of Independence Independent   Leisure bowling      Cognition   Overall Cognitive Status Within Functional Limits for tasks assessed     Observation/Other Assessments   Focus on Therapeutic Outcomes (FOTO)  59     Posture/Postural Control   Posture/Postural Control Postural limitations   Postural Limitations Rounded Shoulders;Forward head     AROM   Cervical Flexion 50 reps no change in pain   was 40   Cervical Extension 50 reps no change in pain    Cervical - Right Side Bend 25 reps increase pain on the left.  was 21   Cervical - Left Side Bend 35 reps slight  increase of pain on right  was 23    Cervical - Right Rotation 75  35   Cervical - Left Rotation 68  58     Strength   Cervical Extension 5/5   Cervical - Right Side Bend 4/5  was 45    Cervical - Left Side Bend 4/5  was 4/5      Palpation   Palpation comment moderate mm spasms                       [facility] Adult PT Treatment/Exercise - 08/27/16 0001      Manual Therapy   Manual Therapy Soft tissue mobilization;Manual Traction   Manual therapy comments Manual therapy complete separate rest of tx   Soft tissue mobilization Cervical musculature to reduce tightness (UT, levator and scapular mm)   Manual Traction Manual traction 5sets of 30" holds                PT Education - 08/27/16 1514    Education provided Yes   Education Details To complete cervical exercises if pt is in his recliner greater than two hours at a time.    Person(s) Educated Patient   Methods Explanation   Comprehension Verbalized understanding          PT Short Term Goals - 08/27/16 1516      PT SHORT TERM GOAL #1   Title Pt to be able to verbalize the importance of posture in cervical health.   Time 1   Period Weeks   Status Achieved  but not always followed      PT SHORT TERM GOAL #2   Title Pt cervical strength to be increased 1/2 grade to allow pain level to decrease to no greater than a 5/10 to allow pt to sleep waking only one time a night    Time 2   Period Weeks   Status Achieved     PT SHORT TERM GOAL #3   Title Pt Rt cervical rotation  to be improved by 10 degrees in right rotation to improve safety in driving.    Time 2   Period Weeks   Status Achieved           PT Long Term Goals - 08/27/16 1517      PT LONG TERM GOAL #1   Title Pt strength in cervical area to be 5/5 to allow pt to bowl without increased pain    Time 4   Period Weeks   Status Partially Met     PT LONG TERM GOAL #2   Title Pain level in cervical area to be no greater than a 2/10 to allow pt to sleep without waking up    Time 4   Period Weeks  Status Achieved     PT LONG TERM GOAL #3   Title Pt right rotation to be improved by 15 degrees so that pt is able to see his blind side while driving.    Time 4   Period Weeks   Status Achieved               Plan - 09-02-2016 1515    Clinical  Impression Statement Pt has significant ROM improvement, decreased paina nd improved strength.  Pt is I in HEP and is ready for discharge at this time.  No significant mm spasms noted at this time.    Rehab Potential Good   PT Frequency 2x / week   PT Duration 4 weeks   PT Treatment/Interventions ADLs/Self Care Home Management;Traction;Therapeutic activities;Therapeutic exercise;Patient/family education;Manual techniques   PT Next Visit Plan Pt to be discharged to HEP       Patient will benefit from skilled therapeutic intervention in order to improve the following deficits and impairments:  Decreased strength, Decreased range of motion, Postural dysfunction, Pain, Increased muscle spasms  Visit Diagnosis: Cervicalgia  Other muscle spasm       G-Codes - 02-Sep-2016 1517    Functional Assessment Tool Used foto, Rotation   Functional Limitation Changing and maintaining body position   Changing and Maintaining Body Position Goal Status (G2563) At least 1 percent but less than 20 percent impaired, limited or restricted   Changing and Maintaining Body Position Discharge Status (S9373) At least 20 percent but less than 40 percent impaired, limited or restricted      Problem List Patient Active Problem List   Diagnosis Date Noted  . S/P shoulder replacement 08/29/2012  . Pain in joint, shoulder region 08/29/2012  . Muscle weakness (generalized) 08/29/2012  . Arthritis of shoulder 08/12/2012    Rayetta Humphrey, PT CLT [phone] 09-02-16, 3:59 PM  [place], [place], [address] Phone: [phone]   Fax:  [phone]  Name: Scott Zamora MRN: [id number] Date of Birth: Apr 29, 1935  PHYSICAL THERAPY DISCHARGE SUMMARY  Visits from Start of Care: 8  Current functional level related to goals / functional outcomes: As above    Remaining deficits: As above   Education / Equipment: HEP, the importance of  posture and body mechanics in controlling neck pain. Plan: Patient agrees to discharge.  Patient goals were met. Patient is being discharged due to meeting the stated rehab goals.  ?????        Rayetta Humphrey, [facility] CLT [phone]

## 2016-12-08 DIAGNOSIS — S30860A Insect bite (nonvenomous) of lower back and pelvis, initial encounter: Secondary | ICD-10-CM | POA: Diagnosis not present

## 2016-12-08 DIAGNOSIS — W57XXXA Bitten or stung by nonvenomous insect and other nonvenomous arthropods, initial encounter: Secondary | ICD-10-CM | POA: Diagnosis not present

## 2016-12-11 ENCOUNTER — Encounter: Payer: Self-pay | Admitting: Family Medicine

## 2016-12-11 DIAGNOSIS — M159 Polyosteoarthritis, unspecified: Secondary | ICD-10-CM | POA: Diagnosis not present

## 2016-12-11 DIAGNOSIS — R498 Other voice and resonance disorders: Secondary | ICD-10-CM | POA: Diagnosis not present

## 2016-12-11 DIAGNOSIS — N400 Enlarged prostate without lower urinary tract symptoms: Secondary | ICD-10-CM | POA: Diagnosis not present

## 2016-12-11 DIAGNOSIS — K210 Gastro-esophageal reflux disease with esophagitis: Secondary | ICD-10-CM | POA: Diagnosis not present

## 2016-12-11 DIAGNOSIS — W57XXXA Bitten or stung by nonvenomous insect and other nonvenomous arthropods, initial encounter: Secondary | ICD-10-CM | POA: Diagnosis not present

## 2016-12-11 DIAGNOSIS — I10 Essential (primary) hypertension: Secondary | ICD-10-CM | POA: Diagnosis not present

## 2016-12-11 DIAGNOSIS — M129 Arthropathy, unspecified: Secondary | ICD-10-CM | POA: Diagnosis not present

## 2017-02-05 ENCOUNTER — Other Ambulatory Visit: Payer: Self-pay

## 2017-03-08 DIAGNOSIS — M542 Cervicalgia: Secondary | ICD-10-CM | POA: Diagnosis not present

## 2017-03-08 DIAGNOSIS — I10 Essential (primary) hypertension: Secondary | ICD-10-CM | POA: Diagnosis not present

## 2017-03-08 DIAGNOSIS — K5904 Chronic idiopathic constipation: Secondary | ICD-10-CM | POA: Diagnosis not present

## 2017-03-08 DIAGNOSIS — M159 Polyosteoarthritis, unspecified: Secondary | ICD-10-CM | POA: Diagnosis not present

## 2017-03-26 IMAGING — DX DG CERVICAL SPINE COMPLETE 4+V
5 series · 5 of 5 positions shown · non-contrast
Comparison: No recent prior .

CLINICAL DATA: Neck pain.  Paresthesias.

EXAM:
CERVICAL SPINE - COMPLETE 4+ VIEW

[c-spine lat]
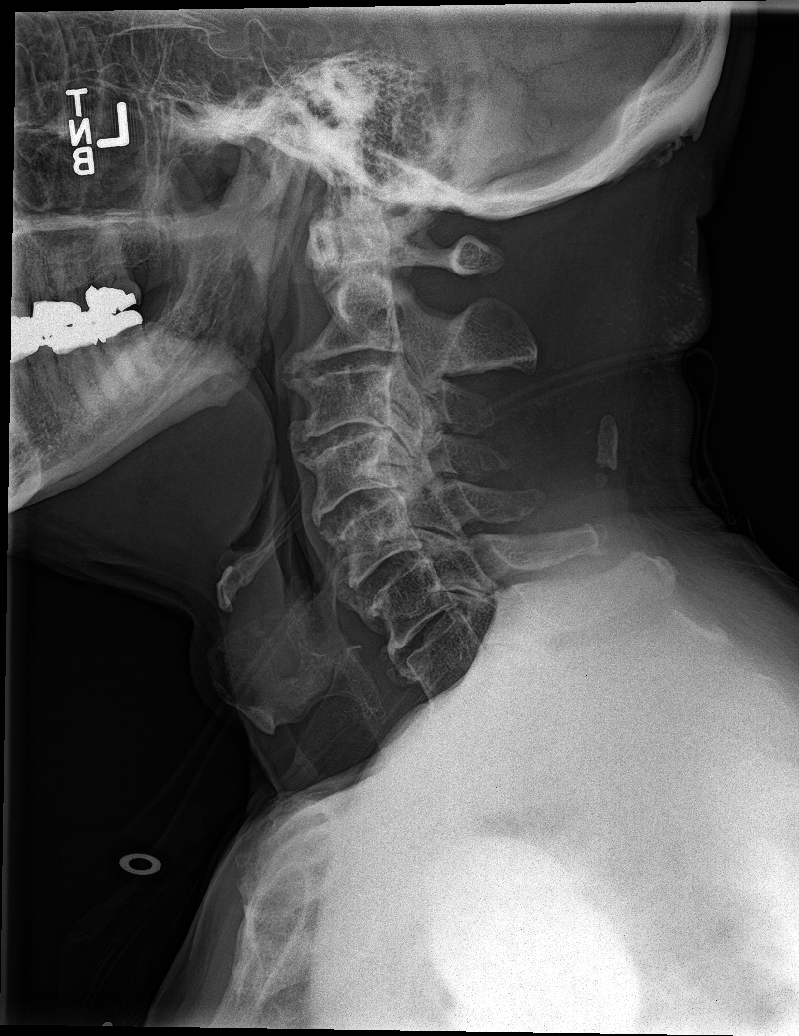

[c-spine obl (1 of 2)]
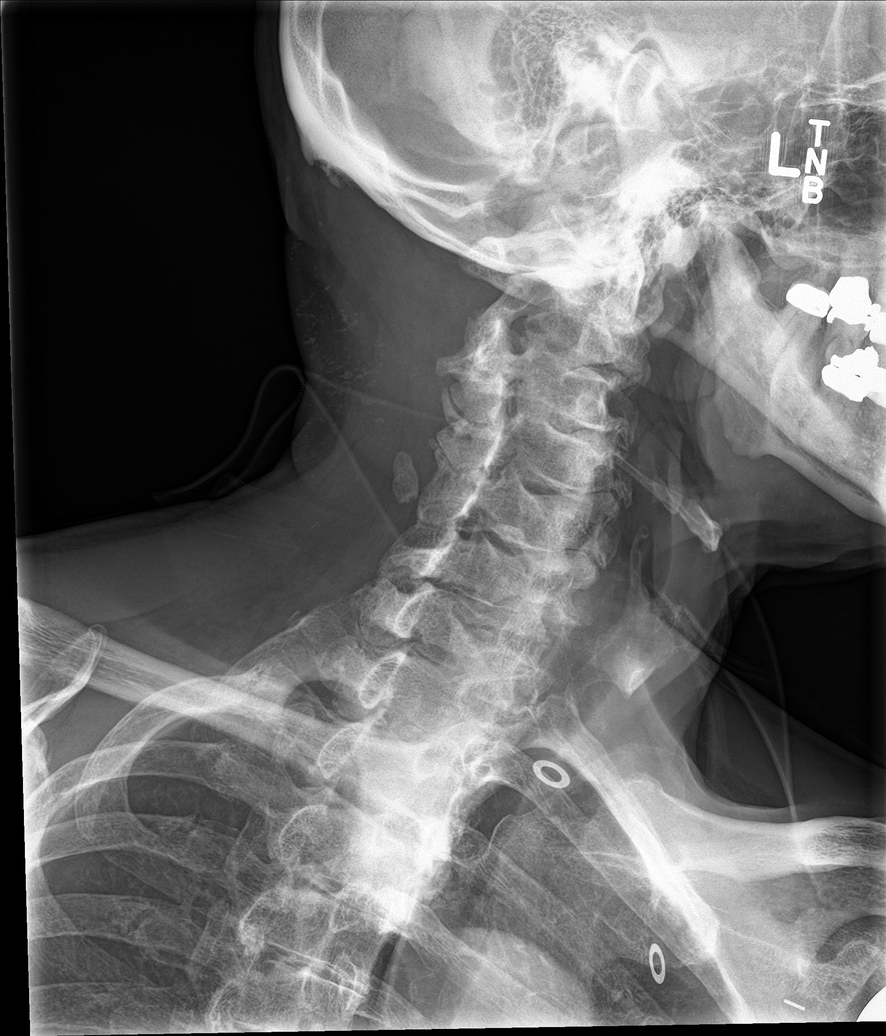

[c-spine obl (2 of 2)]
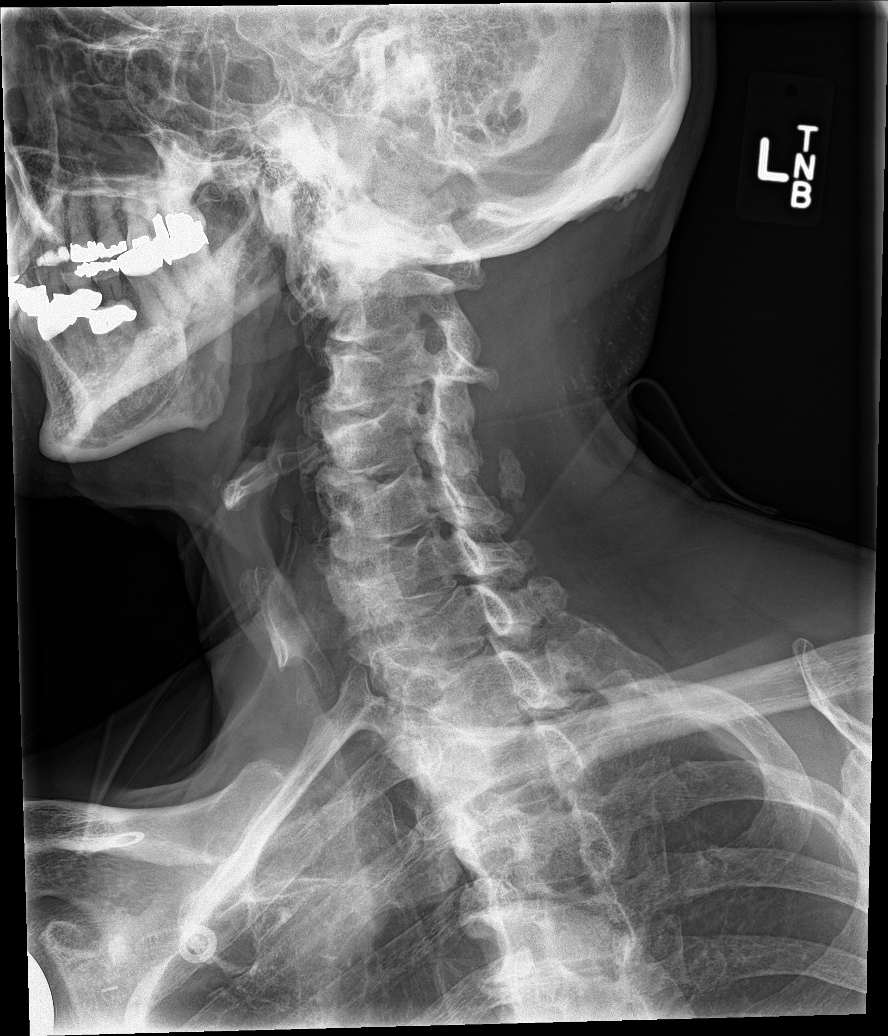

[c-spine ap]
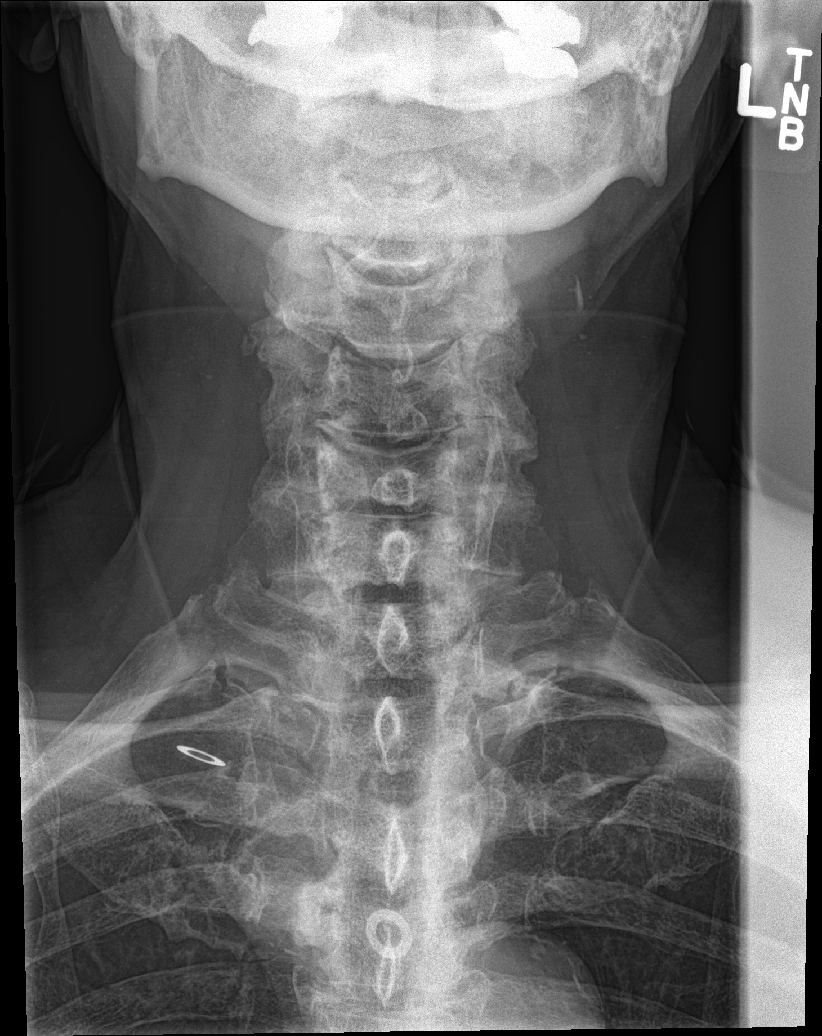

[c-spine open mouth]
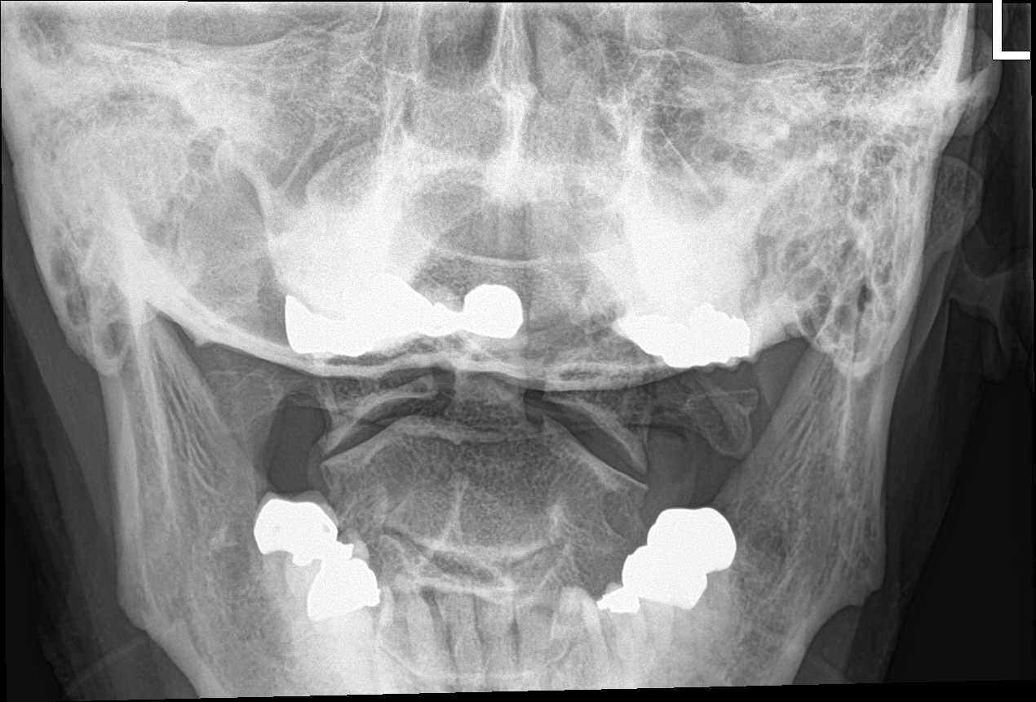

[5 of 5 positions shown; findings below may reference images not displayed]

FINDINGS: Diffuse multilevel degenerative change present. Degenerative changes
most prominent C2-C3 and C3-C4. Prominent multifocal bilateral
neural foraminal narrowing. No evidence of fracture dislocation.
Ligamentous ossification noted. Carotid vascular calcification
noted.
IMPRESSION: 1. Severe degenerative changes cervical spine with prominent
multifocal bilateral neural foraminal narrowing. No acute bony
abnormality.

2. Carotid vascular disease .

## 2017-04-27 DIAGNOSIS — Z23 Encounter for immunization: Secondary | ICD-10-CM | POA: Diagnosis not present

## 2017-07-14 DIAGNOSIS — I10 Essential (primary) hypertension: Secondary | ICD-10-CM | POA: Diagnosis not present

## 2017-07-14 DIAGNOSIS — M542 Cervicalgia: Secondary | ICD-10-CM | POA: Diagnosis not present

## 2017-07-14 DIAGNOSIS — M4722 Other spondylosis with radiculopathy, cervical region: Secondary | ICD-10-CM | POA: Diagnosis not present

## 2017-08-04 ENCOUNTER — Encounter (HOSPITAL_COMMUNITY): Payer: Self-pay

## 2017-08-04 ENCOUNTER — Ambulatory Visit (HOSPITAL_COMMUNITY): Payer: Medicare Other | Attending: Neurological Surgery

## 2017-08-04 DIAGNOSIS — M62838 Other muscle spasm: Secondary | ICD-10-CM | POA: Diagnosis not present

## 2017-08-04 DIAGNOSIS — M542 Cervicalgia: Secondary | ICD-10-CM | POA: Diagnosis not present

## 2017-08-04 DIAGNOSIS — R293 Abnormal posture: Secondary | ICD-10-CM | POA: Insufficient documentation

## 2017-08-04 NOTE — Patient Instructions (Signed)
  Cervical Retraction Scott Zamora  While maintaining good posture, sit neutrally and place your pointer finger against your chin while looking straight ahead. Without moving the finger, draw your head directly backwards, keeping your gaze parallel to the floor. Return to neutral, bringing your chin back against your pointer finger. Make sure you are not going past the neutral neck position.  Perform 1x/day, making sure to be in good posture, 2-3 sets of 10 reps, holding for 3-5 seconds at end range

## 2017-08-04 NOTE — Therapy (Signed)
[place], [place], [address] Phone: [phone]   Fax:  [phone]  Physical Therapy Evaluation  Patient Details  Name: Scott Zamora MRN: [id number] Date of Birth: [birth year] Referring Provider: Tamala Fothergill, MD   Encounter Date: 08/04/2017  PT End of Session - 08/04/17 1612    Visit Number  1    Number of Visits  13    Date for PT Re-Evaluation  08/25/17    Authorization Type  Medicare Part A and B; Secondary: [facility]; Tertiary: [employer]    Authorization Time Period  08/04/17 to 09/15/17    PT Start Time  1520    PT Stop Time  1605    PT Time Calculation (min)  45 min    Activity Tolerance  Patient tolerated treatment well    Behavior During Therapy  WFL for tasks assessed/performed       Past Medical History:  Diagnosis Date  . Arthritis   . Dry skin   . GERD (gastroesophageal reflux disease)   . Hypertension   . Inguinal hernia, left   . Seasonal allergies   . Sleep apnea    has CPAP but does not wear it  . Vertigo    hx of    Past Surgical History:  Procedure Laterality Date  . COLONOSCOPY W/ POLYPECTOMY    . EYE SURGERY Bilateral   . HERNIA REPAIR    . INGUINAL HERNIA REPAIR Left 04/18/2014   Procedure: HERNIA REPAIR INGUINAL ADULT;  Surgeon: Jamesetta So, MD;  Location: [facility];  Service: General;  Laterality: Left;  . INSERTION OF MESH Left 04/18/2014   Procedure: INSERTION OF MESH;  Surgeon: Jamesetta So, MD;  Location: [facility];  Service: General;  Laterality: Left;  . KNEE ARTHROSCOPY     left knee  . TOTAL SHOULDER ARTHROPLASTY  08/11/2012   Dr Justice Britain  . TOTAL SHOULDER ARTHROPLASTY  08/11/2012   Procedure: TOTAL SHOULDER ARTHROPLASTY;  Surgeon: Marin Shutter, MD;  Location: [facility];  Service: Orthopedics;  Laterality: Left;  . TOTAL SHOULDER ARTHROPLASTY Right 09/12/2015   Procedure: RIGHT TOTAL SHOULDER ARTHROPLASTY;  Surgeon: Justice Britain, MD;  Location:  [facility];  Service: Orthopedics;  Laterality: Right;    There were no vitals filed for this visit.   Subjective Assessment - 08/04/17 1532    Subjective  Pt states that he has been having neck pain for about a year of insidious onset. It is only on his L side of his neck and he denies any LUE radicular symptoms or n/t. He denies any changes in his grip strength. He has the most difficulty with turning his head left or right. He denies any issues with sleeping due to the pain. The more active he is, the less pain he has. He states that the longer he sits, the worse his neck pain will get. He states that after watching movies for a couple of hours, his neck pain will increase. He states that he will intermittently get slight tension HA with this pain.     Limitations  Sitting    How long can you sit comfortably?  a few hours    How long can you stand comfortably?  no issues    How long can you walk comfortably?  no issues    Diagnostic tests  x-rays which he states showed bone spurs    Patient Stated Goals  reduce the pain,  get rid of it if we can    Currently in Pain?  Yes    Pain Score  2     Pain Location  Neck    Pain Orientation  Left    Pain Descriptors / Indicators  -- "hurts when I move"    Pain Type  Chronic pain    Pain Onset  More than a month ago    Pain Frequency  Intermittent    Aggravating Factors   looking up at TV for a long time, looking left and right    Pain Relieving Factors  tries to stay busy so he won't get the pain    Effect of Pain on Daily Activities  increases         [facility] PT Assessment - 08/04/17 0001      Assessment   Medical Diagnosis  cervical spondylosis with radiculopathy    Referring Provider  Kevan Ny Ditty, MD    Onset Date/Surgical Date  08/04/16    Next MD Visit  doesn't have one currently    Prior Therapy  OT for bil shoulder replacements in 2017; PT for neck pain in early 2018      Balance Screen   Has the patient fallen in the past 6  months  No    Has the patient had a decrease in activity level because of a fear of falling?   Yes    Is the patient reluctant to leave their home because of a fear of falling?   No      Prior Function   Leisure  goes to [employer] center, plays golf, plays tennis      Observation/Other Assessments   Focus on Therapeutic Outcomes (FOTO)   47% limitation      Posture/Postural Control   Posture/Postural Control  Postural limitations    Postural Limitations  Rounded Shoulders;Forward head;Increased thoracic kyphosis      ROM / Strength   AROM / PROM / Strength  AROM;Strength      AROM   AROM Assessment Site  Cervical    Cervical Flexion  46    Cervical Extension  20 L neck pain    Cervical - Right Side Bend  17 L neck pain    Cervical - Left Side Bend  12 L neck pain    Cervical - Right Rotation  45 L neck pain    Cervical - Left Rotation  52 slight L sided neck pain      Strength   Strength Assessment Site  Shoulder;Elbow;Wrist;Hand    Right Shoulder Flexion  4+/5    Right Shoulder ABduction  4+/5    Right Shoulder Internal Rotation  4+/5    Right Shoulder External Rotation  4+/5    Left Shoulder Flexion  4+/5    Left Shoulder ABduction  4+/5    Left Shoulder Internal Rotation  4+/5    Left Shoulder External Rotation  4+/5    Right Hand Gross Grasp  Functional    Left Hand Gross Grasp  Functional      Palpation   Spinal mobility  cervical and upper thoracic spine hypomobile; tenderness to CPAs at C2-3    Palpation comment  multiple palpable trigger points in pt's L cervical extensors, L UT, L levator scap, and L SCM, which pt reported recreated his pain with palpation      Special Tests    Special Tests  Cervical    Cervical Tests  Spurling's;Dictraction  Spurling's   Findings  Negative    Side  Left    Comment  neg bil; pt did have neck pain when testing both R and L sides, but he did not have any radicular symptoms      Distraction Test   Findngs  Positive     Comment  no pain with distraction          Objective measurements completed on examination: See above findings.       PT Education - 08/04/17 1612    Education provided  Yes    Education Details  exam findings, POC, HEP    Person(s) Educated  Patient    Methods  Explanation;Demonstration;Handout    Comprehension  Verbalized understanding;Returned demonstration       PT Short Term Goals - 08/04/17 1637      PT SHORT TERM GOAL #1   Title  Pt will be independent with HEP and perform consistently in order to decrease pain and maximize overall function.    Time  3    Period  Weeks    Status  New    Target Date  08/25/17      PT SHORT TERM GOAL #2   Title  Pt will have improved cervical AROM by 10 deg throughout in order to decrease pain and maximize his ability to drive.    Time  3    Period  Weeks    Status  New        PT Long Term Goals - 08/04/17 1639      PT LONG TERM GOAL #1   Title  Pt will have improved AROM in cervical flexion, extension, R/L rotation by 20 deg or > throughout and with no L sided neck pain to maximize his ability to watch Western movies with more comfort and allow him to drive with greater ease.    Time  6    Period  Weeks    Status  New    Target Date  09/15/17      PT LONG TERM GOAL #2   Title  Pt will report min to no pain with palpation of cervical and periscapular musculature to demo improved soft tissue restrictions and decrease pain.     Time  6    Period  Weeks    Status  New      PT LONG TERM GOAL #3   Title  Pt will be able to attain and maintain proper sitting posture at least 75% of the time to demo improved overall posture, improve his overall cervical alignment, and decrease pt's neck pain.    Time  6    Period  Weeks    Status  New             Plan - 08/04/17 1631    Clinical Impression Statement  Pt is pleasant 83 YO M who presents to OPPT with c/o L sided neck pain which started 1 year ago of insidious onset.  Pt currently presents with deficits in cervical AROM, mild deficits in BUE strength, deficits in posture, and increased soft tissue restrictions of cervical musculature. Pt with palpable trigger points throughout L cervical extensors, upper trap, levator scap, and SCM and he reported recreation of pain with palpation throughout. Pt also noted to have improved ROM with PROM in supine indicating pt's pain is muscular in nature. Pt brought in note from his referring physician that stated "please perform cervical traction," however, this PT recommends delaying mechanical  traction at this time as he does not have any radicular symptoms down either UE and he was able to pinpoint his c/o pain in those same muscles that were noted above. Traction is still in the POC and can be performed if pt is not responding well to manual therapy or other therex. PT did discuss with pt the potential for dry needling and its benefits since he presented with active trigger points and pt verbalized understanding. Pt needs skilled PT intervention to address these deficits in order to maximize overall function and decrease pain.    History and Personal Factors relevant to plan of care:  active, motivated to participate with PT; chronicity of issue; HTN, arthritis, GERD, h/o bil TSA    Clinical Presentation  Stable    Clinical Presentation due to:  AROM, MMT, soft tissue restrictions, posture    Clinical Decision Making  Low    Rehab Potential  Good    PT Frequency  2x / week    PT Duration  6 weeks    PT Treatment/Interventions  ADLs/Self Care Home Management;Cryotherapy;Electrical Stimulation;Moist Heat;Traction;Therapeutic activities;Therapeutic exercise;Neuromuscular re-education;Patient/family education;Manual techniques;Passive range of motion;Dry needling;Energy conservation;Taping;Vestibular    PT Next Visit Plan  review goals and HEP (trial retrations in supine); manual for soft tissue restrictions and active trigger points,  potentially joint mobilizations for hypomobility; postural education and strengthening; PROM in supine, stretching    PT Home Exercise Plan  eval: seated cervical retractions    Consulted and Agree with Plan of Care  Patient       Patient will benefit from skilled therapeutic intervention in order to improve the following deficits and impairments:  Decreased activity tolerance, Decreased endurance, Decreased mobility, Decreased range of motion, Decreased strength, Hypomobility, Increased fascial restricitons, Increased muscle spasms, Impaired flexibility, Impaired UE functional use, Improper body mechanics, Postural dysfunction, Pain  Visit Diagnosis: Cervicalgia - Plan: PT plan of care cert/re-cert  Abnormal posture - Plan: PT plan of care cert/re-cert  Other muscle spasm - Plan: PT plan of care cert/re-cert     Problem List Patient Active Problem List   Diagnosis Date Noted  . S/P shoulder replacement 08/29/2012  . Pain in joint, shoulder region 08/29/2012  . Muscle weakness (generalized) 08/29/2012  . Arthritis of shoulder 08/12/2012       Geraldine Solar PT, DPT  [facility] [address] [place], [place], [address] Phone: [phone]   Fax:  [phone]  Name: Scott Zamora MRN: [id number] Date of Birth: 1934/10/02

## 2017-08-06 ENCOUNTER — Encounter (HOSPITAL_COMMUNITY): Payer: Medicare Other

## 2017-08-10 ENCOUNTER — Ambulatory Visit (HOSPITAL_COMMUNITY): Payer: Medicare Other

## 2017-08-10 DIAGNOSIS — M62838 Other muscle spasm: Secondary | ICD-10-CM | POA: Diagnosis not present

## 2017-08-10 DIAGNOSIS — M542 Cervicalgia: Secondary | ICD-10-CM

## 2017-08-10 DIAGNOSIS — R293 Abnormal posture: Secondary | ICD-10-CM

## 2017-08-10 NOTE — Therapy (Signed)
[place], [place], [address] Phone: [phone]   Fax:  [phone]  Physical Therapy Treatment  Patient Details  Name: Scott Zamora MRN: [id number] Date of Birth: 02/11/1935 Referring Provider: Tamala Fothergill, MD   Encounter Date: 08/10/2017  PT End of Session - 08/10/17 0942    Visit Number  2    Number of Visits  13    Date for PT Re-Evaluation  08/25/17    Authorization Type  Medicare Part A and B; Secondary: [facility]; Tertiary: [employer]    Authorization Time Period  08/04/17 to 09/15/17    PT Start Time  0900    PT Stop Time  0945    PT Time Calculation (min)  45 min    Activity Tolerance  Patient tolerated treatment well;No increased pain    Behavior During Therapy  WFL for tasks assessed/performed       Past Medical History:  Diagnosis Date  . Arthritis   . Dry skin   . GERD (gastroesophageal reflux disease)   . Hypertension   . Inguinal hernia, left   . Seasonal allergies   . Sleep apnea    has CPAP but does not wear it  . Vertigo    hx of    Past Surgical History:  Procedure Laterality Date  . COLONOSCOPY W/ POLYPECTOMY    . EYE SURGERY Bilateral   . HERNIA REPAIR    . INGUINAL HERNIA REPAIR Left 04/18/2014   Procedure: HERNIA REPAIR INGUINAL ADULT;  Surgeon: Jamesetta So, MD;  Location: [facility];  Service: General;  Laterality: Left;  . INSERTION OF MESH Left 04/18/2014   Procedure: INSERTION OF MESH;  Surgeon: Jamesetta So, MD;  Location: [facility];  Service: General;  Laterality: Left;  . KNEE ARTHROSCOPY     left knee  . TOTAL SHOULDER ARTHROPLASTY  08/11/2012   Dr Justice Britain  . TOTAL SHOULDER ARTHROPLASTY  08/11/2012   Procedure: TOTAL SHOULDER ARTHROPLASTY;  Surgeon: Marin Shutter, MD;  Location: [facility];  Service: Orthopedics;  Laterality: Left;  . TOTAL SHOULDER ARTHROPLASTY Right 09/12/2015   Procedure: RIGHT TOTAL SHOULDER ARTHROPLASTY;  Surgeon: Justice Britain, MD;  Location: [facility];  Service: Orthopedics;  Laterality: Right;    There were no vitals filed for this visit.  Subjective Assessment - 08/10/17 0904    Subjective  Pt is doing ok today. he reports a slight increase in the left lower neck after last session, nothing in particular. His pain is noticed mostly at rest. He is unsure if his HEP activity is performed correctly.     Currently in Pain?  Yes    Pain Score  4     Pain Orientation  Left    Pain Descriptors / Indicators  Aching along the mid belly Left upper traps                      [facility] Adult PT Treatment/Exercise - 08/10/17 0001      Exercises   Exercises  Neck      Neck Exercises: Seated   Shoulder Shrugs  15 reps x3sec    Shoulder Shrugs Limitations  retraction 10x3secH     Shoulder Rolls  10 reps      Manual Therapy   Manual Therapy  Myofascial release;Joint mobilization    Joint Mobilization  Lt C0/C1: mobilization 2x30sec, pain improves with time Left C1/2 medial glide:  1x30sec, pain improves with time    Myofascial Release  UT, Levator: 25 min pain improves with time       Trigger Point Dry Needling - 08/10/17 0923    Consent Given?  Yes    Education Handout Provided  Yes    Muscles Treated Upper Body  Upper trapezius;Levator scapulae    Upper Trapezius Response  Twitch reponse elicited;Palpable increased muscle length less than 3 minutes    Levator Scapulae Response  Twitch response elicited;Palpable increased muscle length           PT Education - 08/10/17 0942    Education provided  Yes    Education Details  TPDN and typical resposne    Person(s) Educated  Patient    Methods  Explanation;Demonstration;Handout    Comprehension  Need further instruction       PT Short Term Goals - 08/04/17 1637      PT SHORT TERM GOAL #1   Title  Pt will be independent with HEP and perform consistently in order to decrease pain and maximize overall function.    Time  3    Period  Weeks     Status  New    Target Date  08/25/17      PT SHORT TERM GOAL #2   Title  Pt will have improved cervical AROM by 10 deg throughout in order to decrease pain and maximize his ability to drive.    Time  3    Period  Weeks    Status  New        PT Long Term Goals - 08/04/17 1639      PT LONG TERM GOAL #1   Title  Pt will have improved AROM in cervical flexion, extension, R/L rotation by 20 deg or > throughout and with no L sided neck pain to maximize his ability to watch Western movies with more comfort and allow him to drive with greater ease.    Time  6    Period  Weeks    Status  New    Target Date  09/15/17      PT LONG TERM GOAL #2   Title  Pt will report min to no pain with palpation of cervical and periscapular musculature to demo improved soft tissue restrictions and decrease pain.     Time  6    Period  Weeks    Status  New      PT LONG TERM GOAL #3   Title  Pt will be able to attain and maintain proper sitting posture at least 75% of the time to demo improved overall posture, improve his overall cervical alignment, and decrease pt's neck pain.    Time  6    Period  Weeks    Status  New            Plan - 08/10/17 2355    Clinical Impression Statement  Pt educated on dry needling and trialed today, with good response to treatment, aggravation of CC adn resolution of myofascial pain. Heavy use of MFR to Lt upper trap and levator. C0/C1 responding more to joint mobilization than needling. Pt very fatigued with seated Scap Ts in flexed posture. Pt making good progress toward goasl overall. Pain is decreased after session from 4/10 with Right head turns to "muc less."     Rehab Potential  Good    PT Frequency  2x / week    PT Duration  6 weeks  PT Treatment/Interventions  ADLs/Self Care Home Management;Cryotherapy;Electrical Stimulation;Moist Heat;Traction;Therapeutic activities;Therapeutic exercise;Neuromuscular re-education;Patient/family education;Manual  techniques;Passive range of motion;Dry needling;Energy conservation;Taping;Vestibular    PT Next Visit Plan  review goals and HEP (trial retrations in supine); FU on resposne to dry needling adn MFR last session. Consider continuation of C0/C1, C1/C2 joint mobilization; Trial of barrel hug Ut stretch, and add in Seated Scap Ts in forward flexion.     PT Home Exercise Plan  eval: seated cervical retractions       Patient will benefit from skilled therapeutic intervention in order to improve the following deficits and impairments:  Decreased activity tolerance, Decreased endurance, Decreased mobility, Decreased range of motion, Decreased strength, Hypomobility, Increased fascial restricitons, Increased muscle spasms, Impaired flexibility, Impaired UE functional use, Improper body mechanics, Postural dysfunction, Pain  Visit Diagnosis: Cervicalgia  Abnormal posture  Other muscle spasm     Problem List Patient Active Problem List   Diagnosis Date Noted  . S/P shoulder replacement 08/29/2012  . Pain in joint, shoulder region 08/29/2012  . Muscle weakness (generalized) 08/29/2012  . Arthritis of shoulder 08/12/2012   9:49 AM, 08/10/17 Etta Grandchild, PT, DPT Physical Therapist - [facility] [phone] (Office)   Etta Grandchild 08/10/2017, 9:49 AM  [facility] [address] [place], [place], [address] Phone: [phone]   Fax:  [phone]  Name: Scott Zamora MRN: [id number] Date of Birth: 1935-02-19

## 2017-08-10 NOTE — Patient Instructions (Signed)

## 2017-08-11 ENCOUNTER — Encounter (HOSPITAL_COMMUNITY): Payer: Medicare Other

## 2017-08-12 ENCOUNTER — Encounter (HOSPITAL_COMMUNITY): Payer: Medicare Other

## 2017-08-13 ENCOUNTER — Encounter (HOSPITAL_COMMUNITY): Payer: Self-pay | Admitting: Physical Therapy

## 2017-08-13 ENCOUNTER — Ambulatory Visit (HOSPITAL_COMMUNITY): Payer: Medicare Other | Attending: Neurological Surgery | Admitting: Physical Therapy

## 2017-08-13 ENCOUNTER — Other Ambulatory Visit: Payer: Self-pay

## 2017-08-13 DIAGNOSIS — M542 Cervicalgia: Secondary | ICD-10-CM | POA: Diagnosis not present

## 2017-08-13 DIAGNOSIS — R293 Abnormal posture: Secondary | ICD-10-CM

## 2017-08-13 DIAGNOSIS — M62838 Other muscle spasm: Secondary | ICD-10-CM | POA: Insufficient documentation

## 2017-08-13 NOTE — Patient Instructions (Signed)
  Upper Trapezius Stretch Sit tall. Hold on with one hand under your chair. Lean the opposite direction until your shoulder is anchored. Gently move the opposite ear toward the opposite shoulder until a comfortable stretch is felt in the upper shoulder area. The head can be moved forward from this position. The stretch should be held in whatever position provides the best stretch. This can also be done standing and holding on to any stable object.  Repeat 3 Times Hold 30 Seconds Complete 1 Set Perform 2 Time(s) a Day

## 2017-08-13 NOTE — Therapy (Signed)
[place], [place], [address] Phone: [phone]   Fax:  [phone]  Physical Therapy Treatment  Patient Details  Name: Scott Zamora MRN: [id number] Date of Birth: 04-Aug-1934 Referring Provider: Tamala Fothergill, MD   Encounter Date: 08/13/2017  PT End of Session - 08/13/17 2018    Visit Number  3    Number of Visits  13    Date for PT Re-Evaluation  08/25/17    Authorization Type  Medicare Part A and B; Secondary: [facility]; Tertiary: [employer]    Authorization Time Period  08/04/17 to 09/15/17    PT Start Time  0948    PT Stop Time  1030    PT Time Calculation (min)  42 min    Activity Tolerance  Patient tolerated treatment well;No increased pain    Behavior During Therapy  WFL for tasks assessed/performed       Past Medical History:  Diagnosis Date  . Arthritis   . Dry skin   . GERD (gastroesophageal reflux disease)   . Hypertension   . Inguinal hernia, left   . Seasonal allergies   . Sleep apnea    has CPAP but does not wear it  . Vertigo    hx of    Past Surgical History:  Procedure Laterality Date  . COLONOSCOPY W/ POLYPECTOMY    . EYE SURGERY Bilateral   . HERNIA REPAIR    . INGUINAL HERNIA REPAIR Left 04/18/2014   Procedure: HERNIA REPAIR INGUINAL ADULT;  Surgeon: Jamesetta So, MD;  Location: [facility];  Service: General;  Laterality: Left;  . INSERTION OF MESH Left 04/18/2014   Procedure: INSERTION OF MESH;  Surgeon: Jamesetta So, MD;  Location: [facility];  Service: General;  Laterality: Left;  . KNEE ARTHROSCOPY     left knee  . TOTAL SHOULDER ARTHROPLASTY  08/11/2012   Dr Justice Britain  . TOTAL SHOULDER ARTHROPLASTY  08/11/2012   Procedure: TOTAL SHOULDER ARTHROPLASTY;  Surgeon: Marin Shutter, MD;  Location: [place];  Service: Orthopedics;  Laterality: Left;  . TOTAL SHOULDER ARTHROPLASTY Right 09/12/2015   Procedure: RIGHT TOTAL SHOULDER ARTHROPLASTY;  Surgeon: Justice Britain, MD;  Location: [place];  Service: Orthopedics;  Laterality: Right;    There were no vitals filed for this visit.  Subjective Assessment - 08/13/17 2012    Subjective  Patient reported that he is doing well today. He reported that he is not having any pain today, but that last night he had a pain of 7/10 in the left side of his neck. Patient stated that he thinks dry needling has helped reduce his pain.     Currently in Pain?  No/denies                      [facility] Adult PT Treatment/Exercise - 08/13/17 0001      Neck Exercises: Seated   Neck Retraction  10 reps;Limitations    Neck Retraction Limitations  retraction 10x3sec hold     Cervical Rotation  Both;10 reps    Shoulder Shrugs  15 reps x3 second holds    Shoulder Shrugs Limitations  --    Shoulder Rolls  10 reps;Backwards    Shoulder Flexion  Both;10 reps;Other (comment) 3 pound bar weight x 10 reps. Emphasis on good posture    Other Seated Exercise  --      Manual Therapy   Manual Therapy  Soft tissue mobilization;Passive ROM    Manual therapy comments  Manual therapy completed separate from other skilled interventions to patient's comfort; totaling 15 minutes    Soft tissue mobilization  Performed with patient supine with bolster under knees. Soft tissue mobilization goal to promote relaxation and reduce tightness to bilateral trapezius and levator scapulae.    Passive ROM  With patient in supine and bolster under knees for comfort. Therapist provided passive range of motion into cervical and capital rotation, as well as cervical lateral flexion bilaterally       Neck Exercises: Stretches   Upper Trapezius Stretch  Left;3 reps;30 seconds;Other (comment) Seated with right arm stabilized under mat table              PT Education - 08/13/17 2017    Education provided  Yes    Education Details  Patient was educated on purpose and technique of all exercises, also reviewed patient's home exercise program,  and evaluation, and goals.     Person(s) Educated  Patient    Methods  Explanation;Demonstration;Handout    Comprehension  Verbalized understanding;Need further instruction       PT Short Term Goals - 08/04/17 1637      PT SHORT TERM GOAL #1   Title  Pt will be independent with HEP and perform consistently in order to decrease pain and maximize overall function.    Time  3    Period  Weeks    Status  New    Target Date  08/25/17      PT SHORT TERM GOAL #2   Title  Pt will have improved cervical AROM by 10 deg throughout in order to decrease pain and maximize his ability to drive.    Time  3    Period  Weeks    Status  New        PT Long Term Goals - 08/04/17 1639      PT LONG TERM GOAL #1   Title  Pt will have improved AROM in cervical flexion, extension, R/L rotation by 20 deg or > throughout and with no L sided neck pain to maximize his ability to watch Western movies with more comfort and allow him to drive with greater ease.    Time  6    Period  Weeks    Status  New    Target Date  09/15/17      PT LONG TERM GOAL #2   Title  Pt will report min to no pain with palpation of cervical and periscapular musculature to demo improved soft tissue restrictions and decrease pain.     Time  6    Period  Weeks    Status  New      PT LONG TERM GOAL #3   Title  Pt will be able to attain and maintain proper sitting posture at least 75% of the time to demo improved overall posture, improve his overall cervical alignment, and decrease pt's neck pain.    Time  6    Period  Weeks    Status  New            Plan - 08/13/17 1609    Clinical Impression Statement  This session patient reported that he is not having any pain. Patient reported that he felt dry needling helped reduce his pain. The session initially began by reviewing the patient's evaluation, goals, and home exercise program. Then the session progressed with therapist providing manual therapy with an  emphasis on  promoting relaxation of muscles and improving patient's cervical range of motion through passive range of motion. Session also included exercises in order to improve patient's range of motion and strength. This session therapist added a trapezius seated self stretch to patient's home exercise program. Patient was provided verbal and tactile cueing throughout exercises for proper form. Patient would benefit from continued reinforcement of proper posture and technique of exercises. Patient would continue to benefit from skilled physical therapy in order to continue addressing patient's progress with range of motion, strength, and overall functional mobility.     Rehab Potential  Good    PT Frequency  2x / week    PT Duration  6 weeks    PT Treatment/Interventions  ADLs/Self Care Home Management;Cryotherapy;Electrical Stimulation;Moist Heat;Traction;Therapeutic activities;Therapeutic exercise;Neuromuscular re-education;Patient/family education;Manual techniques;Passive range of motion;Dry needling;Energy conservation;Taping;Vestibular    PT Next Visit Plan  Trial retrations in supine; Consider continuation of C0/C1, C1/C2 joint mobilization; Trial of barrel hug Ut stretch, and add in Seated Scap Ts in forward flexion.     PT Home Exercise Plan  eval: seated cervical retractions; 08/13/17: Added trapezius seated self stretch 3 x 30 seconds 2x/day    Consulted and Agree with Plan of Care  Patient       Patient will benefit from skilled therapeutic intervention in order to improve the following deficits and impairments:  Decreased activity tolerance, Decreased endurance, Decreased mobility, Decreased range of motion, Decreased strength, Hypomobility, Increased fascial restricitons, Increased muscle spasms, Impaired flexibility, Impaired UE functional use, Improper body mechanics, Postural dysfunction, Pain  Visit Diagnosis: Cervicalgia  Abnormal posture  Other muscle spasm     Problem List Patient  Active Problem List   Diagnosis Date Noted  . S/P shoulder replacement 08/29/2012  . Pain in joint, shoulder region 08/29/2012  . Muscle weakness (generalized) 08/29/2012  . Arthritis of shoulder 08/12/2012   Clarene Critchley PT, DPT 8:46 PM, 08/13/17 [place], [place], [address] Phone: [phone]   Fax:  [phone]  Name: SIRRON FRANCESCONI MRN: [id number] Date of Birth: 03/31/1935

## 2017-08-18 ENCOUNTER — Ambulatory Visit (HOSPITAL_COMMUNITY): Payer: Medicare Other

## 2017-08-18 ENCOUNTER — Encounter (HOSPITAL_COMMUNITY): Payer: Self-pay

## 2017-08-18 DIAGNOSIS — R293 Abnormal posture: Secondary | ICD-10-CM | POA: Diagnosis not present

## 2017-08-18 DIAGNOSIS — M62838 Other muscle spasm: Secondary | ICD-10-CM

## 2017-08-18 DIAGNOSIS — M542 Cervicalgia: Secondary | ICD-10-CM | POA: Diagnosis not present

## 2017-08-18 NOTE — Patient Instructions (Signed)
  Levator Scap Stretch   Sitting upright, tilt your head downward and towards your armpit. Hold once you feel a gentle stretch. Perform on both sides.   Perform 1x/day, 3-5 stretches holding for 30-60 seconds

## 2017-08-18 NOTE — Therapy (Signed)
[place], [place], [address] Phone: [phone]   Fax:  [phone]  Physical Therapy Treatment  Patient Details  Name: Scott Zamora MRN: [id number] Date of Birth: January 07, 1935 Referring Provider: Tamala Fothergill, MD   Encounter Date: 08/18/2017  PT End of Session - 08/18/17 0903    Visit Number  4    Number of Visits  13    Date for PT Re-Evaluation  08/25/17    Authorization Type  Medicare Part A and B; Secondary: [facility]; Tertiary: [employer]    Authorization Time Period  08/04/17 to 09/15/17    PT Start Time  0901    PT Stop Time  0940    PT Time Calculation (min)  39 min    Activity Tolerance  Patient tolerated treatment well;No increased pain    Behavior During Therapy  WFL for tasks assessed/performed       Past Medical History:  Diagnosis Date  . Arthritis   . Dry skin   . GERD (gastroesophageal reflux disease)   . Hypertension   . Inguinal hernia, left   . Seasonal allergies   . Sleep apnea    has CPAP but does not wear it  . Vertigo    hx of    Past Surgical History:  Procedure Laterality Date  . COLONOSCOPY W/ POLYPECTOMY    . EYE SURGERY Bilateral   . HERNIA REPAIR    . INGUINAL HERNIA REPAIR Left 04/18/2014   Procedure: HERNIA REPAIR INGUINAL ADULT;  Surgeon: Jamesetta So, MD;  [facility];  Service: General;  Laterality: Left;  . INSERTION OF MESH Left 04/18/2014   Procedure: INSERTION OF MESH;  Surgeon: Jamesetta So, MD;  Location: [facility];  Service: General;  Laterality: Left;  . KNEE ARTHROSCOPY     left knee  . TOTAL SHOULDER ARTHROPLASTY  08/11/2012   Dr Justice Britain  . TOTAL SHOULDER ARTHROPLASTY  08/11/2012   Procedure: TOTAL SHOULDER ARTHROPLASTY;  Surgeon: Marin Shutter, MD;  Location: [place];  Service: Orthopedics;  Laterality: Left;  . TOTAL SHOULDER ARTHROPLASTY Right 09/12/2015   Procedure: RIGHT TOTAL SHOULDER ARTHROPLASTY;  Surgeon: Justice Britain, MD;  Location: [place];  Service: Orthopedics;  Laterality: Right;    There were no vitals filed for this visit.  Subjective Assessment - 08/18/17 0902    Subjective  Pt states that he is doing well. He states that he had a lot of pain following his last session but he hasn't really had it since. He had some pain yesterday, but overall he has been feeling better.    Currently in Pain?  No/denies           [facility] Adult PT Treatment/Exercise - 08/18/17 0001      Neck Exercises: Machines for Strengthening   UBE (Upper Arm Bike)  x3 mins, L2, retro for postural strengthening      Neck Exercises: Theraband   Scapula Retraction  10 reps;Green    Scapula Retraction Limitations  2 sets + cervical retraction    Shoulder Extension  10 reps;Green    Shoulder Extension Limitations  2 sets + cervical retraction    Other Theraband Exercises  bear hugs with GTB 2x10    Other Theraband Exercises  inverted Y's on wall 15x3sec holds; Y's on wall with lift off x10 reps      Neck Exercises: Seated   Neck Retraction Limitations  15x3sec hold  Manual Therapy   Manual Therapy  Soft tissue mobilization    Manual therapy comments  Manual therapy completed separate from other skilled interventions to patient's comfort; totaling 15 minutes    Soft tissue mobilization  in supine: STM to L levator scap, UT, cervical paraspinals      Neck Exercises: Stretches   Upper Trapezius Stretch  Left;3 reps;30 seconds    Levator Stretch  Left;3 reps;30 seconds          PT Education - 08/18/17 0903    Education provided  Yes    Education Details  exercise technique    Person(s) Educated  Patient    Methods  Explanation    Comprehension  Verbalized understanding       PT Short Term Goals - 08/04/17 1637      PT SHORT TERM GOAL #1   Title  Pt will be independent with HEP and perform consistently in order to decrease pain and maximize overall function.    Time  3    Period  Weeks    Status   New    Target Date  08/25/17      PT SHORT TERM GOAL #2   Title  Pt will have improved cervical AROM by 10 deg throughout in order to decrease pain and maximize his ability to drive.    Time  3    Period  Weeks    Status  New        PT Long Term Goals - 08/04/17 1639      PT LONG TERM GOAL #1   Title  Pt will have improved AROM in cervical flexion, extension, R/L rotation by 20 deg or > throughout and with no L sided neck pain to maximize his ability to watch Western movies with more comfort and allow him to drive with greater ease.    Time  6    Period  Weeks    Status  New    Target Date  09/15/17      PT LONG TERM GOAL #2   Title  Pt will report min to no pain with palpation of cervical and periscapular musculature to demo improved soft tissue restrictions and decrease pain.     Time  6    Period  Weeks    Status  New      PT LONG TERM GOAL #3   Title  Pt will be able to attain and maintain proper sitting posture at least 75% of the time to demo improved overall posture, improve his overall cervical alignment, and decrease pt's neck pain.    Time  6    Period  Weeks    Status  New            Plan - 08/18/17 0941    Clinical Impression Statement  Continued with established POC focusing on scapular strengthening and stabilization along with address restrictions in soft tissue on L side of neck. Pt reporting overall that he is improving and pt is making good strides towards goals. Ended with manual therapy and pt continues with increased restrictions of periscapular musculature, especially of levator scap. Continue as planned, progressing as able.    Rehab Potential  Good    PT Frequency  2x / week    PT Duration  6 weeks    PT Treatment/Interventions  ADLs/Self Care Home Management;Cryotherapy;Electrical Stimulation;Moist Heat;Traction;Therapeutic activities;Therapeutic exercise;Neuromuscular re-education;Patient/family education;Manual techniques;Passive range of  motion;Dry needling;Energy conservation;Taping;Vestibular    PT Next Visit Plan  Trial retrations in supine; Consider continuation of C0/C1, C1/C2 joint mobilization; continue scap strengthening and stabilization; give pt levator scap stretch for HEP next visit (therapist forgot to give to pt)    PT Home Exercise Plan  eval: seated cervical retractions; 08/13/17: Added trapezius seated self stretch 3 x 30 seconds 2x/day    Consulted and Agree with Plan of Care  Patient       Patient will benefit from skilled therapeutic intervention in order to improve the following deficits and impairments:  Decreased activity tolerance, Decreased endurance, Decreased mobility, Decreased range of motion, Decreased strength, Hypomobility, Increased fascial restricitons, Increased muscle spasms, Impaired flexibility, Impaired UE functional use, Improper body mechanics, Postural dysfunction, Pain  Visit Diagnosis: Cervicalgia  Abnormal posture  Other muscle spasm     Problem List Patient Active Problem List   Diagnosis Date Noted  . S/P shoulder replacement 08/29/2012  . Pain in joint, shoulder region 08/29/2012  . Muscle weakness (generalized) 08/29/2012  . Arthritis of shoulder 08/12/2012       Geraldine Solar PT, DPT  [place] [address] [place], [place], [address] Phone: [phone]   Fax:  [phone]  Name: Scott Zamora MRN: [id number] Date of Birth: 1935/05/26

## 2017-08-20 ENCOUNTER — Ambulatory Visit (HOSPITAL_COMMUNITY): Payer: Medicare Other

## 2017-08-20 ENCOUNTER — Encounter (HOSPITAL_COMMUNITY): Payer: Self-pay

## 2017-08-20 DIAGNOSIS — M62838 Other muscle spasm: Secondary | ICD-10-CM

## 2017-08-20 DIAGNOSIS — R293 Abnormal posture: Secondary | ICD-10-CM

## 2017-08-20 DIAGNOSIS — M542 Cervicalgia: Secondary | ICD-10-CM

## 2017-08-20 NOTE — Therapy (Signed)
[place], [place], [address] Phone: [phone]   Fax:  [phone]  Physical Therapy Treatment  Patient Details  Name: Scott Zamora MRN: [id number] Date of Birth: 1934-08-23 Referring Provider: Tamala Fothergill, MD   Encounter Date: 08/20/2017  PT End of Session - 08/20/17 0900    Visit Number  5    Number of Visits  13    Date for PT Re-Evaluation  08/25/17    Authorization Type  Medicare Part A and B; Secondary: [facility]; Tertiary: [employer]    Authorization Time Period  08/04/17 to 09/15/17    PT Start Time  0858    PT Stop Time  0940    PT Time Calculation (min)  42 min    Activity Tolerance  Patient tolerated treatment well;No increased pain    Behavior During Therapy  WFL for tasks assessed/performed       Past Medical History:  Diagnosis Date  . Arthritis   . Dry skin   . GERD (gastroesophageal reflux disease)   . Hypertension   . Inguinal hernia, left   . Seasonal allergies   . Sleep apnea    has CPAP but does not wear it  . Vertigo    hx of    Past Surgical History:  Procedure Laterality Date  . COLONOSCOPY W/ POLYPECTOMY    . EYE SURGERY Bilateral   . HERNIA REPAIR    . INGUINAL HERNIA REPAIR Left 04/18/2014   Procedure: HERNIA REPAIR INGUINAL ADULT;  Surgeon: Jamesetta So, MD;  [facility];  Service: General;  Laterality: Left;  . INSERTION OF MESH Left 04/18/2014   Procedure: INSERTION OF MESH;  Surgeon: Jamesetta So, MD;  Location[facility];  Service: General;  Laterality: Left;  . KNEE ARTHROSCOPY     left knee  . TOTAL SHOULDER ARTHROPLASTY  08/11/2012   Dr Justice Britain  . TOTAL SHOULDER ARTHROPLASTY  08/11/2012   Procedure: TOTAL SHOULDER ARTHROPLASTY;  Surgeon: Marin Shutter, MD;  Location: [place];  Service: Orthopedics;  Laterality: Left;  . TOTAL SHOULDER ARTHROPLASTY Right 09/12/2015   Procedure: RIGHT TOTAL SHOULDER ARTHROPLASTY;  Surgeon: Justice Britain, MD;  Location: [place];  Service: Orthopedics;  Laterality: Right;    There were no vitals filed for this visit.  Subjective Assessment - 08/20/17 0900    Subjective  Pt reports that he is not in any pain currently. He had some neck pain last night but it was not as bad.     Currently in Pain?  No/denies         [facility] PT Assessment - 08/20/17 0001      AROM   Cervical Flexion  46    Cervical Extension  30    Cervical - Right Side Bend  23    Cervical - Left Side Bend  23    Cervical - Right Rotation  61    Cervical - Left Rotation  63        [facility] Adult PT Treatment/Exercise - 08/20/17 0001      Neck Exercises: Machines for Strengthening   UBE (Upper Arm Bike)  x3 mins, L2, retro for postural strengthening      Neck Exercises: Theraband   Scapula Retraction  10 reps;Green    Scapula Retraction Limitations  2 sets + cervical retraction    Shoulder Extension  10 reps;Green    Shoulder Extension Limitations  2  sets + cervical retraction    Other Theraband Exercises  D2 flexion with GTB x10 reps each; bear hugs with GTB 2x10      Neck Exercises: Standing   Wall Push Ups  10 reps    Other Standing Exercises  Y's on wall with liftoff x10 reps    Other Standing Exercises  shoulder taps in modified plantigrade x10 reps each      Neck Exercises: Supine   Other Supine Exercise  serratus punches 5# DB x10 reps each      Manual Therapy   Manual Therapy  Soft tissue mobilization    Manual therapy comments  Manual therapy completed separate from other skilled interventions to patient's comfort; totaling 15 minutes    Soft tissue mobilization  in prone: L levator scap, UT, cervical paraspinals      Neck Exercises: Stretches   Upper Trapezius Stretch  Left;3 reps;30 seconds    Levator Stretch  Left;3 reps;30 seconds          PT Education - 08/20/17 0900    Education provided  Yes    Education Details  updated HEP to include levator scap stretch    Person(s) Educated   Patient    Methods  Explanation;Demonstration;Handout    Comprehension  Verbalized understanding;Returned demonstration       PT Short Term Goals - 08/04/17 1637      PT SHORT TERM GOAL #1   Title  Pt will be independent with HEP and perform consistently in order to decrease pain and maximize overall function.    Time  3    Period  Weeks    Status  New    Target Date  08/25/17      PT SHORT TERM GOAL #2   Title  Pt will have improved cervical AROM by 10 deg throughout in order to decrease pain and maximize his ability to drive.    Time  3    Period  Weeks    Status  New        PT Long Term Goals - 08/04/17 1639      PT LONG TERM GOAL #1   Title  Pt will have improved AROM in cervical flexion, extension, R/L rotation by 20 deg or > throughout and with no L sided neck pain to maximize his ability to watch Western movies with more comfort and allow him to drive with greater ease.    Time  6    Period  Weeks    Status  New    Target Date  09/15/17      PT LONG TERM GOAL #2   Title  Pt will report min to no pain with palpation of cervical and periscapular musculature to demo improved soft tissue restrictions and decrease pain.     Time  6    Period  Weeks    Status  New      PT LONG TERM GOAL #3   Title  Pt will be able to attain and maintain proper sitting posture at least 75% of the time to demo improved overall posture, improve his overall cervical alignment, and decrease pt's neck pain.    Time  6    Period  Weeks    Status  New            Plan - 08/20/17 0940    Clinical Impression Statement  Progressed pt's scap stab and strengthening this date. Ended with manual to levator scap, UT, and cervical  paraspinals. Pt continues with palpable knots throughout these areas but they are improving. PT checked pt's AROM this date and he has made great improvements. Continue as planned.    Rehab Potential  Good    PT Frequency  2x / week    PT Duration  6 weeks    PT  Treatment/Interventions  ADLs/Self Care Home Management;Cryotherapy;Electrical Stimulation;Moist Heat;Traction;Therapeutic activities;Therapeutic exercise;Neuromuscular re-education;Patient/family education;Manual techniques;Passive range of motion;Dry needling;Energy conservation;Taping;Vestibular    PT Next Visit Plan  Consider continuation of C0/C1, C1/C2 joint mobilization; continue and progress scap strengthening and stabilization;     PT Home Exercise Plan  eval: seated cervical retractions; 08/13/17: Added trapezius seated self stretch 3 x 30 seconds 2x/day; 2/8: levator scap stretch    Consulted and Agree with Plan of Care  Patient       Patient will benefit from skilled therapeutic intervention in order to improve the following deficits and impairments:  Decreased activity tolerance, Decreased endurance, Decreased mobility, Decreased range of motion, Decreased strength, Hypomobility, Increased fascial restricitons, Increased muscle spasms, Impaired flexibility, Impaired UE functional use, Improper body mechanics, Postural dysfunction, Pain  Visit Diagnosis: Cervicalgia  Abnormal posture  Other muscle spasm     Problem List Patient Active Problem List   Diagnosis Date Noted  . S/P shoulder replacement 08/29/2012  . Pain in joint, shoulder region 08/29/2012  . Muscle weakness (generalized) 08/29/2012  . Arthritis of shoulder 08/12/2012     Geraldine Solar PT, DPT  [place] [address] [place], [place], [address] Phone: [phone]   Fax:  [phone]  Name: Scott Zamora MRN: [id number] Date of Birth: [birth year]

## 2017-08-20 NOTE — Patient Instructions (Signed)
  Levator Scap Stretch   Sitting upright, tilt your head downward and towards your right armpit. Hold once you feel a gentle stretch on the left side of your neck.  Perform 1x/day, 3-5 stretches holding for 30-60 seconds

## 2017-08-25 ENCOUNTER — Ambulatory Visit (HOSPITAL_COMMUNITY): Payer: Medicare Other

## 2017-08-25 ENCOUNTER — Encounter (HOSPITAL_COMMUNITY): Payer: Self-pay

## 2017-08-25 DIAGNOSIS — M62838 Other muscle spasm: Secondary | ICD-10-CM

## 2017-08-25 DIAGNOSIS — R293 Abnormal posture: Secondary | ICD-10-CM

## 2017-08-25 DIAGNOSIS — M542 Cervicalgia: Secondary | ICD-10-CM

## 2017-08-25 NOTE — Therapy (Signed)
[place], [place], [address] Phone: [phone]   Fax:  [phone]  Physical Therapy Treatment/Reassessment   Patient Details  Name: Scott Zamora MRN: [id number] Date of Birth: 03/18/1935 Referring Provider: Tamala Fothergill, MD   Encounter Date: 08/25/2017  PT End of Session - 08/25/17 1030    Visit Number  6    Number of Visits  13    Date for PT Re-Evaluation  09/15/17    Authorization Type  Medicare Part A and B; Secondary: [facility]; Tertiary: [employer]    Authorization Time Period  08/04/17 to 09/15/17    PT Start Time  1027    PT Stop Time  1108    PT Time Calculation (min)  41 min    Activity Tolerance  Patient tolerated treatment well;No increased pain    Behavior During Therapy  WFL for tasks assessed/performed       Past Medical History:  Diagnosis Date  . Arthritis   . Dry skin   . GERD (gastroesophageal reflux disease)   . Hypertension   . Inguinal hernia, left   . Seasonal allergies   . Sleep apnea    has CPAP but does not wear it  . Vertigo    hx of    Past Surgical History:  Procedure Laterality Date  . COLONOSCOPY W/ POLYPECTOMY    . EYE SURGERY Bilateral   . HERNIA REPAIR    . INGUINAL HERNIA REPAIR Left 04/18/2014   Procedure: HERNIA REPAIR INGUINAL ADULT;  Surgeon: Jamesetta So, MD;  Location: [facility];  Service: General;  Laterality: Left;  . INSERTION OF MESH Left 04/18/2014   Procedure: INSERTION OF MESH;  Surgeon: Jamesetta So, MD;  Location: [facility];  Service: General;  Laterality: Left;  . KNEE ARTHROSCOPY     left knee  . TOTAL SHOULDER ARTHROPLASTY  08/11/2012   Dr Justice Britain  . TOTAL SHOULDER ARTHROPLASTY  08/11/2012   Procedure: TOTAL SHOULDER ARTHROPLASTY;  Surgeon: Marin Shutter, MD;  Location: [place];  Service: Orthopedics;  Laterality: Left;  . TOTAL SHOULDER ARTHROPLASTY Right 09/12/2015   Procedure: RIGHT TOTAL SHOULDER ARTHROPLASTY;   Surgeon: Justice Britain, MD;  Location: [place];  Service: Orthopedics;  Laterality: Right;    There were no vitals filed for this visit.  Subjective Assessment - 08/25/17 1029    Subjective  Pt states that he woke up with 7-8/10 but if he does his exercises, his pain feels better. He states that it is currently about 4/10.    Currently in Pain?  Yes    Pain Score  4     Pain Location  Neck    Pain Orientation  Left    Pain Descriptors / Indicators  Aching    Pain Type  Chronic pain    Pain Onset  More than a month ago    Pain Frequency  Intermittent    Aggravating Factors   looking up at TB for a long time, looking left and right    Pain Relieving Factors  tries to stay busy so he won't get the pain    Effect of Pain on Daily Activities  increases         [facility] PT Assessment - 08/25/17 0001      AROM   Cervical Flexion  46 was 46    Cervical Extension  30 was 20    Cervical - Right Side Bend  20 was 17    Cervical - Left Side Bend  25 was 12    Cervical - Right Rotation  79 was 45    Cervical - Left Rotation  68 was 63      Palpation   Palpation comment  multiple palpable trigger points in pt's L cervical extensors, L UT, L levator scap, and L SCM, which pt reported recreated his pain with palpation, but this is much improved from initial evaluation         [facility] Adult PT Treatment/Exercise - 08/25/17 0001      Neck Exercises: Machines for Strengthening   UBE (Upper Arm Bike)  x3 mins, L3, retro for postural strengthening      Neck Exercises: Theraband   Other Theraband Exercises  D2 flexion with GTB 2x10 reps each    Other Theraband Exercises  face pulls to [facility] Y press with GTBx10 reps      Neck Exercises: Seated   Other Seated Exercise  body blade with elbow bent 3x10" vert and horiz perturbations BUE      Manual Therapy   Manual Therapy  Soft tissue mobilization    Manual therapy comments  Manual therapy completed separate from other skilled interventions to  patient's comfort; totaling 15 minutes    Soft tissue mobilization  in prone: L levator scap, UT, cervical paraspinals         PT Education - 08/25/17 1030    Education provided  Yes    Education Details  reassessment findings; exercise technique    Person(s) Educated  Patient    Methods  Explanation;Demonstration    Comprehension  Verbalized understanding;Returned demonstration       PT Short Term Goals - 08/25/17 1030      PT SHORT TERM GOAL #1   Title  Pt will be independent with HEP and perform consistently in order to decrease pain and maximize overall function.    Time  3    Period  Weeks    Status  Achieved      PT SHORT TERM GOAL #2   Title  Pt will have improved cervical AROM by 10 deg throughout in order to decrease pain and maximize his ability to drive.    Baseline  2/13: see AROM    Time  3    Period  Weeks    Status  Partially Met        PT Long Term Goals - 08/25/17 1031      PT LONG TERM GOAL #1   Title  Pt will have improved AROM in cervical flexion, extension, R/L rotation by 20 deg or > throughout and with no L sided neck pain to maximize his ability to watch Western movies with more comfort and allow him to drive with greater ease.    Baseline  2/13: see AROM    Time  6    Period  Weeks    Status  Partially Met      PT LONG TERM GOAL #2   Title  Pt will report min to no pain with palpation of cervical and periscapular musculature to demo improved soft tissue restrictions and decrease pain.     Baseline  2/13: still has trigger points but improved since initial eval    Time  6    Period  Weeks    Status  On-going      PT LONG TERM GOAL #3   Title  Pt will be able to attain and  maintain proper sitting posture at least 75% of the time to demo improved overall posture, improve his overall cervical alignment, and decrease pt's neck pain.    Baseline  2/13: more aware of it, not currently in proper posture    Time  6    Period  Weeks    Status   Partially Met            Plan - 08/25/17 1109    Clinical Impression Statement  PT reassessed pt's goals and outcome measures this date. Pt has made good progress towards goals as illustrated above. Overall, his AROM has improved, especially with rotation and his soft tissue restrictions are steadily improving. He reports that therapy is helping as he can tell that the frequency and severity of pain are improving, though, he reports continued difficulty with turning his head. POC will continue as planned going forward as pt has responded very well thus far. Ended with scapular strengthening and manual therapy to address soft tissue restrictions; pt reported feeling good at EOS and said he felt like he could go bowling.    Rehab Potential  Good    PT Frequency  2x / week    PT Duration  6 weeks    PT Treatment/Interventions  ADLs/Self Care Home Management;Cryotherapy;Electrical Stimulation;Moist Heat;Traction;Therapeutic activities;Therapeutic exercise;Neuromuscular re-education;Patient/family education;Manual techniques;Passive range of motion;Dry needling;Energy conservation;Taping;Vestibular    PT Next Visit Plan  update strengthening HEP next visit; Consider continuation of C0/C1, C1/C2 joint mobilization; continue manual to L cervical and periscap musculature; continue and progress scap strengthening and stabilization;     PT Home Exercise Plan  eval: seated cervical retractions; 08/13/17: Added trapezius seated self stretch 3 x 30 seconds 2x/day; 2/8: levator scap stretch    Consulted and Agree with Plan of Care  Patient       Patient will benefit from skilled therapeutic intervention in order to improve the following deficits and impairments:  Decreased activity tolerance, Decreased endurance, Decreased mobility, Decreased range of motion, Decreased strength, Hypomobility, Increased fascial restricitons, Increased muscle spasms, Impaired flexibility, Impaired UE functional use, Improper body  mechanics, Postural dysfunction, Pain  Visit Diagnosis: Cervicalgia  Abnormal posture  Other muscle spasm     Problem List Patient Active Problem List   Diagnosis Date Noted  . S/P shoulder replacement 08/29/2012  . Pain in joint, shoulder region 08/29/2012  . Muscle weakness (generalized) 08/29/2012  . Arthritis of shoulder 08/12/2012      Geraldine Solar PT, DPT  [place] [address] [place], [place], [address] Phone: [phone]   Fax:  [phone]  Name: Scott Zamora MRN: [id number] Date of Birth: Jan 10, 1935

## 2017-08-27 ENCOUNTER — Ambulatory Visit (HOSPITAL_COMMUNITY): Payer: Medicare Other | Admitting: Physical Therapy

## 2017-08-27 ENCOUNTER — Encounter (HOSPITAL_COMMUNITY): Payer: Medicare Other

## 2017-08-27 ENCOUNTER — Encounter (HOSPITAL_COMMUNITY): Payer: Self-pay | Admitting: Physical Therapy

## 2017-08-27 DIAGNOSIS — R293 Abnormal posture: Secondary | ICD-10-CM

## 2017-08-27 DIAGNOSIS — M542 Cervicalgia: Secondary | ICD-10-CM | POA: Diagnosis not present

## 2017-08-27 DIAGNOSIS — M62838 Other muscle spasm: Secondary | ICD-10-CM

## 2017-08-27 NOTE — Therapy (Addendum)
[place], [place], [address] Phone: [phone]   Fax:  [phone]  Physical Therapy Treatment  Patient Details  Name: Scott Zamora MRN: [id number] Date of Birth: 1935/03/13 Referring Provider: Tamala Fothergill, MD   Encounter Date: 08/27/2017  PT End of Session - 08/27/17 0940    Visit Number  7    Number of Visits  13    Date for PT Re-Evaluation  09/15/17    Authorization Type  Medicare Part A and B; Secondary: [facility]; Tertiary: [employer]    Authorization Time Period  08/04/17 to 09/15/17    PT Start Time  0902    PT Stop Time  0940    PT Time Calculation (min)  38 min    Activity Tolerance  Patient tolerated treatment well;No increased pain    Behavior During Therapy  WFL for tasks assessed/performed       Past Medical History:  Diagnosis Date  . Arthritis   . Dry skin   . GERD (gastroesophageal reflux disease)   . Hypertension   . Inguinal hernia, left   . Seasonal allergies   . Sleep apnea    has CPAP but does not wear it  . Vertigo    hx of    Past Surgical History:  Procedure Laterality Date  . COLONOSCOPY W/ POLYPECTOMY    . EYE SURGERY Bilateral   . HERNIA REPAIR    . INGUINAL HERNIA REPAIR Left 04/18/2014   Procedure: HERNIA REPAIR INGUINAL ADULT;  Surgeon: Jamesetta So, MD;  Location: [facility];  Service: General;  Laterality: Left;  . INSERTION OF MESH Left 04/18/2014   Procedure: INSERTION OF MESH;  Surgeon: Jamesetta So, MD;  Location: [facility];  Service: General;  Laterality: Left;  . KNEE ARTHROSCOPY     left knee  . TOTAL SHOULDER ARTHROPLASTY  08/11/2012   Dr Justice Britain  . TOTAL SHOULDER ARTHROPLASTY  08/11/2012   Procedure: TOTAL SHOULDER ARTHROPLASTY;  Surgeon: Marin Shutter, MD;  Location: [place];  Service: Orthopedics;  Laterality: Left;  . TOTAL SHOULDER ARTHROPLASTY Right 09/12/2015   Procedure: RIGHT TOTAL SHOULDER ARTHROPLASTY;  Surgeon: Justice Britain, MD;  Location: [place];  Service: Orthopedics;  Laterality: Right;    There were no vitals filed for this visit.  Subjective Assessment - 08/27/17 0950    Subjective  Patient reported that at start of session he was not having any neck pain. Patient reported that he did have some changes in medications, and therapist told him to bring the list next session.     Pain Score  0-No pain                      [facility] Adult PT Treatment/Exercise - 08/27/17 0001      Neck Exercises: Theraband   Scapula Retraction  10 reps;Green    Scapula Retraction Limitations  2 sets + cervical retraction    Shoulder Extension  10 reps;Green Each upper extremity    Shoulder Extension Limitations  2 sets + cervical retraction    Rows  --    Other Theraband Exercises  D2 flexion with GTB 2x10 reps each    Other Theraband Exercises  Bear hugs 1 x 10 repetition with green theraband      Neck Exercises: Standing   Wall Push Ups  10 reps    Other Standing Exercises  Wall slide into Y  position on wall with liftoff x6 reps Painful after 6, discontinued    Other Standing Exercises  Serratus punches 1 x 10 with 5 pound dumbells bilaterally      Neck Exercises: Seated   Neck Retraction  10 reps;Limitations    Neck Retraction Limitations  10x5 sec hold     Other Seated Exercise  body blade with elbow bent 3x10" vert and horiz perturbations BUE    Other Seated Exercise  Serratus anterior activation rolling foam roller with bilateral upper extremities upward 2 x 10  Verbal cues to use shoulder muscles not wrists      Manual Therapy   Manual Therapy  Soft tissue mobilization    Manual therapy comments  10 minutes total Manual therapy completed separate from other skilled interventions to patient's comfort   Soft tissue mobilization  in supine: bilateral levator scap, UT, cervical paraspinals      Neck Exercises: Stretches   Upper Trapezius Stretch  Left;3 reps;30 seconds    Levator Stretch   Left;3 reps;30 seconds             PT Education - 08/27/17 0951    Education provided  Yes    Education Details  Purpose and technique of exercises throughout     [employer]) Educated  Patient    Methods  Explanation;Tactile cues;Verbal cues;Demonstration    Comprehension  Verbalized understanding;Returned demonstration       PT Short Term Goals - 08/25/17 1030      PT SHORT TERM GOAL #1   Title  Pt will be independent with HEP and perform consistently in order to decrease pain and maximize overall function.    Time  3    Period  Weeks    Status  Achieved      PT SHORT TERM GOAL #2   Title  Pt will have improved cervical AROM by 10 deg throughout in order to decrease pain and maximize his ability to drive.    Baseline  2/13: see AROM    Time  3    Period  Weeks    Status  Partially Met        PT Long Term Goals - 08/25/17 1031      PT LONG TERM GOAL #1   Title  Pt will have improved AROM in cervical flexion, extension, R/L rotation by 20 deg or > throughout and with no L sided neck pain to maximize his ability to watch Western movies with more comfort and allow him to drive with greater ease.    Baseline  2/13: see AROM    Time  6    Period  Weeks    Status  Partially Met      PT LONG TERM GOAL #2   Title  Pt will report min to no pain with palpation of cervical and periscapular musculature to demo improved soft tissue restrictions and decrease pain.     Baseline  2/13: still has trigger points but improved since initial eval    Time  6    Period  Weeks    Status  On-going      PT LONG TERM GOAL #3   Title  Pt will be able to attain and maintain proper sitting posture at least 75% of the time to demo improved overall posture, improve his overall cervical alignment, and decrease pt's neck pain.    Baseline  2/13: more aware of it, not currently in proper posture    Time  6  Period  Weeks    Status  Partially Met            Plan - 08/27/17 0951     Clinical Impression Statement  Session began with soft tissue mobilization to patient's bilateral upper trapezius, levator scapulae and cervical paraspinals in order to promote relaxation. Patient reported that he is now taking iron supplements and vitamin C and vitamin A, but is unsure the amount, patient was instructed to bring list of medications next session. Session progressed with upper extremity strengthening. This session added serratus anterior activation exercise in sitting which patient tolerated well once given verbal cues to use shoulder muscles and not wrists. Patient would benefit from continued skilled physical therapy in order to continue addressing deficits in strength, range of motion, and overall functional mobility.     Rehab Potential  Good    PT Frequency  2x / week    PT Duration  6 weeks    PT Treatment/Interventions  ADLs/Self Care Home Management;Cryotherapy;Electrical Stimulation;Moist Heat;Traction;Therapeutic activities;Therapeutic exercise;Neuromuscular re-education;Patient/family education;Manual techniques;Passive range of motion;Dry needling;Energy conservation;Taping;Vestibular    PT Next Visit Plan  update strengthening HEP next visit; Consider continuation of C0/C1, C1/C2 joint mobilization; continue manual to L cervical and periscap musculature; continue and progress scap strengthening and stabilization;     PT Home Exercise Plan  eval: seated cervical retractions; 08/13/17: Added trapezius seated self stretch 3 x 30 seconds 2x/day; 2/8: levator scap stretch    Consulted and Agree with Plan of Care  Patient       Patient will benefit from skilled therapeutic intervention in order to improve the following deficits and impairments:  Decreased activity tolerance, Decreased endurance, Decreased mobility, Decreased range of motion, Decreased strength, Hypomobility, Increased fascial restricitons, Increased muscle spasms, Impaired flexibility, Impaired UE functional use,  Improper body mechanics, Postural dysfunction, Pain  Visit Diagnosis: Cervicalgia  Abnormal posture  Other muscle spasm     Problem List Patient Active Problem List   Diagnosis Date Noted  . S/P shoulder replacement 08/29/2012  . Pain in joint, shoulder region 08/29/2012  . Muscle weakness (generalized) 08/29/2012  . Arthritis of shoulder 08/12/2012    Clarene Critchley PT, DPT 11:13 AM, 08/27/17 [place], [place], [address] Phone: [phone]   Fax:  [phone]  Name: ROLLY MAGRI MRN: [id number] Date of Birth: [birth year]

## 2017-09-01 ENCOUNTER — Ambulatory Visit (HOSPITAL_COMMUNITY): Payer: Medicare Other | Admitting: Physical Therapy

## 2017-09-01 ENCOUNTER — Encounter (HOSPITAL_COMMUNITY): Payer: Self-pay | Admitting: Physical Therapy

## 2017-09-01 ENCOUNTER — Telehealth (HOSPITAL_COMMUNITY): Payer: Self-pay | Admitting: Pulmonary Disease

## 2017-09-01 DIAGNOSIS — R293 Abnormal posture: Secondary | ICD-10-CM | POA: Diagnosis not present

## 2017-09-01 DIAGNOSIS — M62838 Other muscle spasm: Secondary | ICD-10-CM

## 2017-09-01 DIAGNOSIS — M542 Cervicalgia: Secondary | ICD-10-CM | POA: Diagnosis not present

## 2017-09-01 NOTE — Telephone Encounter (Signed)
09/01/17  I called and left a message on cell # to see if patient will move to 8:15 on [employer] schedule.  He is a re-eval and will move [employer] 8:15 to Amy's schedule.  He is on Brooke's schedule for 9:00 and she won't be here on 2/22

## 2017-09-01 NOTE — Therapy (Signed)
[place], [place], [address] Phone: [phone]   Fax:  [phone]  Physical Therapy Treatment  Patient Details  Name: Scott Zamora MRN: [id number] Date of Birth: 06-Dec-1934 Referring Provider: Tamala Fothergill, MD   Encounter Date: 09/01/2017  PT End of Session - 09/01/17 0951    Visit Number  8    Number of Visits  13    Date for PT Re-Evaluation  09/15/17    Authorization Type  Medicare Part A and B; Secondary: [facility]; Tertiary: [employer]    Authorization Time Period  08/04/17 to 09/15/17    PT Start Time  0906    PT Stop Time  0945    PT Time Calculation (min)  39 min    Activity Tolerance  Patient tolerated treatment well;No increased pain    Behavior During Therapy  WFL for tasks assessed/performed       Past Medical History:  Diagnosis Date  . Arthritis   . Dry skin   . GERD (gastroesophageal reflux disease)   . Hypertension   . Inguinal hernia, left   . Seasonal allergies   . Sleep apnea    has CPAP but does not wear it  . Vertigo    hx of    Past Surgical History:  Procedure Laterality Date  . COLONOSCOPY W/ POLYPECTOMY    . EYE SURGERY Bilateral   . HERNIA REPAIR    . INGUINAL HERNIA REPAIR Left 04/18/2014   Procedure: HERNIA REPAIR INGUINAL ADULT;  Surgeon: Jamesetta So, MD;  Location: [facility];  Service: General;  Laterality: Left;  . INSERTION OF MESH Left 04/18/2014   Procedure: INSERTION OF MESH;  Surgeon: Jamesetta So, MD;  Location: [facility];  Service: General;  Laterality: Left;  . KNEE ARTHROSCOPY     left knee  . TOTAL SHOULDER ARTHROPLASTY  08/11/2012   Dr Justice Britain  . TOTAL SHOULDER ARTHROPLASTY  08/11/2012   Procedure: TOTAL SHOULDER ARTHROPLASTY;  Surgeon: Marin Shutter, MD;  Location: [place];  Service: Orthopedics;  Laterality: Left;  . TOTAL SHOULDER ARTHROPLASTY Right 09/12/2015   Procedure: RIGHT TOTAL SHOULDER ARTHROPLASTY;  Surgeon: Justice Britain, MD;  Location: [place];  Service: Orthopedics;  Laterality: Right;    There were no vitals filed for this visit.  Subjective Assessment - 09/01/17 0948    Subjective  Patient reported that he is having some pain on the left side of his neck, but that he feels like therapy has helped improve his pain a lot so far.     Currently in Pain?  Yes    Pain Score  3     Pain Location  Neck    Pain Orientation  Left    Pain Descriptors / Indicators  Aching    Pain Type  Chronic pain                      [facility] Adult PT Treatment/Exercise - 09/01/17 0001      Neck Exercises: Theraband   Scapula Retraction  10 reps;Green    Scapula Retraction Limitations  2 sets x 10 repetitions with cervical retraction    Shoulder Extension  10 reps;Green Bilateral upper extremities    Shoulder Extension Limitations  2 sets + cervical retraction    Other Theraband Exercises  D2 flexion with GTB 2x10 reps each upper extremity    Other Theraband Exercises  Bear hugs  2 x 10 repetition bilateral upper extremities with green theraband      Neck Exercises: Standing   Wall Push Ups  15 reps    Other Standing Exercises  Scapular stabilization rolling soccer ball on the wall 10 x clockwise and then counterclockwise each upper extremity. Wall slide into Y position on wall with liftoff x 15 repetitions    Other Standing Exercises  Serratus punches 1 x 10 with 3 pound dumbells bilaterally      Neck Exercises: Seated   Neck Retraction  10 reps;Limitations    Neck Retraction Limitations  10x5 sec hold     Other Seated Exercise  body blade with elbow bent 3x10" vert and horiz perturbations BUE    Other Seated Exercise  Serratus anterior activation rolling foam roller with bilateral upper extremities upward 2 x 10  10 repetitions with verbal cues to use shoulder muscles      Neck Exercises: Supine   Cervical Isometrics  --      Manual Therapy   Manual Therapy  Soft tissue mobilization    Manual  therapy comments  10 minutes total Manual therapy completed separate from other skilled interventions to patient's comfort    Soft tissue mobilization  in prone: left levator scap, upper trapexius, cervical paraspinals, and periscapular muscles      Neck Exercises: Stretches   Upper Trapezius Stretch  Left;3 reps;30 seconds             PT Education - 09/01/17 0949    Education provided  Yes    Education Details  Patient was educated and purpose and technique of interventions throughout session, and HEP exercises update.     Person(s) Educated  Patient    Methods  Explanation;Demonstration;Tactile cues;Verbal cues    Comprehension  Verbalized understanding;Returned demonstration;Verbal cues required;Tactile cues required       PT Short Term Goals - 08/25/17 1030      PT SHORT TERM GOAL #1   Title  Pt will be independent with HEP and perform consistently in order to decrease pain and maximize overall function.    Time  3    Period  Weeks    Status  Achieved      PT SHORT TERM GOAL #2   Title  Pt will have improved cervical AROM by 10 deg throughout in order to decrease pain and maximize his ability to drive.    Baseline  2/13: see AROM    Time  3    Period  Weeks    Status  Partially Met        PT Long Term Goals - 08/25/17 1031      PT LONG TERM GOAL #1   Title  Pt will have improved AROM in cervical flexion, extension, R/L rotation by 20 deg or > throughout and with no L sided neck pain to maximize his ability to watch Western movies with more comfort and allow him to drive with greater ease.    Baseline  2/13: see AROM    Time  6    Period  Weeks    Status  Partially Met      PT LONG TERM GOAL #2   Title  Pt will report min to no pain with palpation of cervical and periscapular musculature to demo improved soft tissue restrictions and decrease pain.     Baseline  2/13: still has trigger points but improved since initial eval    Time  6    Period  Weeks     Status  On-going      PT LONG TERM GOAL #3   Title  Pt will be able to attain and maintain proper sitting posture at least 75% of the time to demo improved overall posture, improve his overall cervical alignment, and decrease pt's neck pain.    Baseline  2/13: more aware of it, not currently in proper posture    Time  6    Period  Weeks    Status  Partially Met            Plan - 09/01/17 0951    Clinical Impression Statement  This session began with manual therapy to promote relaxation, improve soft tissue mobility, and decrease pain. Therapist noted increased restrictions in patient's left periscapular muscles. The remainder of the session focused on upper extremity and scapular strengthening for improved posture. This session added scapular stabilization exercise with rolling soccer ball on the wall clockwise and counterclockwise which therapist provided tactile cueing to assist with. This session, therapist added scapular retraction and shoulder extension to patient's home exercise program. Patient stated that he has gotten new vitamins, and stated he would bring the list next session. Patient would benefit from continued skilled physical therapy in order to continue addressing patient's pain, range of motion, and postural deficits.     Rehab Potential  Good    PT Frequency  2x / week    PT Duration  6 weeks    PT Treatment/Interventions  ADLs/Self Care Home Management;Cryotherapy;Electrical Stimulation;Moist Heat;Traction;Therapeutic activities;Therapeutic exercise;Neuromuscular re-education;Patient/family education;Manual techniques;Passive range of motion;Dry needling;Energy conservation;Taping;Vestibular    PT Next Visit Plan  update strengthening HEP next visit; Consider continuation of C0/C1, C1/C2 joint mobilization; continue manual to L cervical and periscap musculature; continue and progress scap strengthening and stabilization;     PT Home Exercise Plan  eval: seated cervical  retractions; 08/13/17: Added trapezius seated self stretch 3 x 30 seconds 2x/day; 2/8: levator scap stretch; 09/01/17: Scapular retraction and shoulder extension 2 x 10 with green theraband 3x /week     Consulted and Agree with Plan of Care  Patient       Patient will benefit from skilled therapeutic intervention in order to improve the following deficits and impairments:  Decreased activity tolerance, Decreased endurance, Decreased mobility, Decreased range of motion, Decreased strength, Hypomobility, Increased fascial restricitons, Increased muscle spasms, Impaired flexibility, Impaired UE functional use, Improper body mechanics, Postural dysfunction, Pain  Visit Diagnosis: Cervicalgia  Abnormal posture  Other muscle spasm     Problem List Patient Active Problem List   Diagnosis Date Noted  . S/P shoulder replacement 08/29/2012  . Pain in joint, shoulder region 08/29/2012  . Muscle weakness (generalized) 08/29/2012  . Arthritis of shoulder 08/12/2012   Clarene Critchley PT, DPT 10:23 AM, 09/01/17 [place], [place], [address] Phone: [phone]   Fax:  [phone]  Name: Scott Zamora MRN: [id number] Date of Birth: [birth year]

## 2017-09-03 ENCOUNTER — Ambulatory Visit (HOSPITAL_COMMUNITY): Payer: Medicare Other | Admitting: Physical Therapy

## 2017-09-03 ENCOUNTER — Encounter (HOSPITAL_COMMUNITY): Payer: Self-pay | Admitting: Physical Therapy

## 2017-09-03 DIAGNOSIS — M62838 Other muscle spasm: Secondary | ICD-10-CM

## 2017-09-03 DIAGNOSIS — M542 Cervicalgia: Secondary | ICD-10-CM

## 2017-09-03 DIAGNOSIS — R293 Abnormal posture: Secondary | ICD-10-CM

## 2017-09-03 NOTE — Therapy (Signed)
[place], [place], [address] Phone: [phone]   Fax:  [phone]  Physical Therapy Treatment  Patient Details  Name: Scott Zamora MRN: [id number] Date of Birth: [birth year] Referring Provider: Tamala Fothergill, MD   Encounter Date: 09/03/2017  PT End of Session - 09/03/17 1219    Visit Number  9    Number of Visits  13    Date for PT Re-Evaluation  09/15/17    Authorization Type  Medicare Part A and B; Secondary: [facility]; Tertiary: [employer]    Authorization Time Period  08/04/17 to 09/15/17    PT Start Time  0817    PT Stop Time  0900    PT Time Calculation (min)  43 min    Activity Tolerance  Patient tolerated treatment well;No increased pain    Behavior During Therapy  WFL for tasks assessed/performed       Past Medical History:  Diagnosis Date  . Arthritis   . Dry skin   . GERD (gastroesophageal reflux disease)   . Hypertension   . Inguinal hernia, left   . Seasonal allergies   . Sleep apnea    has CPAP but does not wear it  . Vertigo    hx of    Past Surgical History:  Procedure Laterality Date  . COLONOSCOPY W/ POLYPECTOMY    . EYE SURGERY Bilateral   . HERNIA REPAIR    . INGUINAL HERNIA REPAIR Left 04/18/2014   Procedure: HERNIA REPAIR INGUINAL ADULT;  Surgeon: Jamesetta So, MD;  Location: [facility];  Service: General;  Laterality: Left;  . INSERTION OF MESH Left 04/18/2014   Procedure: INSERTION OF MESH;  Surgeon: Jamesetta So, MD;  Location: [facility];  Service: General;  Laterality: Left;  . KNEE ARTHROSCOPY     left knee  . TOTAL SHOULDER ARTHROPLASTY  08/11/2012   Dr Justice Britain  . TOTAL SHOULDER ARTHROPLASTY  08/11/2012   Procedure: TOTAL SHOULDER ARTHROPLASTY;  Surgeon: Marin Shutter, MD;  Location: [place];  Service: Orthopedics;  Laterality: Left;  . TOTAL SHOULDER ARTHROPLASTY Right 09/12/2015   Procedure: RIGHT TOTAL SHOULDER ARTHROPLASTY;  Surgeon: Justice Britain, MD;  Location: [place];  Service: Orthopedics;  Laterality: Right;    There were no vitals filed for this visit.  Subjective Assessment - 09/03/17 0849    Subjective  Patient reported that he is not having pain today and that he has been doing his exercises at home.     Currently in Pain?  No/denies                      [facility] Adult PT Treatment/Exercise - 09/03/17 0001      Neck Exercises: Theraband   Scapula Retraction  10 reps;Green    Scapula Retraction Limitations  2 sets x 10 repetitions with cervical retraction    Shoulder Extension  10 reps;Green    Shoulder Extension Limitations  2 sets + cervical retraction    Other Theraband Exercises  D2 flexion with GTB 2x10 reps each upper extremity    Other Theraband Exercises  Bear hugs 2 x 10 repetition bilateral upper extremities with green theraband      Neck Exercises: Standing   Wall Push Ups  15 reps    Other Standing Exercises  Scapular stabilization rolling soccer ball on the wall 10 x clockwise and then counterclockwise each upper extremity. Wall slide into  Y position on wall with liftoff x 15 repetitions    Other Standing Exercises  Serratus punches 1 x 10 with 3 pound dumbells bilaterally      Neck Exercises: Seated   Other Seated Exercise  Cervical rotation towel stretch 3 x 15 seconds each side. Body blade with elbow bent 3x10" vert and horiz perturbations BUE    Other Seated Exercise  Serratus anterior activation rolling foam roller with bilateral upper extremities upward 2 x 10  with verbal cues and demonstration for form      Manual Therapy   Manual Therapy  Soft tissue mobilization    Manual therapy comments  10 minutes total Manual therapy completed separate from other skilled interventions to patient's comfort    Soft tissue mobilization  in prone: left levator scap, upper trapexius, cervical paraspinals, and periscapular muscles to patient's tolerance             PT Education - 09/03/17  0852    Education provided  Yes    Education Details  Patient was educated on purpose and technique of interventions throughout session.     Person(s) Educated  Patient    Methods  Explanation;Tactile cues;Verbal cues    Comprehension  Verbalized understanding;Verbal cues required;Tactile cues required       PT Short Term Goals - 08/25/17 1030      PT SHORT TERM GOAL #1   Title  Pt will be independent with HEP and perform consistently in order to decrease pain and maximize overall function.    Time  3    Period  Weeks    Status  Achieved      PT SHORT TERM GOAL #2   Title  Pt will have improved cervical AROM by 10 deg throughout in order to decrease pain and maximize his ability to drive.    Baseline  2/13: see AROM    Time  3    Period  Weeks    Status  Partially Met        PT Long Term Goals - 08/25/17 1031      PT LONG TERM GOAL #1   Title  Pt will have improved AROM in cervical flexion, extension, R/L rotation by 20 deg or > throughout and with no L sided neck pain to maximize his ability to watch Western movies with more comfort and allow him to drive with greater ease.    Baseline  2/13: see AROM    Time  6    Period  Weeks    Status  Partially Met      PT LONG TERM GOAL #2   Title  Pt will report min to no pain with palpation of cervical and periscapular musculature to demo improved soft tissue restrictions and decrease pain.     Baseline  2/13: still has trigger points but improved since initial eval    Time  6    Period  Weeks    Status  On-going      PT LONG TERM GOAL #3   Title  Pt will be able to attain and maintain proper sitting posture at least 75% of the time to demo improved overall posture, improve his overall cervical alignment, and decrease pt's neck pain.    Baseline  2/13: more aware of it, not currently in proper posture    Time  6    Period  Weeks    Status  Partially Met  Plan - 09/03/17 1429    Clinical Impression Statement   This session began with soft tissue mobilization in order to promote relaxation. Then the session progressed to patient performing therapeutic exercises with a focus on scapular stabilization and improved posture. This session added cervical rotation towel self-stretch. Patient tolerated this well. Patient continued to require frequent verbal cues, tactile cues and demonstration with therapeutic exercises.    Rehab Potential  Good    PT Frequency  2x / week    PT Duration  6 weeks    PT Treatment/Interventions  ADLs/Self Care Home Management;Cryotherapy;Electrical Stimulation;Moist Heat;Traction;Therapeutic activities;Therapeutic exercise;Neuromuscular re-education;Patient/family education;Manual techniques;Passive range of motion;Dry needling;Energy conservation;Taping;Vestibular    PT Next Visit Plan  update strengthening HEP next visit; Consider continuation of C0/C1, C1/C2 joint mobilization; continue manual to L cervical and periscap musculature; continue and progress scap strengthening and stabilization    PT Home Exercise Plan  eval: seated cervical retractions; 08/13/17: Added trapezius seated self stretch 3 x 30 seconds 2x/day; 2/8: levator scap stretch; 09/01/17: Scapular retraction and shoulder extension 2 x 10 with green theraband 3x /week     Consulted and Agree with Plan of Care  Patient       Patient will benefit from skilled therapeutic intervention in order to improve the following deficits and impairments:  Decreased activity tolerance, Decreased endurance, Decreased mobility, Decreased range of motion, Decreased strength, Hypomobility, Increased fascial restricitons, Increased muscle spasms, Impaired flexibility, Impaired UE functional use, Improper body mechanics, Postural dysfunction, Pain  Visit Diagnosis: Cervicalgia  Abnormal posture  Other muscle spasm     Problem List Patient Active Problem List   Diagnosis Date Noted  . S/P shoulder replacement 08/29/2012  . Pain  in joint, shoulder region 08/29/2012  . Muscle weakness (generalized) 08/29/2012  . Arthritis of shoulder 08/12/2012   Clarene Critchley PT, DPT 2:34 PM, 09/03/17 [place], [place], [address] Phone: [phone]   Fax:  [phone]  Name: Scott Zamora MRN: [id number] Date of Birth: 1935-02-12

## 2017-09-08 ENCOUNTER — Encounter (HOSPITAL_COMMUNITY): Payer: Self-pay

## 2017-09-08 ENCOUNTER — Ambulatory Visit (HOSPITAL_COMMUNITY): Payer: Medicare Other

## 2017-09-08 DIAGNOSIS — M62838 Other muscle spasm: Secondary | ICD-10-CM

## 2017-09-08 DIAGNOSIS — M542 Cervicalgia: Secondary | ICD-10-CM

## 2017-09-08 DIAGNOSIS — R293 Abnormal posture: Secondary | ICD-10-CM

## 2017-09-08 NOTE — Patient Instructions (Signed)
  PNF D2 Extension  Start with arm down and across your body, elbow bent, thumb at opposite hip. Bring arm up and out, rotating arm so thumb is pointed towards the ceiling. Return to start.   Perform every other day, 2-3 sets of 10-15 reps with GTB    Scapular Retraction with External Rotation   Sit or Standing holding a theraband in your hands, palms facing up. Keep your elbows pulled in at your sides and squeeze your shoulder blades back and together, allowing your arms to rotate out to the side.  Perform every other day, 2-3 sets of 10-15 reps with GTB

## 2017-09-08 NOTE — Therapy (Signed)
[place], [place], [address] Phone: [phone]   Fax:  [phone]  Physical Therapy Treatment  Patient Details  Name: Scott Zamora MRN: [id number] Date of Birth: October 12, 1934 Referring Provider: Tamala Fothergill, MD   Encounter Date: 09/08/2017  PT End of Session - 09/08/17 0903    Visit Number  10    Number of Visits  13    Date for PT Re-Evaluation  09/15/17    Authorization Type  Medicare Part A and B; Secondary: [facility]; Tertiary: [employer]    Authorization Time Period  08/04/17 to 09/15/17    PT Start Time  0900    PT Stop Time  0941    PT Time Calculation (min)  41 min    Activity Tolerance  Patient tolerated treatment well;No increased pain    Behavior During Therapy  WFL for tasks assessed/performed       Past Medical History:  Diagnosis Date  . Arthritis   . Dry skin   . GERD (gastroesophageal reflux disease)   . Hypertension   . Inguinal hernia, left   . Seasonal allergies   . Sleep apnea    has CPAP but does not wear it  . Vertigo    hx of    Past Surgical History:  Procedure Laterality Date  . COLONOSCOPY W/ POLYPECTOMY    . EYE SURGERY Bilateral   . HERNIA REPAIR    . INGUINAL HERNIA REPAIR Left 04/18/2014   Procedure: HERNIA REPAIR INGUINAL ADULT;  Surgeon: Jamesetta So, MD;  Location: [facility];  Service: General;  Laterality: Left;  . INSERTION OF MESH Left 04/18/2014   Procedure: INSERTION OF MESH;  Surgeon: Jamesetta So, MD;  Location: [facility];  Service: General;  Laterality: Left;  . KNEE ARTHROSCOPY     left knee  . TOTAL SHOULDER ARTHROPLASTY  08/11/2012   Dr Justice Britain  . TOTAL SHOULDER ARTHROPLASTY  08/11/2012   Procedure: TOTAL SHOULDER ARTHROPLASTY;  Surgeon: Marin Shutter, MD;  Location: [facility];  Service: Orthopedics;  Laterality: Left;  . TOTAL SHOULDER ARTHROPLASTY Right 09/12/2015   Procedure: RIGHT TOTAL SHOULDER ARTHROPLASTY;  Surgeon: Justice Britain, MD;  Location: [facility];  Service: Orthopedics;  Laterality: Right;    There were no vitals filed for this visit.  Subjective Assessment - 09/08/17 0904    Subjective  Pt states that he had a tough weekend with his neck. Not sure if it was the exercises that he performed last week. Currently he is about 3/10.     Currently in Pain?  Yes    Pain Score  3     Pain Location  Neck    Pain Orientation  Left    Pain Descriptors / Indicators  Aching    Pain Type  Chronic pain    Pain Onset  More than a month ago    Pain Frequency  Intermittent    Aggravating Factors   looking up at TV for a long time, looking left and right    Pain Relieving Factors  tries to stay busy so he won't get the pain    Effect of Pain on Daily Activities  increases            [facility] Adult PT Treatment/Exercise - 09/08/17 0001      Neck Exercises: Machines for Strengthening   UBE (Upper Arm Bike)  x3 mins, L4, retro for postural strengthening  Neck Exercises: Theraband   Scapula Retraction  10 reps;Blue    Scapula Retraction Limitations  2 sets, + neck retraction    Shoulder Extension  10 reps;Blue    Shoulder Extension Limitations  2 sets, + neck retraction    Other Theraband Exercises  D2 flexion with GTB 2x10 reps each upper extremity      Neck Exercises: Standing   Other Standing Exercises  scap stabilization rolling 500g ball on wall 2x30" CW/CCW bouts each      Neck Exercises: Seated   Shoulder Shrugs Limitations  band pulls with BTB 2x10; bil scap retraction +ER 2x10 BTB      Manual Therapy   Manual Therapy  Soft tissue mobilization    Manual therapy comments  completed separate rest of treatment    Soft tissue mobilization  in prone: left levator scap, upper trapexius, cervical paraspinals, and periscapular muscles to patient's tolerance           PT Education - 09/08/17 0922    Education provided  Yes    Education Details  exercise technique, updated HEP    Person(s)  Educated  Patient    Methods  Explanation;Demonstration;Handout    Comprehension  Verbalized understanding;Returned demonstration       PT Short Term Goals - 08/25/17 1030      PT SHORT TERM GOAL #1   Title  Pt will be independent with HEP and perform consistently in order to decrease pain and maximize overall function.    Time  3    Period  Weeks    Status  Achieved      PT SHORT TERM GOAL #2   Title  Pt will have improved cervical AROM by 10 deg throughout in order to decrease pain and maximize his ability to drive.    Baseline  2/13: see AROM    Time  3    Period  Weeks    Status  Partially Met        PT Long Term Goals - 08/25/17 1031      PT LONG TERM GOAL #1   Title  Pt will have improved AROM in cervical flexion, extension, R/L rotation by 20 deg or > throughout and with no L sided neck pain to maximize his ability to watch Western movies with more comfort and allow him to drive with greater ease.    Baseline  2/13: see AROM    Time  6    Period  Weeks    Status  Partially Met      PT LONG TERM GOAL #2   Title  Pt will report min to no pain with palpation of cervical and periscapular musculature to demo improved soft tissue restrictions and decrease pain.     Baseline  2/13: still has trigger points but improved since initial eval    Time  6    Period  Weeks    Status  On-going      PT LONG TERM GOAL #3   Title  Pt will be able to attain and maintain proper sitting posture at least 75% of the time to demo improved overall posture, improve his overall cervical alignment, and decrease pt's neck pain.    Baseline  2/13: more aware of it, not currently in proper posture    Time  6    Period  Weeks    Status  Partially Met            Plan - 09/08/17 1610  Clinical Impression Statement  Continued with established POC working on scapular stabilization, postural strengthening, and improving soft tissue restrictions. Updated HEP to include D2 flexion PNF with  GTB; attempted this with BTB during session today but pt reporting increased shoulder pain so regressed back to [place] and educated pt to use GTB at home. Able to progress other scap stab exercises such as using green weighted ball for ball rolls on wall and progressing pt to BTB during postural exercises. Ended with manual to address soft tissue restrictions in neck. Pt reporting 0/10 pain at EOS.    Rehab Potential  Good    PT Frequency  2x / week    PT Duration  6 weeks    PT Treatment/Interventions  ADLs/Self Care Home Management;Cryotherapy;Electrical Stimulation;Moist Heat;Traction;Therapeutic activities;Therapeutic exercise;Neuromuscular re-education;Patient/family education;Manual techniques;Passive range of motion;Dry needling;Energy conservation;Taping;Vestibular    PT Next Visit Plan  add prone exercises; Consider continuation of C0/C1, C1/C2 joint mobilization; continue manual to L cervical and periscap musculature; continue and progress scap strengthening and stabilization    PT Home Exercise Plan  eval: seated cervical retractions; 08/13/17: Added trapezius seated self stretch 3 x 30 seconds 2x/day; 2/8: levator scap stretch; 09/01/17: Scapular retraction and shoulder extension 2 x 10 with green theraband 3x /week ; 2/27: scap ret+ER, D2PNF flexion    Consulted and Agree with Plan of Care  Patient       Patient will benefit from skilled therapeutic intervention in order to improve the following deficits and impairments:  Decreased activity tolerance, Decreased endurance, Decreased mobility, Decreased range of motion, Decreased strength, Hypomobility, Increased fascial restricitons, Increased muscle spasms, Impaired flexibility, Impaired UE functional use, Improper body mechanics, Postural dysfunction, Pain  Visit Diagnosis: Cervicalgia  Abnormal posture  Other muscle spasm     Problem List Patient Active Problem List   Diagnosis Date Noted  . S/P shoulder replacement 08/29/2012  .  Pain in joint, shoulder region 08/29/2012  . Muscle weakness (generalized) 08/29/2012  . Arthritis of shoulder 08/12/2012       Geraldine Solar PT, DPT  [facility] [address] [place], [place], [address] Phone: [phone]   Fax:  [phone]  Name: Scott Zamora MRN: [id number] Date of Birth: 01/13/1935

## 2017-09-10 ENCOUNTER — Ambulatory Visit (HOSPITAL_COMMUNITY): Payer: Medicare Other | Attending: Neurological Surgery

## 2017-09-10 ENCOUNTER — Encounter (HOSPITAL_COMMUNITY): Payer: Self-pay

## 2017-09-10 DIAGNOSIS — M62838 Other muscle spasm: Secondary | ICD-10-CM | POA: Diagnosis not present

## 2017-09-10 DIAGNOSIS — R293 Abnormal posture: Secondary | ICD-10-CM | POA: Insufficient documentation

## 2017-09-10 DIAGNOSIS — M542 Cervicalgia: Secondary | ICD-10-CM

## 2017-09-10 NOTE — Therapy (Signed)
[place], [place], [address] Phone: [phone]   Fax:  [phone]  Physical Therapy Treatment  Patient Details  Name: Scott Zamora MRN: [id number] Date of Birth: 03/31/1935 Referring Provider: Tamala Fothergill, MD   Encounter Date: 09/10/2017  PT End of Session - 09/10/17 0901    Visit Number  11    Number of Visits  13    Date for PT Re-Evaluation  09/15/17    Authorization Type  Medicare Part A and B; Secondary: [facility]; Tertiary: [employer]    Authorization Time Period  08/04/17 to 09/15/17    PT Start Time  0900    PT Stop Time  0941    PT Time Calculation (min)  41 min    Activity Tolerance  Patient tolerated treatment well;No increased pain    Behavior During Therapy  WFL for tasks assessed/performed       Past Medical History:  Diagnosis Date  . Arthritis   . Dry skin   . GERD (gastroesophageal reflux disease)   . Hypertension   . Inguinal hernia, left   . Seasonal allergies   . Sleep apnea    has CPAP but does not wear it  . Vertigo    hx of    Past Surgical History:  Procedure Laterality Date  . COLONOSCOPY W/ POLYPECTOMY    . EYE SURGERY Bilateral   . HERNIA REPAIR    . INGUINAL HERNIA REPAIR Left 04/18/2014   Procedure: HERNIA REPAIR INGUINAL ADULT;  Surgeon: Jamesetta So, MD;  Location: [facility];  Service: General;  Laterality: Left;  . INSERTION OF MESH Left 04/18/2014   Procedure: INSERTION OF MESH;  Surgeon: Jamesetta So, MD;  Location: [facility];  Service: General;  Laterality: Left;  . KNEE ARTHROSCOPY     left knee  . TOTAL SHOULDER ARTHROPLASTY  08/11/2012   Dr Justice Britain  . TOTAL SHOULDER ARTHROPLASTY  08/11/2012   Procedure: TOTAL SHOULDER ARTHROPLASTY;  Surgeon: Marin Shutter, MD;  Location: [facility];  Service: Orthopedics;  Laterality: Left;  . TOTAL SHOULDER ARTHROPLASTY Right 09/12/2015   Procedure: RIGHT TOTAL SHOULDER ARTHROPLASTY;  Surgeon: Justice Britain, MD;  Location: [facility];  Service: Orthopedics;  Laterality: Right;    There were no vitals filed for this visit.  Subjective Assessment - 09/10/17 0901    Subjective  Pt states that his neck is feeling good this morning. He states that following his last treatment session, he did have some neck pain. When asked to distinguish it between muscle soreness and his same pain, he stated that he was unsure what it was     Currently in Pain?  No/denies    Pain Onset  More than a month ago         [facility] Adult PT Treatment/Exercise - 09/10/17 0001      Neck Exercises: Machines for Strengthening   UBE (Upper Arm Bike)  x3 mins, L4, retro for postural strengthening    Other Machines for Strengthening  Cables: single arm rows 2plates x10 reps each, GH extension 2 plates x10 reps each; horizontal abd 1 plate x10 reps each      Neck Exercises: Standing   Wall Push Ups  10 reps;Limitations    Wall Push Ups Limitations  2 sets with plus    Other Standing Exercises  scap stabilization rolling 500g ball on wall 2x30" CW/CCW bouts each  Neck Exercises: Supine   Other Supine Exercise  serratus punches in supine 2x10 8# DB BUE      Neck Exercises: Prone   Other Prone Exercise  I's (GH ext), Y's, T's x10 reps BUE      Manual Therapy   Manual Therapy  Soft tissue mobilization;Manual Traction;Passive ROM    Manual therapy comments  completed separate rest of treatment    Soft tissue mobilization  in supine: left levator scap, upper trapexius, cervical paraspinals, and periscapular muscles to patient's tolerance    Passive ROM  capital flexion stretch, OA rotation stretch bil, lower cervical rotation stretch bilaterall x30 seconds each    Manual Traction  suboccipital release + gentle traction 4x30 sec bouts          PT Education - 09/10/17 0904    Education provided  Yes    Education Details  exercise technique; reassess next visit; proper HEP frequency/duration    Person(s) Educated   Patient    Methods  Explanation    Comprehension  Verbalized understanding;Returned demonstration       PT Short Term Goals - 08/25/17 1030      PT SHORT TERM GOAL #1   Title  Pt will be independent with HEP and perform consistently in order to decrease pain and maximize overall function.    Time  3    Period  Weeks    Status  Achieved      PT SHORT TERM GOAL #2   Title  Pt will have improved cervical AROM by 10 deg throughout in order to decrease pain and maximize his ability to drive.    Baseline  2/13: see AROM    Time  3    Period  Weeks    Status  Partially Met        PT Long Term Goals - 08/25/17 1031      PT LONG TERM GOAL #1   Title  Pt will have improved AROM in cervical flexion, extension, R/L rotation by 20 deg or > throughout and with no L sided neck pain to maximize his ability to watch Western movies with more comfort and allow him to drive with greater ease.    Baseline  2/13: see AROM    Time  6    Period  Weeks    Status  Partially Met      PT LONG TERM GOAL #2   Title  Pt will report min to no pain with palpation of cervical and periscapular musculature to demo improved soft tissue restrictions and decrease pain.     Baseline  2/13: still has trigger points but improved since initial eval    Time  6    Period  Weeks    Status  On-going      PT LONG TERM GOAL #3   Title  Pt will be able to attain and maintain proper sitting posture at least 75% of the time to demo improved overall posture, improve his overall cervical alignment, and decrease pt's neck pain.    Baseline  2/13: more aware of it, not currently in proper posture    Time  6    Period  Weeks    Status  Partially Met            Plan - 09/10/17 0942    Clinical Impression Statement  Progressed pt's scapular stabilization this date by adding cables and prone exercises; pt tolerated well but demo'd muscle fatigue and required cues  to decrease UT compensation. He continues with scapular  winging during therex indicating continued serratus anterior weakness. Pt challenged with prone exercises; had to have pt perform [place] ext versus flexion for I's due to increased compensations and difficulty. Ended with manual to address soft tissue restrictions and performed manual stretching for pain control and improved ROM. No pain at EOS. Pt due for reassessment next visit.    Rehab Potential  Good    PT Frequency  2x / week    PT Duration  6 weeks    PT Treatment/Interventions  ADLs/Self Care Home Management;Cryotherapy;Electrical Stimulation;Moist Heat;Traction;Therapeutic activities;Therapeutic exercise;Neuromuscular re-education;Patient/family education;Manual techniques;Passive range of motion;Dry needling;Energy conservation;Taping;Vestibular    PT Next Visit Plan  reassessment    PT Home Exercise Plan  eval: seated cervical retractions; 08/13/17: Added trapezius seated self stretch 3 x 30 seconds 2x/day; 2/8: levator scap stretch; 09/01/17: Scapular retraction and shoulder extension 2 x 10 with green theraband 3x /week ; 2/27: scap ret+ER, D2PNF flexion    Consulted and Agree with Plan of Care  Patient       Patient will benefit from skilled therapeutic intervention in order to improve the following deficits and impairments:  Decreased activity tolerance, Decreased endurance, Decreased mobility, Decreased range of motion, Decreased strength, Hypomobility, Increased fascial restricitons, Increased muscle spasms, Impaired flexibility, Impaired UE functional use, Improper body mechanics, Postural dysfunction, Pain  Visit Diagnosis: Cervicalgia  Abnormal posture  Other muscle spasm     Problem List Patient Active Problem List   Diagnosis Date Noted  . S/P shoulder replacement 08/29/2012  . Pain in joint, shoulder region 08/29/2012  . Muscle weakness (generalized) 08/29/2012  . Arthritis of shoulder 08/12/2012        Geraldine Solar PT, DPT  [facility] [address] [place], [place], [address] Phone: [phone]   Fax:  [phone]  Name: Scott Zamora MRN: [id number] Date of Birth: [birth year]

## 2017-09-13 ENCOUNTER — Telehealth (HOSPITAL_COMMUNITY): Payer: Self-pay | Admitting: Pulmonary Disease

## 2017-09-13 NOTE — Telephone Encounter (Signed)
09/13/17  Called and left a message to see if patient can come in on 3/6 instead of 3/7 with Pamala Hurry at the same time

## 2017-09-15 ENCOUNTER — Encounter (HOSPITAL_COMMUNITY): Payer: Self-pay

## 2017-09-15 ENCOUNTER — Ambulatory Visit (HOSPITAL_COMMUNITY): Payer: Medicare Other

## 2017-09-15 DIAGNOSIS — M62838 Other muscle spasm: Secondary | ICD-10-CM

## 2017-09-15 DIAGNOSIS — R293 Abnormal posture: Secondary | ICD-10-CM

## 2017-09-15 DIAGNOSIS — M542 Cervicalgia: Secondary | ICD-10-CM

## 2017-09-15 NOTE — Patient Instructions (Signed)
Wall Push-Up Plus: Double Arm    Stand 4___ feet from wall, both hands on wall. Perform a push-up. Give an extra push at end to bring shoulder blades forward on rib cage. Repeat _10__ times per set. Do _1__ sets per session.  http://plyo.exer.us/184   Copyright  VHI. All rights reserved.

## 2017-09-15 NOTE — Therapy (Signed)
[place], [place], [address] Phone: [phone]   Fax:  [phone]  Physical Therapy Treatment/Discharge Summary  Patient Details  Name: Scott Zamora MRN: [id number] Date of Birth: Oct 29, 1934 Referring Provider: Kevan Ny Ditty   Encounter Date: 09/15/2017  PT End of Session - 09/15/17 0948    Visit Number  12    Number of Visits  13    Date for PT Re-Evaluation  09/15/17    Authorization Type  Medicare Part A and B; Secondary: [facility]; Tertiary: [employer]    Authorization Time Period  08/04/17 to 09/15/17    PT Start Time  0946    PT Stop Time  1033    PT Time Calculation (min)  47 min    Activity Tolerance  Patient tolerated treatment well;No increased pain    Behavior During Therapy  WFL for tasks assessed/performed       Past Medical History:  Diagnosis Date  . Arthritis   . Dry skin   . GERD (gastroesophageal reflux disease)   . Hypertension   . Inguinal hernia, left   . Seasonal allergies   . Sleep apnea    has CPAP but does not wear it  . Vertigo    hx of    Past Surgical History:  Procedure Laterality Date  . COLONOSCOPY W/ POLYPECTOMY    . EYE SURGERY Bilateral   . HERNIA REPAIR    . INGUINAL HERNIA REPAIR Left 04/18/2014   Procedure: HERNIA REPAIR INGUINAL ADULT;  Surgeon: Jamesetta So, MD;  Location: [facility];  Service: General;  Laterality: Left;  . INSERTION OF MESH Left 04/18/2014   Procedure: INSERTION OF MESH;  Surgeon: Jamesetta So, MD;  Location: [facility];  Service: General;  Laterality: Left;  . KNEE ARTHROSCOPY     left knee  . TOTAL SHOULDER ARTHROPLASTY  08/11/2012   Dr Justice Britain  . TOTAL SHOULDER ARTHROPLASTY  08/11/2012   Procedure: TOTAL SHOULDER ARTHROPLASTY;  Surgeon: Marin Shutter, MD;  Location: [place];  Service: Orthopedics;  Laterality: Left;  . TOTAL SHOULDER ARTHROPLASTY Right 09/12/2015   Procedure: RIGHT TOTAL SHOULDER ARTHROPLASTY;   Surgeon: Justice Britain, MD;  Location: [place];  Service: Orthopedics;  Laterality: Right;    There were no vitals filed for this visit.  Subjective Assessment - 09/15/17 1222    Subjective  Pt states that his neck is feeling good this morning. The last few days he had more pain possibly due to the weather. He has good days and bad days but overall does notice improvement.     Currently in Pain?  Yes    Pain Score  3     Pain Location  Neck    Pain Orientation  Left    Pain Descriptors / Indicators  Aching stiff    Pain Type  Chronic pain    Pain Onset  More than a month ago    Pain Frequency  Intermittent         [facility] PT Assessment - 09/15/17 0001      Assessment   Medical Diagnosis  cervical spondylosis with radiculopathy    Referring Provider  Kevan Ny Ditty    Onset Date/Surgical Date  08/04/16    Next MD Visit  doesn't have one currently    Prior Therapy  OT for bil shoulder replacements in 2017; PT for neck pain in early 2018  Observation/Other Assessments   Focus on Therapeutic Outcomes (FOTO)   33% limitation      Posture/Postural Control   Posture/Postural Control  Postural limitations    Postural Limitations  Rounded Shoulders;Forward head      AROM   Cervical Flexion  46 was 46    Cervical Extension  42 was 30    Cervical - Right Side Bend  32 was 20    Cervical - Left Side Bend  22 was 25    Cervical - Right Rotation  68 was 79    Cervical - Left Rotation  70 was 68      Strength   Right Shoulder Flexion  5/5    Right Shoulder ABduction  5/5    Right Shoulder Internal Rotation  5/5    Right Shoulder External Rotation  5/5    Left Shoulder Flexion  4+/5    Left Shoulder ABduction  4+/5    Left Shoulder Internal Rotation  5/5    Left Shoulder External Rotation  4+/5    Right Hand Gross Grasp  Functional    Left Hand Gross Grasp  Functional      Palpation   Palpation comment  minimal trigger points in upper trap and periscapular/paraspinal  muscles                  [facility] Adult PT Treatment/Exercise - 09/15/17 0001      Neck Exercises: Standing   Wall Push Ups  10 reps;Limitations    Wall Push Ups Limitations  2 sets with plus      Neck Exercises: Prone   Other Prone Exercise  I's (GH ext), Y's, T's x10 reps BUE             PT Education - 09/15/17 1234    Education provided  Yes    Education Details  exercise technique, advance HEP    Person(s) Educated  Patient    Methods  Explanation;Handout;Demonstration    Comprehension  Verbalized understanding       PT Short Term Goals - 09/15/17 1240      PT SHORT TERM GOAL #1   Title  Pt will be independent with HEP and perform consistently in order to decrease pain and maximize overall function.    Time  3    Period  Weeks    Status  Achieved      PT SHORT TERM GOAL #2   Title  Pt will have improved cervical AROM by 10 deg throughout in order to decrease pain and maximize his ability to drive.    Baseline  2/13: see AROM    Time  3    Period  Weeks    Status  Achieved      PT SHORT TERM GOAL #3   Title  Pt Rt cervical rotation  to be improved by 10 degrees in right rotation to improve safety in driving.     Status  Achieved        PT Long Term Goals - 09/15/17 1240      PT LONG TERM GOAL #1   Title  Pt will have improved AROM in cervical flexion, extension, R/L rotation by 20 deg or > throughout and with no L sided neck pain to maximize his ability to watch Western movies with more comfort and allow him to drive with greater ease.    Baseline  2/13: see AROM    Time  6    Period  Weeks  Status  Partially Met      PT LONG TERM GOAL #2   Title  Pt will report min to no pain with palpation of cervical and periscapular musculature to demo improved soft tissue restrictions and decrease pain.     Baseline  2/13: still has trigger points but improved since initial eval    Time  6    Period  Weeks    Status  Achieved      PT LONG TERM GOAL  #3   Title  Pt will be able to attain and maintain proper sitting posture at least 75% of the time to demo improved overall posture, improve his overall cervical alignment, and decrease pt's neck pain.    Baseline  2/13: more aware of it, not currently in proper posture    Time  6    Period  Weeks    Status  Partially Met            Plan - 09/15/17 1236    Clinical Impression Statement  Patient's AROM, strength, pain and function have all improved. Patient continues to perform his HEP frequently and is quite active in athletics and the community.  Patient feels he is significantly better and will continue his HEP to keep his pain level under control.  Patient agrees to DC [facility] at this time.     History and Personal Factors relevant to plan of care:  active, motivated to participate in HEP and PT; chronicity of issue, HTN, OA, GERD, h/o bilateral shoulder replacements.    Clinical Presentation  Stable    Rehab Potential  Good    PT Frequency  2x / week    PT Duration  6 weeks    PT Treatment/Interventions  ADLs/Self Care Home Management;Cryotherapy;Electrical Stimulation;Moist Heat;Traction;Therapeutic activities;Therapeutic exercise;Neuromuscular re-education;Patient/family education;Manual techniques;Passive range of motion;Dry needling;Energy conservation;Taping;Vestibular    PT Next Visit Plan  DC from [facility] at this time.    PT Home Exercise Plan  eval: seated cervical retractions; 08/13/17: Added trapezius seated self stretch 3 x 30 seconds 2x/day; 2/8: levator scap stretch; 09/01/17: Scapular retraction and shoulder extension 2 x 10 with green theraband 3x /week ; 2/27: scap ret+ER, D2PNF flexion; 09/15/2017 - wall/counter push up with plus for serratus anterior strengthening    Consulted and Agree with Plan of Care  Patient       Patient will benefit from skilled therapeutic intervention in order to improve the following deficits and impairments:  Decreased activity tolerance, Decreased  endurance, Decreased mobility, Decreased range of motion, Decreased strength, Hypomobility, Increased fascial restricitons, Increased muscle spasms, Impaired flexibility, Impaired UE functional use, Improper body mechanics, Postural dysfunction, Pain  Visit Diagnosis: Cervicalgia  Abnormal posture  Other muscle spasm     Problem List Patient Active Problem List   Diagnosis Date Noted  . S/P shoulder replacement 08/29/2012  . Pain in joint, shoulder region 08/29/2012  . Muscle weakness (generalized) 08/29/2012  . Arthritis of shoulder 08/12/2012   PHYSICAL THERAPY DISCHARGE SUMMARY  Visits from Start of Care: 12  Current functional level related to goals / functional outcomes: See above   Remaining deficits: See above   Education / Equipment: See above  Plan: Patient agrees to discharge.  Patient goals were partially met. Patient is being discharged due to being pleased with the current functional level.  ?????     Floria Raveling. Hartnett-Rands, MS, PT [facility] #[id number] 09/15/2017, 12:43 PM  [place]  [place], [place], [address] Phone: [phone]   Fax:  [phone]  Name: Scott Zamora MRN: [id number] Date of Birth: 1934/12/18

## 2017-09-16 ENCOUNTER — Ambulatory Visit (HOSPITAL_COMMUNITY): Payer: Medicare Other

## 2018-04-19 DIAGNOSIS — Z23 Encounter for immunization: Secondary | ICD-10-CM | POA: Diagnosis not present

## 2018-05-31 ENCOUNTER — Other Ambulatory Visit: Payer: Self-pay | Admitting: Internal Medicine

## 2018-05-31 DIAGNOSIS — I712 Thoracic aortic aneurysm, without rupture, unspecified: Secondary | ICD-10-CM

## 2018-06-02 ENCOUNTER — Ambulatory Visit
Admission: RE | Admit: 2018-06-02 | Discharge: 2018-06-02 | Disposition: A | Discharge status: Home / Self Care | Payer: TRICARE For Life (TFL) | Source: Ambulatory Visit | Attending: Internal Medicine | Admitting: Internal Medicine

## 2018-06-02 DIAGNOSIS — I712 Thoracic aortic aneurysm, without rupture, unspecified: Secondary | ICD-10-CM

## 2018-06-15 ENCOUNTER — Other Ambulatory Visit: Payer: Self-pay | Admitting: Internal Medicine

## 2018-06-16 ENCOUNTER — Other Ambulatory Visit: Payer: Self-pay | Admitting: Internal Medicine

## 2018-06-16 DIAGNOSIS — I714 Abdominal aortic aneurysm, without rupture, unspecified: Secondary | ICD-10-CM

## 2018-06-22 ENCOUNTER — Ambulatory Visit
Admission: RE | Admit: 2018-06-22 | Discharge: 2018-06-22 | Disposition: A | Discharge status: Home / Self Care | Payer: Medicare Other | Source: Ambulatory Visit | Attending: Internal Medicine | Admitting: Internal Medicine

## 2018-06-22 DIAGNOSIS — I77811 Abdominal aortic ectasia: Secondary | ICD-10-CM | POA: Diagnosis not present

## 2018-06-22 DIAGNOSIS — I714 Abdominal aortic aneurysm, without rupture, unspecified: Secondary | ICD-10-CM

## 2019-02-09 ENCOUNTER — Other Ambulatory Visit: Payer: Self-pay

## 2019-02-20 ENCOUNTER — Other Ambulatory Visit: Payer: Self-pay

## 2019-02-20 DIAGNOSIS — R6889 Other general symptoms and signs: Secondary | ICD-10-CM | POA: Diagnosis not present

## 2019-02-20 DIAGNOSIS — Z20822 Contact with and (suspected) exposure to covid-19: Secondary | ICD-10-CM

## 2019-02-21 LAB — NOVEL CORONAVIRUS, NAA: SARS-CoV-2, NAA: NOT DETECTED

## 2019-02-23 DIAGNOSIS — Z961 Presence of intraocular lens: Secondary | ICD-10-CM | POA: Diagnosis not present

## 2019-02-23 DIAGNOSIS — H26492 Other secondary cataract, left eye: Secondary | ICD-10-CM | POA: Diagnosis not present

## 2019-03-08 DIAGNOSIS — H26492 Other secondary cataract, left eye: Secondary | ICD-10-CM | POA: Diagnosis not present

## 2019-04-17 DIAGNOSIS — Z23 Encounter for immunization: Secondary | ICD-10-CM | POA: Diagnosis not present

## 2019-05-11 ENCOUNTER — Encounter: Payer: Self-pay | Admitting: Family Medicine

## 2019-05-11 ENCOUNTER — Ambulatory Visit (INDEPENDENT_AMBULATORY_CARE_PROVIDER_SITE_OTHER): Payer: Medicare Other | Admitting: Family Medicine

## 2019-05-11 ENCOUNTER — Other Ambulatory Visit: Payer: Self-pay

## 2019-05-11 VITALS — Temp 98.2°F | Ht 72.0 in | Wt 171.2 lb

## 2019-05-11 DIAGNOSIS — I10 Essential (primary) hypertension: Secondary | ICD-10-CM

## 2019-05-11 DIAGNOSIS — K219 Gastro-esophageal reflux disease without esophagitis: Secondary | ICD-10-CM | POA: Diagnosis not present

## 2019-05-11 DIAGNOSIS — M19019 Primary osteoarthritis, unspecified shoulder: Secondary | ICD-10-CM | POA: Diagnosis not present

## 2019-05-11 NOTE — Progress Notes (Signed)
New Patient Office Visit  Subjective:  Patient ID: Scott Zamora, male    DOB: [birth year]  Age: 84 y.o. MRN: [id number]  CC:  Chief Complaint  Patient presents with  . Establish Care  HTN/GERD/AR  HPI Scott Zamora presents for HTN-HCTZ/Verapamil , GERD-Prilosec Pt sees the [place] for treatment and medication. Pt states he has been going to PT for neck pain-uses naprosyn and tylenol. 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 Pt with bilat shoulder replacements. Pt sees eye doctor yearly-cataract surgery  Past Medical History:  Diagnosis Date  . Arthritis   . Dry skin   . GERD (gastroesophageal reflux disease)   . Hypertension   . Inguinal hernia, left   . Seasonal allergies   . Sleep apnea    has CPAP but does not wear it  . Vertigo    hx of    Past Surgical History:  Procedure Laterality Date  . COLONOSCOPY W/ POLYPECTOMY    . EYE SURGERY Bilateral   . HERNIA REPAIR    . INGUINAL HERNIA REPAIR Left 04/18/2014   Procedure: HERNIA REPAIR INGUINAL ADULT;  Surgeon: Jamesetta So, MD;  Location: [facility];  Service: General;  Laterality: Left;  . INSERTION OF MESH Left 04/18/2014   Procedure: INSERTION OF MESH;  Surgeon: Jamesetta So, MD;  Location: [facility];  Service: General;  Laterality: Left;  . KNEE ARTHROSCOPY     left knee  . TOTAL SHOULDER ARTHROPLASTY  08/11/2012   Dr Justice Britain  . TOTAL SHOULDER ARTHROPLASTY  08/11/2012   Procedure: TOTAL SHOULDER ARTHROPLASTY;  Surgeon: Marin Shutter, MD;  Location: [place];  Service: Orthopedics;  Laterality: Left;  . TOTAL SHOULDER ARTHROPLASTY Right 09/12/2015   Procedure: RIGHT TOTAL SHOULDER ARTHROPLASTY;  Surgeon: Justice Britain, MD;  Location: [place];  Service: Orthopedics;  Laterality: Right;    No family history on file.  Social History   Socioeconomic History  . Marital status: Married    Spouse name: Not on file  . Number of children: Not on file  . Years of education: Not on file  . Highest education level: Not on file   Occupational History  . Not on file  Social Needs  . Financial resource strain: Not on file  . Food insecurity    Worry: Not on file    Inability: Not on file  . Transportation needs    Medical: Not on file    Non-medical: Not on file  Tobacco Use  . Smoking status: Never Smoker  . Smokeless tobacco: Never Used  Substance and Sexual Activity  . Alcohol use: No  . Drug use: No  . Sexual activity: Not on file  Lifestyle  . Physical activity    Days per week: Not on file    Minutes per session: Not on file  . Stress: Not on file  Relationships  . Social Herbalist on phone: Not on file    Gets together: Not on file    Attends religious service: Not on file    Active member of club or organization: Not on file    Attends meetings of clubs or organizations: Not on file    Relationship status: Not on file  . Intimate partner violence    Fear of current or ex partner: Not on file    Emotionally abused: Not on file    Physically abused: Not on file    Forced sexual activity: Not on file  Other Topics Concern  . Not on file  Social History Narrative  . Not on file    ROS Review of Systems  Constitutional: Negative for fatigue.  Gastrointestinal:       Jerrye Bushy  Endocrine: Negative.   Musculoskeletal: Positive for arthralgias.  Skin:       Dry skin  Allergic/Immunologic: Positive for environmental allergies.    Objective:   Today's Vitals: Temp 98.2 F (36.8 C) (Oral)   Ht 6' (1.829 m)   Wt 171 lb 3.2 oz (77.7 kg)   SpO2 94%   BMI 23.22 kg/m   Physical Exam Constitutional:      Appearance: Normal appearance. He is normal weight.  HENT:     Head: Normocephalic and atraumatic.     Right Ear: Tympanic membrane, ear canal and external ear normal.     Left Ear: Tympanic membrane, ear canal and external ear normal.     Nose: Nose normal.  Cardiovascular:     Rate and Rhythm: Normal rate and regular rhythm.     Pulses: Normal pulses.     Heart sounds:  Normal heart sounds.  Pulmonary:     Effort: Pulmonary effort is normal.     Breath sounds: Normal breath sounds.  Musculoskeletal:        General: No deformity.  Skin:    General: Skin is dry.  Neurological:     Mental Status: He is alert and oriented to person, place, and time.  Psychiatric:        Behavior: Behavior normal.     Assessment & Plan:    Outpatient Encounter Medications as of 05/11/2019  Medication Sig  . ammonium lactate (AMLACTIN) 12 % cream Apply topically as needed for dry skin.  . fluticasone (VERAMYST) 27.5 MCG/SPRAY nasal spray Place 2 sprays into the nose daily.  Marland Kitchen acetaminophen (TYLENOL) 500 MG tablet Take 500 mg by mouth at bedtime. Reported on 09/26/2015  . ascorbic acid (VITAMIN C) 250 MG tablet Take 250 mg by mouth daily.  Marland Kitchen aspirin EC 81 MG tablet Take 81 mg by mouth daily.  . Cholecalciferol (VITAMIN D-3) 1000 UNITS CAPS Take 1,000 Units by mouth daily.  . COD LIVER OIL PO Take 1 tablet by mouth daily as needed (for supplementation). Reported on 09/26/2015  . Coenzyme Q10 (COQ-10 PO) Take 1 tablet by mouth daily. Reported on 09/26/2015  . diazepam (VALIUM) 2 MG tablet Take 1 tablet (2 mg total) by mouth every 6 (six) hours as needed for muscle spasms or sedation. (Patient not taking: Reported on 09/26/2015)  . Digestive Enzymes (PAPAYA AND ENZYMES PO) Take 2 tablets by mouth daily as needed (for digestive support). Reported on 09/26/2015  . doxycycline (VIBRAMYCIN) 100 MG capsule Take 1 capsule (100 mg total) by mouth 2 (two) times daily. (Patient not taking: Reported on 09/03/2017)  . ferrous sulfate 325 (65 FE) MG tablet Take 325 mg by mouth daily.  . flunisolide (NASALIDE) 25 MCG/ACT (0.025%) SOLN Inhale 2 sprays into the lungs daily as needed. Reported on 09/26/2015  . hydrochlorothiazide (HYDRODIURIL) 25 MG tablet Take 12.5 mg by mouth daily.  Marland Kitchen loratadine (CLARITIN) 10 MG tablet Take 10 mg by mouth daily as needed. For allergies  . naproxen (NAPROSYN)  500 MG tablet Take 500 mg by mouth 2 (two) times daily as needed. For pain  . omeprazole (PRILOSEC) 20 MG capsule Take 20 mg by mouth daily.  Marland Kitchen oxyCODONE-acetaminophen (PERCOCET) 5-325 MG tablet Take 1-2 tablets by mouth every 4 (four)  hours as needed. (Patient not taking: Reported on 09/03/2017)  . POTASSIUM PO Take 1 tablet by mouth daily. Reported on 09/26/2015  . sildenafil (VIAGRA) 100 MG tablet Take 50 mg by mouth daily as needed. Reported on 09/26/2015  . tamsulosin (FLOMAX) 0.4 MG CAPS capsule Take 0.4 mg by mouth daily after supper.  . TURMERIC PO Take 300 mg by mouth daily. Reported on 09/26/2015  . verapamil (CALAN-SR) 240 MG CR tablet Take 240 mg by mouth at bedtime.  . vitamin B-12 (CYANOCOBALAMIN) 250 MCG tablet Take 250 mcg by mouth 2 (two) times a week. Reported on 09/26/2015   No facility-administered encounter medications on file as of 05/11/2019.   1. Arthritis of shoulder bilat   2. Essential hypertension HCTZ/verapamil-[facility]  3. Gastroesophageal reflux disease without esophagitis prilosec  Follow-up: 1 year-see [facility] doc-requested copy of blood work   Hannah Beat, MD

## 2019-05-22 ENCOUNTER — Telehealth: Payer: Self-pay | Admitting: Family Medicine

## 2019-05-22 NOTE — Telephone Encounter (Signed)
Patient is calling and states that he has pulled a muscle in his hip and has muscle relaxer that Dr. Luan Pulling has prescribed, he wants to discuss with the nurse about taking these.

## 2019-05-22 NOTE — Telephone Encounter (Signed)
Patient has Baclofen 10mg  that Dr. Luan Pulling had prescribed can he take these for muscle pain?

## 2019-05-23 NOTE — Telephone Encounter (Signed)
Baclofen is a muscle relaxer-avoid taking and operating heavy equipment. Better to take at night prior to bedtime

## 2019-05-23 NOTE — Telephone Encounter (Signed)
Patient Is aware of recommendation

## 2019-08-21 DIAGNOSIS — Z23 Encounter for immunization: Secondary | ICD-10-CM | POA: Diagnosis not present

## 2019-10-03 DIAGNOSIS — M25562 Pain in left knee: Secondary | ICD-10-CM | POA: Diagnosis not present

## 2019-10-03 DIAGNOSIS — D481 Neoplasm of uncertain behavior of connective and other soft tissue: Secondary | ICD-10-CM | POA: Diagnosis not present

## 2019-10-03 DIAGNOSIS — M25561 Pain in right knee: Secondary | ICD-10-CM | POA: Diagnosis not present

## 2019-11-21 ENCOUNTER — Ambulatory Visit (INDEPENDENT_AMBULATORY_CARE_PROVIDER_SITE_OTHER): Payer: Medicare Other | Admitting: Ophthalmology

## 2019-11-21 ENCOUNTER — Other Ambulatory Visit: Payer: Self-pay

## 2019-11-21 ENCOUNTER — Encounter (INDEPENDENT_AMBULATORY_CARE_PROVIDER_SITE_OTHER): Payer: Self-pay | Admitting: Ophthalmology

## 2019-11-21 DIAGNOSIS — H33102 Unspecified retinoschisis, left eye: Secondary | ICD-10-CM | POA: Diagnosis not present

## 2019-11-21 DIAGNOSIS — H353112 Nonexudative age-related macular degeneration, right eye, intermediate dry stage: Secondary | ICD-10-CM | POA: Diagnosis not present

## 2019-11-21 NOTE — Progress Notes (Signed)
11/21/2019     CHIEF COMPLAINT Patient presents for Blurred Vision   HISTORY OF PRESENT ILLNESS: Scott Zamora is a 85 y.o. male who presents to the clinic today for:   HPI    Blurred Vision    In both eyes.  Onset was gradual.  Severity is mild.  Occurring constantly.  It is worse when reading.  Since onset it is stable.  Associated symptoms include Negative for eye pain.  Treatments tried include no treatments.  Response to treatment was no improvement.  I, the attending physician,  performed the HPI with the patient and updated documentation appropriately.          Comments    Pt was seen at the [facility] on 11/09/19. Pt states they told him his "retina is splitting". Pt is a previous pt of Dr. Gershon Crane. Pt states he did need an updated gls rx and has some trouble reading without gls. Pt states he has been having trouble with depth perception. Denies FOL and floaters.       Last edited by Tilda Franco on 11/21/2019  3:06 PM. (History)      Referring physician: Maryruth Hancock, MD [address] [place],  [place] [address]  HISTORICAL INFORMATION:   Selected notes from the MEDICAL RECORD NUMBER       CURRENT MEDICATIONS: No current outpatient medications on file. (Ophthalmic Drugs)   No current facility-administered medications for this visit. (Ophthalmic Drugs)   Current Outpatient Medications (Other)  Medication Sig  . acetaminophen (TYLENOL) 500 MG tablet Take 500 mg by mouth at bedtime. Reported on 09/26/2015  . ammonium lactate (AMLACTIN) 12 % cream Apply topically as needed for dry skin.  Marland Kitchen ascorbic acid (VITAMIN C) 250 MG tablet Take 250 mg by mouth daily.  Marland Kitchen aspirin EC 81 MG tablet Take 81 mg by mouth daily.  . Cholecalciferol (VITAMIN D-3) 1000 UNITS CAPS Take 1,000 Units by mouth daily.  . COD LIVER OIL PO Take 1 tablet by mouth daily as needed (for supplementation). Reported on 09/26/2015  . Coenzyme Q10 (COQ-10 PO) Take 1 tablet by mouth daily. Reported on  09/26/2015  . diazepam (VALIUM) 2 MG tablet Take 1 tablet (2 mg total) by mouth every 6 (six) hours as needed for muscle spasms or sedation. (Patient not taking: Reported on 09/26/2015)  . Digestive Enzymes (PAPAYA AND ENZYMES PO) Take 2 tablets by mouth daily as needed (for digestive support). Reported on 09/26/2015  . doxycycline (VIBRAMYCIN) 100 MG capsule Take 1 capsule (100 mg total) by mouth 2 (two) times daily. (Patient not taking: Reported on 09/03/2017)  . ferrous sulfate 325 (65 FE) MG tablet Take 325 mg by mouth daily.  . flunisolide (NASALIDE) 25 MCG/ACT (0.025%) SOLN Inhale 2 sprays into the lungs daily as needed. Reported on 09/26/2015  . fluticasone (VERAMYST) 27.5 MCG/SPRAY nasal spray Place 2 sprays into the nose daily.  . hydrochlorothiazide (HYDRODIURIL) 25 MG tablet Take 12.5 mg by mouth daily.  Marland Kitchen loratadine (CLARITIN) 10 MG tablet Take 10 mg by mouth daily as needed. For allergies  . naproxen (NAPROSYN) 500 MG tablet Take 500 mg by mouth 2 (two) times daily as needed. For pain  . omeprazole (PRILOSEC) 20 MG capsule Take 20 mg by mouth daily.  Marland Kitchen oxyCODONE-acetaminophen (PERCOCET) 5-325 MG tablet Take 1-2 tablets by mouth every 4 (four) hours as needed. (Patient not taking: Reported on 09/03/2017)  . POTASSIUM PO Take 1 tablet by mouth daily. Reported on 09/26/2015  .  sildenafil (VIAGRA) 100 MG tablet Take 50 mg by mouth daily as needed. Reported on 09/26/2015  . tamsulosin (FLOMAX) 0.4 MG CAPS capsule Take 0.4 mg by mouth daily after supper.  . TURMERIC PO Take 300 mg by mouth daily. Reported on 09/26/2015  . verapamil (CALAN-SR) 240 MG CR tablet Take 240 mg by mouth at bedtime.  . vitamin B-12 (CYANOCOBALAMIN) 250 MCG tablet Take 250 mcg by mouth 2 (two) times a week. Reported on 09/26/2015   No current facility-administered medications for this visit. (Other)      REVIEW OF SYSTEMS:    ALLERGIES No Known Allergies  PAST MEDICAL HISTORY Past Medical History:  Diagnosis  Date  . Arthritis   . Dry skin   . GERD (gastroesophageal reflux disease)   . Hypertension   . Inguinal hernia, left   . Seasonal allergies   . Sleep apnea    has CPAP but does not wear it  . Vertigo    hx of   Past Surgical History:  Procedure Laterality Date  . CATARACT EXTRACTION W/PHACO Bilateral    Dr. Gershon Crane  . COLONOSCOPY W/ POLYPECTOMY    . EYE SURGERY Bilateral   . HERNIA REPAIR    . INGUINAL HERNIA REPAIR Left 04/18/2014   Procedure: HERNIA REPAIR INGUINAL ADULT;  Surgeon: Jamesetta So, MD;  Location: [facility];  Service: General;  Laterality: Left;  . INSERTION OF MESH Left 04/18/2014   Procedure: INSERTION OF MESH;  Surgeon: Jamesetta So, MD;  Location: [facility];  Service: General;  Laterality: Left;  . KNEE ARTHROSCOPY     left knee  . TOTAL SHOULDER ARTHROPLASTY  08/11/2012   Dr Justice Britain  . TOTAL SHOULDER ARTHROPLASTY  08/11/2012   Procedure: TOTAL SHOULDER ARTHROPLASTY;  Surgeon: Marin Shutter, MD;  Location: [facility];  Service: Orthopedics;  Laterality: Left;  . TOTAL SHOULDER ARTHROPLASTY Right 09/12/2015   Procedure: RIGHT TOTAL SHOULDER ARTHROPLASTY;  Surgeon: Justice Britain, MD;  Location: [facility];  Service: Orthopedics;  Laterality: Right;    FAMILY HISTORY History reviewed. No pertinent family history.  SOCIAL HISTORY Social History   Tobacco Use  . Smoking status: Never Smoker  . Smokeless tobacco: Never Used  Substance Use Topics  . Alcohol use: No  . Drug use: No         OPHTHALMIC EXAM:  Base Eye Exam    Visual Acuity (Snellen - Linear)      Right Left   Dist cc 20/30 20/25 -1   Correction: Glasses       Tonometry (Tonopen, 3:09 PM)      Right Left   Pressure 17 16       Pupils      Pupils Dark Light Shape React APD   Right PERRL 3 3 Round Minimal None   Left PERRL 3 3 Round Minimal None       Visual Fields (Counting fingers)      Left Right    Full Full       Neuro/Psych    Oriented x3: Yes   Mood/Affect: Normal        Dilation    Both eyes: 1.0% Mydriacyl, 2.5% Phenylephrine @ 3:09 PM        Slit Lamp and Fundus Exam    External Exam      Right Left   External Normal Normal       Slit Lamp Exam      Right Left   Lids/Lashes Normal  Normal   Conjunctiva/Sclera White and quiet White and quiet   Cornea Clear Clear   Anterior Chamber Deep and quiet Deep and quiet   Iris Round and reactive Round and reactive   Lens Posterior chamber intraocular lens Posterior chamber intraocular lens   Anterior Vitreous Normal Normal       Fundus Exam      Right Left   Posterior Vitreous Normal Normal   Disc Normal Normal   C/D Ratio 0.4 0.5   Macula Normal Normal   Vessels Normal Normal   Periphery Normal Peripheral retinoschisis,, extends from 1030 -130 superiorly, with no inner or outer retinal breaks          IMAGING AND PROCEDURES  Imaging and Procedures for 11/21/19  OCT, Retina - OU - Both Eyes       Right Eye Quality was good. Scan locations included subfoveal. Central Foveal Thickness: 300. Findings include normal observations, retinal drusen .   Left Eye Quality was good. Scan locations included subfoveal. Central Foveal Thickness: 302. Findings include normal observations.   Notes With dry age-related macular degeneration       Color Fundus Photography Optos - OU - Both Eyes       Right Eye Progression has no prior data. Disc findings include normal observations. Macula : drusen. Vessels : normal observations. Periphery : normal observations.   Left Eye Progression has no prior data. Disc findings include normal observations. Macula : normal observations. Vessels : normal observations. Periphery : normal observations.   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  Age-related macular degeneration OD, intermediate type                ASSESSMENT/PLAN:  Intermediate stage nonexudative age-related macular degeneration of right eye The nature of  age--related macular degeneration was discussed with the patient as well as the distinction between dry and wet types. Checking an Amsler Grid daily with advice to return immediately should a distortion develop, was given to the patient. The patient 's smoking status now and in the past was determined and advice based on the AREDS study was provided regarding the consumption of antioxidant supplements. AREDS 2 vitamin formulation was recommended. Consumption of dark leafy vegetables and fresh fruits of various colors was recommended. Treatment modalities for wet macular degeneration particularly the use of intravitreal injections of anti-blood vessel growth factors was discussed with the patient. Avastin, Lucentis, and Eylea are the available options. On occasion, therapy includes the use of photodynamic therapy and thermal laser. Stressed to the patient do not rub eyes. All patient questions were answered.  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  The superotemporal location and is of no pathologic concern.  There are no inner outer holes.  Observation alone is warranted.  No specific therapy is required.      ICD-10-CM   1. Left retinoschisis  H33.102 Color Fundus Photography Optos - OU - Both Eyes  2. Intermediate stage nonexudative age-related macular degeneration of right eye  H35.3112 OCT, Retina - OU - Both Eyes    1.  Retinoschisis of the left eye, in the superotemporal periphery.  There are no retinal holes or tears.  There is no retinal detachment.  Observation alone is warranted  2.  Intermediate age-related macular degeneration right eye worse than left, no specific therapy and warranted at this time other than vitamin supplementation  3.  Ophthalmic Meds Ordered this visit:  No orders of the defined types were placed in  this encounter.      Return in about 1 year (around 11/20/2020) for DILATE OU, COLOR FP.  There are no Patient Instructions on file for  this visit.   Explained the diagnoses, plan, and follow up with the patient and they expressed understanding.  Patient expressed understanding of the importance of proper follow up care.   Clent Demark Keryl Gholson M.D. Diseases & Surgery of the Retina and Vitreous [facility] [place] 11/21/19     Abbreviations: M myopia (nearsighted); A astigmatism; H hyperopia (farsighted); P presbyopia; Mrx spectacle prescription;  CTL contact lenses; OD right eye; OS left eye; OU both eyes  XT exotropia; ET esotropia; PEK punctate epithelial keratitis; PEE punctate epithelial erosions; DES dry eye syndrome; MGD meibomian gland dysfunction; ATs artificial tears; PFAT's preservative free artificial tears; [place] nuclear sclerotic cataract; PSC posterior subcapsular cataract; ERM epi-retinal membrane; PVD posterior vitreous detachment; RD retinal detachment; DM diabetes mellitus; DR diabetic retinopathy; NPDR non-proliferative diabetic retinopathy; PDR proliferative diabetic retinopathy; CSME clinically significant macular edema; DME diabetic macular edema; dbh dot blot hemorrhages; CWS cotton wool spot; POAG primary open angle glaucoma; C/D cup-to-disc ratio; HVF humphrey visual field; GVF goldmann visual field; OCT optical coherence tomography; IOP intraocular pressure; BRVO Branch retinal vein occlusion; CRVO central retinal vein occlusion; CRAO central retinal artery occlusion; BRAO branch retinal artery occlusion; RT retinal tear; SB scleral buckle; PPV pars plana vitrectomy; VH Vitreous hemorrhage; PRP panretinal laser photocoagulation; IVK intravitreal kenalog; VMT vitreomacular traction; MH Macular hole;  NVD neovascularization of the disc; NVE neovascularization elsewhere; AREDS age related eye disease study; ARMD age related macular degeneration; POAG primary open angle glaucoma; EBMD epithelial/anterior basement membrane dystrophy; ACIOL anterior chamber intraocular lens; IOL intraocular lens; PCIOL posterior  chamber intraocular lens; Phaco/IOL phacoemulsification with intraocular lens placement; [place] photorefractive keratectomy; LASIK laser assisted in situ keratomileusis; HTN hypertension; DM diabetes mellitus; COPD chronic obstructive pulmonary disease

## 2019-11-21 NOTE — Assessment & Plan Note (Signed)

## 2019-11-21 NOTE — Assessment & Plan Note (Signed)
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  The superotemporal location and is of no pathologic concern.  There are no inner outer holes.  Observation alone is warranted.  No specific therapy is required.

## 2020-02-05 DIAGNOSIS — R350 Frequency of micturition: Secondary | ICD-10-CM | POA: Diagnosis not present

## 2020-02-05 DIAGNOSIS — R7303 Prediabetes: Secondary | ICD-10-CM | POA: Diagnosis not present

## 2020-02-05 DIAGNOSIS — R399 Unspecified symptoms and signs involving the genitourinary system: Secondary | ICD-10-CM | POA: Diagnosis not present

## 2020-02-05 DIAGNOSIS — Z13220 Encounter for screening for lipoid disorders: Secondary | ICD-10-CM | POA: Diagnosis not present

## 2020-02-05 DIAGNOSIS — Z131 Encounter for screening for diabetes mellitus: Secondary | ICD-10-CM | POA: Diagnosis not present

## 2020-02-05 DIAGNOSIS — Z6823 Body mass index (BMI) 23.0-23.9, adult: Secondary | ICD-10-CM | POA: Diagnosis not present

## 2020-02-05 DIAGNOSIS — M5412 Radiculopathy, cervical region: Secondary | ICD-10-CM | POA: Diagnosis not present

## 2020-02-05 DIAGNOSIS — I10 Essential (primary) hypertension: Secondary | ICD-10-CM | POA: Diagnosis not present

## 2020-02-05 DIAGNOSIS — N401 Enlarged prostate with lower urinary tract symptoms: Secondary | ICD-10-CM | POA: Diagnosis not present

## 2020-02-05 DIAGNOSIS — E559 Vitamin D deficiency, unspecified: Secondary | ICD-10-CM | POA: Diagnosis not present

## 2020-02-05 DIAGNOSIS — G4733 Obstructive sleep apnea (adult) (pediatric): Secondary | ICD-10-CM | POA: Diagnosis not present

## 2020-03-14 ENCOUNTER — Encounter (HOSPITAL_COMMUNITY): Payer: Self-pay | Admitting: Hematology

## 2020-03-14 ENCOUNTER — Other Ambulatory Visit: Payer: Self-pay

## 2020-03-14 ENCOUNTER — Inpatient Hospital Stay (HOSPITAL_COMMUNITY): Payer: Medicare Other | Attending: Hematology | Admitting: Hematology

## 2020-03-14 ENCOUNTER — Inpatient Hospital Stay (HOSPITAL_COMMUNITY): Payer: Medicare Other

## 2020-03-14 VITALS — BP 155/84 | HR 77 | Temp 97.3°F | Resp 18 | Wt 169.1 lb

## 2020-03-14 DIAGNOSIS — Z800 Family history of malignant neoplasm of digestive organs: Secondary | ICD-10-CM | POA: Insufficient documentation

## 2020-03-14 DIAGNOSIS — Z8051 Family history of malignant neoplasm of kidney: Secondary | ICD-10-CM | POA: Diagnosis not present

## 2020-03-14 DIAGNOSIS — R918 Other nonspecific abnormal finding of lung field: Secondary | ICD-10-CM | POA: Insufficient documentation

## 2020-03-14 DIAGNOSIS — D696 Thrombocytopenia, unspecified: Secondary | ICD-10-CM | POA: Insufficient documentation

## 2020-03-14 LAB — CBC WITH DIFFERENTIAL/PLATELET
Abs Immature Granulocytes: 0.01 10*3/uL (ref 0.00–0.07)
Basophils Absolute: 0 10*3/uL (ref 0.0–0.1)
Basophils Relative: 1 %
Eosinophils Absolute: 0.2 10*3/uL (ref 0.0–0.5)
Eosinophils Relative: 4 %
HCT: 42.7 % (ref 39.0–52.0)
Hemoglobin: 13.8 g/dL (ref 13.0–17.0)
Immature Granulocytes: 0 %
Lymphocytes Relative: 21 %
Lymphs Abs: 1.2 10*3/uL (ref 0.7–4.0)
MCH: 30.3 pg (ref 26.0–34.0)
MCHC: 32.3 g/dL (ref 30.0–36.0)
MCV: 93.8 fL (ref 80.0–100.0)
Monocytes Absolute: 0.5 10*3/uL (ref 0.1–1.0)
Monocytes Relative: 9 %
Neutro Abs: 3.7 10*3/uL (ref 1.7–7.7)
Neutrophils Relative %: 65 %
Platelets: 192 10*3/uL (ref 150–400)
RBC: 4.55 MIL/uL (ref 4.22–5.81)
RDW: 13.4 % (ref 11.5–15.5)
WBC: 5.6 10*3/uL (ref 4.0–10.5)
nRBC: 0 % (ref 0.0–0.2)

## 2020-03-14 LAB — COMPREHENSIVE METABOLIC PANEL
ALT: 15 U/L (ref 0–44)
AST: 16 U/L (ref 15–41)
Albumin: 4 g/dL (ref 3.5–5.0)
Alkaline Phosphatase: 68 U/L (ref 38–126)
Anion gap: 10 (ref 5–15)
BUN: 14 mg/dL (ref 8–23)
CO2: 28 mmol/L (ref 22–32)
Calcium: 9 mg/dL (ref 8.9–10.3)
Chloride: 101 mmol/L (ref 98–111)
Creatinine, Ser: 0.85 mg/dL (ref 0.61–1.24)
GFR calc Af Amer: >60 mL/min (ref >60)
GFR calc non Af Amer: >60 mL/min (ref >60)
Glucose, Bld: 122 mg/dL — ABNORMAL HIGH (ref 70–99)
Potassium: 3.7 mmol/L (ref 3.5–5.1)
Sodium: 139 mmol/L (ref 135–145)
Total Bilirubin: 0.7 mg/dL (ref 0.3–1.2)
Total Protein: 7.4 g/dL (ref 6.5–8.1)

## 2020-03-14 LAB — VITAMIN B12: Vitamin B-12: 821 pg/mL (ref 180–914)

## 2020-03-14 LAB — PLATELET BY CITRATE

## 2020-03-14 LAB — FOLATE: Folate: 16.5 ng/mL (ref >5.9)

## 2020-03-14 LAB — LACTATE DEHYDROGENASE: LDH: 156 U/L (ref 98–192)

## 2020-03-14 NOTE — Progress Notes (Signed)
CONSULT NOTE  Patient Care Team: Corum, Rex Kras, MD as PCP - General (Family Medicine)  CHIEF COMPLAINTS/PURPOSE OF CONSULTATION: Thrombocytopenia  HISTORY OF PRESENTING ILLNESS:  Scott Zamora 85 y.o. male was sent here by his PCP for thrombocytopenia.  Documented platelet count was 69 at the end of July 2021.  Patient reports he had low platelets 50 years ago when he initially enlisted in the [employer].  He was never told how low and had frequent checks to monitor then.  Nothing was ever done about them.  Through the years he reports his platelet count improved and he had multiple surgeries on shoulders and hernia and never had issues with bleeding or his platelet count.  He reports he had his Covid vaccines in January and February 2021.  He had his booster shot at the end of August 2021.  He also reports he has had multiple tick bites through the years.  He denies any bright red bleeding per rectum or melena.  He denies any bleeding such as epistasis, hematuria or hematochezia.  He denies ever needing a blood transfusion.  He reports his last colonoscopy was about 5 years ago with the [facility].  It was reportedly normal.  He denies any alcohol, smoking or illicit drug use.  He denies any form of pica and eats a variety of diet.  He denies any new medications in the past 6 months.  He denies any p.o. steroid use or injections.  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  His spleen is still intact.  Patient denies a prior history or diagnosis of cancer.  Patient reports a positive family history for sister with colon cancer and a brother with kidney cancer.  Patient lives at home with his wife and performs all his own ADLs.   MEDICAL HISTORY:  Past Medical History:  Diagnosis Date  . Arthritis   . Dry skin   . GERD (gastroesophageal reflux disease)   . Hypertension   . Inguinal hernia, left   . Seasonal allergies   . Sleep  apnea    has CPAP but does not wear it  . Vertigo    hx of    SURGICAL HISTORY: Past Surgical History:  Procedure Laterality Date  . CATARACT EXTRACTION W/PHACO Bilateral    Dr. Gershon Crane  . COLONOSCOPY W/ POLYPECTOMY    . EYE SURGERY Bilateral   . HERNIA REPAIR    . INGUINAL HERNIA REPAIR Left 04/18/2014   Procedure: HERNIA REPAIR INGUINAL ADULT;  Surgeon: Jamesetta So, MD;  Location: [facility];  Service: General;  Laterality: Left;  . INSERTION OF MESH Left 04/18/2014   Procedure: INSERTION OF MESH;  Surgeon: Jamesetta So, MD;  Location: [facility];  Service: General;  Laterality: Left;  . KNEE ARTHROSCOPY     left knee  . TOTAL SHOULDER ARTHROPLASTY  08/11/2012   Dr Justice Britain  . TOTAL SHOULDER ARTHROPLASTY  08/11/2012   Procedure: TOTAL SHOULDER ARTHROPLASTY;  Surgeon: Marin Shutter, MD;  Location: [facility];  Service: Orthopedics;  Laterality: Left;  . TOTAL SHOULDER ARTHROPLASTY Right 09/12/2015   Procedure: RIGHT TOTAL SHOULDER ARTHROPLASTY;  Surgeon: Justice Britain, MD;  Location: [facility];  Service: Orthopedics;  Laterality: Right;    SOCIAL HISTORY: Social History   Socioeconomic History  . Marital status: Married    Spouse name: Mickel Baas  . Number of children: 3  . Years  of education: Not on file  . Highest education level: Not on file  Occupational History  . Occupation: retired  Tobacco Use  . Smoking status: Never Smoker  . Smokeless tobacco: Never Used  Substance and Sexual Activity  . Alcohol use: No  . Drug use: No  . Sexual activity: Not on file  Other Topics Concern  . Not on file  Social History Narrative  . Not on file   Social Determinants of Health   Financial Resource Strain:   . Difficulty of Paying Living Expenses: Not on file  Food Insecurity:   . Worried About Charity fundraiser in the Last Year: Not on file  . Ran Out of Food in the Last Year: Not on file  Transportation Needs:   . Lack of Transportation (Medical): Not on file  . Lack of  Transportation (Non-Medical): Not on file  Physical Activity:   . Days of Exercise per Week: Not on file  . Minutes of Exercise per Session: Not on file  Stress:   . Feeling of Stress : Not on file  Social Connections:   . Frequency of Communication with Friends and Family: Not on file  . Frequency of Social Gatherings with Friends and Family: Not on file  . Attends Religious Services: Not on file  . Active Member of Clubs or Organizations: Not on file  . Attends Archivist Meetings: Not on file  . Marital Status: Not on file  Intimate Partner Violence:   . Fear of Current or Ex-Partner: Not on file  . Emotionally Abused: Not on file  . Physically Abused: Not on file  . Sexually Abused: Not on file    FAMILY HISTORY: Family History  Problem Relation Age of Onset  . Hypertension Mother   . Diabetes Mother   . Cancer Sister   . Hypertension Brother   . Diabetes Brother   . Hypertension Sister     ALLERGIES:  has No Known Allergies.  MEDICATIONS:  Current Outpatient Medications  Medication Sig Dispense Refill  . ascorbic acid (VITAMIN C) 250 MG tablet Take 250 mg by mouth daily.    Marland Kitchen aspirin EC 81 MG tablet Take 81 mg by mouth daily.    . Cholecalciferol (VITAMIN D-3) 1000 UNITS CAPS Take 1,000 Units by mouth daily.    . COD LIVER OIL PO Take 1 tablet by mouth daily as needed (for supplementation). Reported on 09/26/2015    . Coenzyme Q10 (COQ-10 PO) Take 1 tablet by mouth daily. Reported on 09/26/2015    . ferrous sulfate 325 (65 FE) MG tablet Take 325 mg by mouth daily.    . fluticasone (VERAMYST) 27.5 MCG/SPRAY nasal spray Place 2 sprays into the nose daily.    . hydrochlorothiazide (HYDRODIURIL) 25 MG tablet Take 12.5 mg by mouth daily.    Marland Kitchen loratadine (CLARITIN) 10 MG tablet Take 10 mg by mouth daily as needed. For allergies    . omeprazole (PRILOSEC) 20 MG capsule Take 20 mg by mouth daily.    Marland Kitchen POTASSIUM PO Take 1 tablet by mouth daily. Reported on 09/26/2015     . tamsulosin (FLOMAX) 0.4 MG CAPS capsule Take 0.4 mg by mouth daily after supper.    . TURMERIC PO Take 300 mg by mouth daily. Reported on 09/26/2015    . verapamil (CALAN-SR) 240 MG CR tablet Take 240 mg by mouth at bedtime.    . vitamin B-12 (CYANOCOBALAMIN) 250 MCG tablet Take 250 mcg by mouth  2 (two) times a week. Reported on 09/26/2015    . acetaminophen (TYLENOL) 500 MG tablet Take 500 mg by mouth at bedtime. Reported on 09/26/2015 (Patient not taking: Reported on 03/14/2020)    . ammonium lactate (AMLACTIN) 12 % cream Apply topically as needed for dry skin. (Patient not taking: Reported on 03/14/2020)    . diazepam (VALIUM) 2 MG tablet Take 1 tablet (2 mg total) by mouth every 6 (six) hours as needed for muscle spasms or sedation. (Patient not taking: Reported on 09/26/2015) 30 tablet 1  . Digestive Enzymes (PAPAYA AND ENZYMES PO) Take 2 tablets by mouth daily as needed (for digestive support). Reported on 09/26/2015 (Patient not taking: Reported on 03/14/2020)    . flunisolide (NASALIDE) 25 MCG/ACT (0.025%) SOLN Inhale 2 sprays into the lungs daily as needed. Reported on 09/26/2015 (Patient not taking: Reported on 03/14/2020)     No current facility-administered medications for this visit.    REVIEW OF SYSTEMS:   Constitutional: Denies fevers, chills or abnormal night sweats Respiratory: Denies cough, dyspnea or wheezes Cardiovascular: Denies palpitation, chest discomfort or lower extremity swelling Gastrointestinal:  Denies nausea, heartburn or change in bowel habits Skin: Denies abnormal skin rashes Lymphatics: Denies new lymphadenopathy or easy bruising Neurological:Denies tingling or new weaknesses, + numbness in his hands Behavioral/Psych: Mood is stable, no new changes  All other systems were reviewed with the patient and are negative.  PHYSICAL EXAMINATION: ECOG PERFORMANCE STATUS: 0 - Asymptomatic  Vitals:   03/14/20 0929  BP: (!) 155/84  Pulse: 77  Resp: 18  Temp: (!) 97.3 F  (36.3 C)  SpO2: 99%   Filed Weights   03/14/20 0929  Weight: 169 lb 1.5 oz (76.7 kg)    GENERAL:alert, no distress and comfortable SKIN: skin color, texture, turgor are normal, no rashes or significant lesions NECK: supple, thyroid normal size, non-tender, without nodularity LYMPH:  no palpable lymphadenopathy in the cervical, axillary or inguinal LUNGS: clear to auscultation and percussion with normal breathing effort HEART: regular rate & rhythm and no murmurs and no lower extremity edema ABDOMEN:abdomen soft, non-tender and normal bowel sounds Musculoskeletal:no cyanosis of digits and no clubbing  PSYCH: alert & oriented x 3 with fluent speech NEURO: no focal motor/sensory deficits  LABORATORY DATA:  I have reviewed the data as listed No results found for this or any previous visit (from the past 2160 hour(s)).  RADIOGRAPHIC STUDIES: I have personally reviewed the radiological images as listed and agreed with the findings in the report.  ASSESSMENT & PLAN:  1.  Moderate thrombocytopenia: -Patient seen at the request of Dr. Bartolo Darter for further work-up and management of thrombocytopenia. -CBC on 02/05/2020 showed platelet count 69, white count 5.3, hemoglobin 14.2 with normal differential. -Patient reports having history of low platelets 40 to 50 years ago. -Patient underwent bilateral shoulder surgeries, last one 10 years ago without any bleeding issues. -No prior history of splenectomy.  CT chest on 06/02/2018 shows scattered small pulmonary nodules, largest 6 mm.  PLAN:  1.  Moderate thrombocytopenia: -Differential diagnosis includes pseudothrombocytopenia. -We will repeat his CBC today.  We will check platelet count and sodium citrate tube.  We will also check for nutritional deficiencies and connective tissue disorders. -We will also rule out infectious etiology by checking hepatitis panel.  Will check serum protein electrophoresis. -We will check testosterone level as he  is complaining of night sweats.  2.  Lung nodules: -He had scattered solid lung nodules on CT of the chest on 06/02/2018. -  If he did not have follow-up CT elsewhere, we will consider repeating CT scan.   All questions were answered. The patient knows to call the clinic with any problems, questions or concerns.      Glennie Isle, NP-C 03/14/20 10:34 AM

## 2020-03-15 LAB — PROTEIN ELECTROPHORESIS, SERUM
A/G Ratio: 1.2 (ref 0.7–1.7)
Albumin ELP: 3.7 g/dL (ref 2.9–4.4)
Alpha-1-Globulin: 0.2 g/dL (ref 0.0–0.4)
Alpha-2-Globulin: 0.7 g/dL (ref 0.4–1.0)
Beta Globulin: 0.9 g/dL (ref 0.7–1.3)
Gamma Globulin: 1.4 g/dL (ref 0.4–1.8)
Globulin, Total: 3.2 g/dL (ref 2.2–3.9)
Total Protein ELP: 6.9 g/dL (ref 6.0–8.5)

## 2020-03-15 LAB — VITAMIN D 25 HYDROXY (VIT D DEFICIENCY, FRACTURES): Vit D, 25-Hydroxy: 54.46 ng/mL (ref 30–100)

## 2020-03-15 LAB — HEPATITIS B CORE ANTIBODY, IGM: Hep B C IgM: NONREACTIVE

## 2020-03-15 LAB — HEPATITIS B SURFACE ANTIGEN: Hepatitis B Surface Ag: NONREACTIVE

## 2020-03-15 LAB — TESTOSTERONE: Testosterone: 403 ng/dL (ref 264–916)

## 2020-03-15 LAB — HEPATITIS B SURFACE ANTIBODY,QUALITATIVE: Hep B S Ab: NONREACTIVE

## 2020-03-15 LAB — RHEUMATOID FACTOR: Rheumatoid fact SerPl-aCnc: <10 IU/mL (ref 0.0–13.9)

## 2020-03-15 LAB — ANTINUCLEAR ANTIBODIES, IFA: ANA Ab, IFA: NEGATIVE

## 2020-03-17 LAB — TESTOSTERONE, FREE: Testosterone, Free: 4.9 pg/mL — ABNORMAL LOW (ref 6.6–18.1)

## 2020-03-19 LAB — METHYLMALONIC ACID, SERUM: Methylmalonic Acid, Quantitative: 91 nmol/L (ref 0–378)

## 2020-03-22 LAB — COPPER, SERUM: Copper: 101 ug/dL (ref 69–132)

## 2020-04-04 ENCOUNTER — Other Ambulatory Visit: Payer: Self-pay

## 2020-04-04 ENCOUNTER — Inpatient Hospital Stay (HOSPITAL_BASED_OUTPATIENT_CLINIC_OR_DEPARTMENT_OTHER): Payer: Medicare Other | Admitting: Hematology

## 2020-04-04 VITALS — BP 165/86 | HR 80 | Temp 97.0°F | Resp 18 | Wt 169.3 lb

## 2020-04-04 DIAGNOSIS — Z800 Family history of malignant neoplasm of digestive organs: Secondary | ICD-10-CM | POA: Diagnosis not present

## 2020-04-04 DIAGNOSIS — D696 Thrombocytopenia, unspecified: Secondary | ICD-10-CM | POA: Diagnosis not present

## 2020-04-04 DIAGNOSIS — R918 Other nonspecific abnormal finding of lung field: Secondary | ICD-10-CM | POA: Diagnosis not present

## 2020-04-04 DIAGNOSIS — Z8051 Family history of malignant neoplasm of kidney: Secondary | ICD-10-CM | POA: Diagnosis not present

## 2020-04-04 NOTE — Progress Notes (Signed)
[place], [place] [address]   CLINIC:  Medical Oncology/Hematology  PCP:  Maryruth Hancock, [place] /  [place] [address]  [phone]  REASON FOR VISIT:  Follow-up for thrombocytopenia  PRIOR THERAPY: None  CURRENT THERAPY: Observation  INTERVAL HISTORY:  Mr. Scott Zamora, a 85 y.o. male, returns for routine follow-up for his thrombocytopenia. Scott Zamora was last seen on 03/14/2020.  Today he is accompanied by his wife. He reports feeling well today and denies having any issues since the last visit. He denies having any new bone pains.  REVIEW OF SYSTEMS:  Review of Systems  Constitutional: Positive for fatigue (mild). Negative for appetite change.  HENT:   Positive for trouble swallowing (especially w/ big pills).   Respiratory: Positive for cough (occasional).   Genitourinary: Positive for difficulty urinating (first urination of day hardest to start).   Musculoskeletal: Positive for arthralgias (knees pain) and neck pain (9/10 L neck pain).  All other systems reviewed and are negative.   PAST MEDICAL/SURGICAL HISTORY:  Past Medical History:  Diagnosis Date  . Arthritis   . Dry skin   . GERD (gastroesophageal reflux disease)   . Hypertension   . Inguinal hernia, left   . Seasonal allergies   . Sleep apnea    has CPAP but does not wear it  . Vertigo    hx of   Past Surgical History:  Procedure Laterality Date  . CATARACT EXTRACTION W/PHACO Bilateral    Dr. Gershon Crane  . COLONOSCOPY W/ POLYPECTOMY    . EYE SURGERY Bilateral   . HERNIA REPAIR    . INGUINAL HERNIA REPAIR Left 04/18/2014   Procedure: HERNIA REPAIR INGUINAL ADULT;  Surgeon: Jamesetta So, MD;  Location: [facility];  Service: General;  Laterality: Left;  . INSERTION OF MESH Left 04/18/2014   Procedure: INSERTION OF MESH;  Surgeon: Jamesetta So, MD;  Location: [facility];  Service: General;  Laterality: Left;  . KNEE ARTHROSCOPY     left knee  . TOTAL SHOULDER  ARTHROPLASTY  08/11/2012   Dr Justice Britain  . TOTAL SHOULDER ARTHROPLASTY  08/11/2012   Procedure: TOTAL SHOULDER ARTHROPLASTY;  Surgeon: Marin Shutter, MD;  Location: [facility];  Service: Orthopedics;  Laterality: Left;  . TOTAL SHOULDER ARTHROPLASTY Right 09/12/2015   Procedure: RIGHT TOTAL SHOULDER ARTHROPLASTY;  Surgeon: Justice Britain, MD;  Location: [place] [facility];  Service: Orthopedics;  Laterality: Right;    SOCIAL HISTORY:  Social History   Socioeconomic History  . Marital status: Married    Spouse name: Mickel Baas  . Number of children: 3  . Years of education: Not on file  . Highest education level: Not on file  Occupational History  . Occupation: retired  Tobacco Use  . Smoking status: Never Smoker  . Smokeless tobacco: Never Used  Substance and Sexual Activity  . Alcohol use: No  . Drug use: No  . Sexual activity: Not on file  Other Topics Concern  . Not on file  Social History Narrative  . Not on file   Social Determinants of Health   Financial Resource Strain:   . Difficulty of Paying Living Expenses: Not on file  Food Insecurity:   . Worried About Charity fundraiser in the Last Year: Not on file  . Ran Out of Food in the Last Year: Not on file  Transportation Needs:   . Lack of Transportation (Medical): Not on file  . Lack  of Transportation (Non-Medical): Not on file  Physical Activity:   . Days of Exercise per Week: Not on file  . Minutes of Exercise per Session: Not on file  Stress:   . Feeling of Stress : Not on file  Social Connections:   . Frequency of Communication with Friends and Family: Not on file  . Frequency of Social Gatherings with Friends and Family: Not on file  . Attends Religious Services: Not on file  . Active Member of Clubs or Organizations: Not on file  . Attends Archivist Meetings: Not on file  . Marital Status: Not on file  Intimate Partner Violence:   . Fear of Current or Ex-Partner: Not on file  . Emotionally Abused: Not on file    . Physically Abused: Not on file  . Sexually Abused: Not on file    FAMILY HISTORY:  Family History  Problem Relation Age of Onset  . Hypertension Mother   . Diabetes Mother   . Cancer Sister   . Hypertension Brother   . Diabetes Brother   . Hypertension Sister     CURRENT MEDICATIONS:  Current Outpatient Medications  Medication Sig Dispense Refill  . acetaminophen (TYLENOL) 500 MG tablet Take 500 mg by mouth at bedtime. Reported on 09/26/2015    . ammonium lactate (AMLACTIN) 12 % cream Apply topically as needed for dry skin.     Marland Kitchen ascorbic acid (VITAMIN C) 250 MG tablet Take 250 mg by mouth daily.    Marland Kitchen aspirin EC 81 MG tablet Take 81 mg by mouth daily.    . Cholecalciferol (VITAMIN D-3) 1000 UNITS CAPS Take 1,000 Units by mouth daily.    . COD LIVER OIL PO Take 1 tablet by mouth daily as needed (for supplementation). Reported on 09/26/2015    . Coenzyme Q10 (COQ-10 PO) Take 1 tablet by mouth daily. Reported on 09/26/2015    . diazepam (VALIUM) 2 MG tablet Take 1 tablet (2 mg total) by mouth every 6 (six) hours as needed for muscle spasms or sedation. 30 tablet 1  . Digestive Enzymes (PAPAYA AND ENZYMES PO) Take 2 tablets by mouth daily as needed (for digestive support). Reported on 09/26/2015    . ferrous sulfate 325 (65 FE) MG tablet Take 325 mg by mouth daily.    . flunisolide (NASALIDE) 25 MCG/ACT (0.025%) SOLN Inhale 2 sprays into the lungs daily as needed. Reported on 09/26/2015    . fluticasone (VERAMYST) 27.5 MCG/SPRAY nasal spray Place 2 sprays into the nose daily.    . hydrochlorothiazide (HYDRODIURIL) 25 MG tablet Take 12.5 mg by mouth daily.    Marland Kitchen loratadine (CLARITIN) 10 MG tablet Take 10 mg by mouth daily as needed. For allergies    . omeprazole (PRILOSEC) 20 MG capsule Take 20 mg by mouth daily.    Marland Kitchen POTASSIUM PO Take 1 tablet by mouth daily. Reported on 09/26/2015    . tamsulosin (FLOMAX) 0.4 MG CAPS capsule Take 0.4 mg by mouth daily after supper.    . TURMERIC PO Take  300 mg by mouth daily. Reported on 09/26/2015    . verapamil (CALAN-SR) 240 MG CR tablet Take 240 mg by mouth at bedtime.    . vitamin B-12 (CYANOCOBALAMIN) 250 MCG tablet Take 250 mcg by mouth 2 (two) times a week. Reported on 09/26/2015     No current facility-administered medications for this visit.    ALLERGIES:  No Known Allergies  PHYSICAL EXAM:  Performance status (ECOG): 0 - Asymptomatic  Vitals:   04/04/20 1543  BP: (!) 165/86  Pulse: 80  Resp: 18  Temp: (!) 97 F (36.1 C)  SpO2: 99%   Wt Readings from Last 3 Encounters:  04/04/20 169 lb 5 oz (76.8 kg)  03/14/20 169 lb 1.5 oz (76.7 kg)  05/11/19 171 lb 3.2 oz (77.7 kg)   Physical Exam Vitals reviewed.  Constitutional:      Appearance: Normal appearance.  Neurological:     General: No focal deficit present.     Mental Status: He is alert and oriented to person, place, and time.  Psychiatric:        Mood and Affect: Mood normal.        Behavior: Behavior normal.     LABORATORY DATA:  I have reviewed the labs as listed.  CBC Latest Ref Rng & Units 03/14/2020 09/04/2015 04/13/2014  WBC 4.0 - 10.5 K/uL 5.6 5.6 5.3  Hemoglobin 13.0 - 17.0 g/dL 13.8 12.9(L) 13.8  Hematocrit 39 - 52 % 42.7 39.2 40.4  Platelets 150 - 400 K/uL 192 214 193   CMP Latest Ref Rng & Units 03/14/2020 09/04/2015 04/13/2014  Glucose 70 - 99 mg/dL 122(H) 89 90  BUN 8 - 23 mg/dL 14 12 13   Creatinine 0.61 - 1.24 mg/dL 0.85 0.94 0.97  Sodium 135 - 145 mmol/L 139 140 142  Potassium 3.5 - 5.1 mmol/L 3.7 4.1 3.9  Chloride 98 - 111 mmol/L 101 106 102  CO2 22 - 32 mmol/L 28 25 29   Calcium 8.9 - 10.3 mg/dL 9.0 9.4 9.1  Total Protein 6.5 - 8.1 g/dL 7.4 7.5 -  Total Bilirubin 0.3 - 1.2 mg/dL 0.7 0.6 -  Alkaline Phos 38 - 126 U/L 68 63 -  AST 15 - 41 U/L 16 27 -  ALT 0 - 44 U/L 15 21 -      Component Value Date/Time   RBC 4.55 03/14/2020 1045   MCV 93.8 03/14/2020 1045   MCH 30.3 03/14/2020 1045   MCHC 32.3 03/14/2020 1045   RDW 13.4 03/14/2020  1045   LYMPHSABS 1.2 03/14/2020 1045   MONOABS 0.5 03/14/2020 1045   EOSABS 0.2 03/14/2020 1045   BASOSABS 0.0 03/14/2020 1045   Lab Results  Component Value Date   LDH 156 03/14/2020   Lab Results  Component Value Date   VD25OH 54.46 03/14/2020    DIAGNOSTIC IMAGING:  I have independently reviewed the scans and discussed with the patient. No results found.   ASSESSMENT:  1.  Moderate thrombocytopenia: -Patient seen at the request of Dr. Bartolo Darter for further work-up and management of thrombocytopenia. -CBC on 02/05/2020 showed platelet count 69, white count 5.3, hemoglobin 14.2 with normal differential. -Patient reports having history of low platelets 40 to 50 years ago. -Patient underwent bilateral shoulder surgeries, last one 10 years ago without any bleeding issues. -No prior history of splenectomy.  CT chest on 06/02/2018 shows scattered small pulmonary nodules, largest 6 mm.   PLAN:  1.  Moderate thrombocytopenia: -I reviewed his labs from 03/14/2020.  Repeat platelet count was normal. -Nutritional deficiencies and infectious work-up was normal. -RTC 6 months with CBC.  If platelets continue to be normal, may D/C from clinic.  2.  Lung nodules: -He had scattered solid lung nodules on CT chest from 06/02/2018. -I will order a CT scan noncontrast in 6 months.  Orders placed this encounter:  Orders Placed This Encounter  Procedures  . CT Chest Wo Contrast  . CBC  . Platelet by  [place]     Derek Jack, MD [place] [phone]   I, Milinda Antis, am acting as a scribe for Dr. Sanda Linger.  I, Derek Jack MD, have reviewed the above documentation for accuracy and completeness, and I agree with the above.

## 2020-04-04 NOTE — Patient Instructions (Signed)
[facility] Discharge Instructions  You were seen today by Dr. Katragadda. He went over your recent results. You will be scheduled for a CT scan of your chest before your next visit. Dr. Katragadda will see you back in 6 months for labs and follow up.   Thank you for choosing [facility] at [facility] to provide your oncology and hematology care.  To afford each patient quality time with our provider, please arrive at least 15 minutes before your scheduled appointment time.   If you have a lab appointment with the Cancer Center please come in thru the Main Entrance and check in at the main information desk  You need to re-schedule your appointment should you arrive 10 or more minutes late.  We strive to give you quality time with our providers, and arriving late affects you and other patients whose appointments are after yours.  Also, if you no show three or more times for appointments you may be dismissed from the clinic at the providers discretion.     Again, thank you for choosing [facility].  Our hope is that these requests will decrease the amount of time that you wait before being seen by our physicians.       _____________________________________________________________  Should you have questions after your visit to  Cancer Center, please contact our office at [phone] between the hours of 8:00 a.m. and 4:30 p.m.  Voicemails left after 4:00 p.m. will not be returned until the following business day.  For prescription refill requests, have your pharmacy contact our office and allow 72 hours.    Cancer Center Support Programs:   > Cancer Support Group  2nd Tuesday of the month 1pm-2pm, Journey Room    

## 2020-04-22 DIAGNOSIS — Z23 Encounter for immunization: Secondary | ICD-10-CM | POA: Diagnosis not present

## 2020-10-02 ENCOUNTER — Inpatient Hospital Stay (HOSPITAL_COMMUNITY): Payer: Medicare Other | Attending: Hematology

## 2020-10-02 ENCOUNTER — Ambulatory Visit (HOSPITAL_COMMUNITY)
Admission: RE | Admit: 2020-10-02 | Discharge: 2020-10-02 | Disposition: A | Discharge status: Home / Self Care | Payer: Medicare Other | Source: Ambulatory Visit | Attending: Hematology | Admitting: Hematology

## 2020-10-02 ENCOUNTER — Other Ambulatory Visit: Payer: Self-pay

## 2020-10-02 DIAGNOSIS — M47814 Spondylosis without myelopathy or radiculopathy, thoracic region: Secondary | ICD-10-CM | POA: Diagnosis not present

## 2020-10-02 DIAGNOSIS — D696 Thrombocytopenia, unspecified: Secondary | ICD-10-CM

## 2020-10-02 DIAGNOSIS — I2510 Atherosclerotic heart disease of native coronary artery without angina pectoris: Secondary | ICD-10-CM | POA: Diagnosis not present

## 2020-10-02 DIAGNOSIS — I712 Thoracic aortic aneurysm, without rupture: Secondary | ICD-10-CM | POA: Diagnosis not present

## 2020-10-02 DIAGNOSIS — R918 Other nonspecific abnormal finding of lung field: Secondary | ICD-10-CM | POA: Diagnosis not present

## 2020-10-02 DIAGNOSIS — M47816 Spondylosis without myelopathy or radiculopathy, lumbar region: Secondary | ICD-10-CM | POA: Diagnosis not present

## 2020-10-02 LAB — CBC
HCT: 40.2 % (ref 39.0–52.0)
Hemoglobin: 13.1 g/dL (ref 13.0–17.0)
MCH: 31 pg (ref 26.0–34.0)
MCHC: 32.6 g/dL (ref 30.0–36.0)
MCV: 95 fL (ref 80.0–100.0)
Platelets: 190 10*3/uL (ref 150–400)
RBC: 4.23 MIL/uL (ref 4.22–5.81)
RDW: 14.3 % (ref 11.5–15.5)
WBC: 7.1 10*3/uL (ref 4.0–10.5)
nRBC: 0 % (ref 0.0–0.2)

## 2020-10-02 LAB — PLATELET BY CITRATE

## 2020-10-09 ENCOUNTER — Ambulatory Visit (HOSPITAL_COMMUNITY): Payer: TRICARE For Life (TFL) | Admitting: Hematology

## 2020-11-11 DIAGNOSIS — E559 Vitamin D deficiency, unspecified: Secondary | ICD-10-CM | POA: Diagnosis not present

## 2020-11-11 DIAGNOSIS — M5412 Radiculopathy, cervical region: Secondary | ICD-10-CM | POA: Diagnosis not present

## 2020-11-11 DIAGNOSIS — G4733 Obstructive sleep apnea (adult) (pediatric): Secondary | ICD-10-CM | POA: Diagnosis not present

## 2020-11-11 DIAGNOSIS — Z79899 Other long term (current) drug therapy: Secondary | ICD-10-CM | POA: Diagnosis not present

## 2020-11-11 DIAGNOSIS — I10 Essential (primary) hypertension: Secondary | ICD-10-CM | POA: Diagnosis not present

## 2020-11-11 DIAGNOSIS — N401 Enlarged prostate with lower urinary tract symptoms: Secondary | ICD-10-CM | POA: Diagnosis not present

## 2020-11-11 DIAGNOSIS — D696 Thrombocytopenia, unspecified: Secondary | ICD-10-CM | POA: Diagnosis not present

## 2020-11-11 DIAGNOSIS — E782 Mixed hyperlipidemia: Secondary | ICD-10-CM | POA: Diagnosis not present

## 2020-11-11 DIAGNOSIS — R399 Unspecified symptoms and signs involving the genitourinary system: Secondary | ICD-10-CM | POA: Diagnosis not present

## 2020-11-20 ENCOUNTER — Encounter (INDEPENDENT_AMBULATORY_CARE_PROVIDER_SITE_OTHER): Payer: TRICARE For Life (TFL) | Admitting: Ophthalmology

## 2020-11-26 ENCOUNTER — Encounter (INDEPENDENT_AMBULATORY_CARE_PROVIDER_SITE_OTHER): Payer: TRICARE For Life (TFL) | Admitting: Ophthalmology

## 2020-12-02 ENCOUNTER — Encounter (INDEPENDENT_AMBULATORY_CARE_PROVIDER_SITE_OTHER): Payer: Medicare Other | Admitting: Ophthalmology

## 2021-01-30 DIAGNOSIS — R059 Cough, unspecified: Secondary | ICD-10-CM | POA: Diagnosis not present

## 2021-02-10 ENCOUNTER — Ambulatory Visit (INDEPENDENT_AMBULATORY_CARE_PROVIDER_SITE_OTHER): Payer: Medicare Other | Admitting: Ophthalmology

## 2021-02-10 ENCOUNTER — Encounter (INDEPENDENT_AMBULATORY_CARE_PROVIDER_SITE_OTHER): Payer: Self-pay | Admitting: Ophthalmology

## 2021-02-10 ENCOUNTER — Other Ambulatory Visit: Payer: Self-pay

## 2021-02-10 DIAGNOSIS — H33102 Unspecified retinoschisis, left eye: Secondary | ICD-10-CM

## 2021-02-10 DIAGNOSIS — H353112 Nonexudative age-related macular degeneration, right eye, intermediate dry stage: Secondary | ICD-10-CM

## 2021-02-10 DIAGNOSIS — H353122 Nonexudative age-related macular degeneration, left eye, intermediate dry stage: Secondary | ICD-10-CM | POA: Diagnosis not present

## 2021-02-10 NOTE — Progress Notes (Signed)
02/10/2021     CHIEF COMPLAINT Patient presents for Retina Follow Up (1 year fu OU and FP/Pt states, "I am seeing better than ever I feel like. I can see great at a distance without my glasses. "Vicente Males any FOL/floaters///)   HISTORY OF PRESENT ILLNESS: Scott Zamora is a 86 y.o. male who presents to the clinic today for:   HPI     Retina Follow Up           Diagnosis: Other   Laterality: both eyes   Onset: 1 year ago   Severity: mild   Duration: 1 year   Course: gradually improving   Comments: 1 year fu OU and FP Pt states, "I am seeing better than ever I feel like. I can see great at a distance without my glasses. " Denies any FOL/floaters          Last edited by Kendra Opitz, COA on 02/10/2021  9:32 AM.      Referring physician: Rutherford Guys, [place],  [place] [address]  HISTORICAL INFORMATION:   Selected notes from the [facility]: No current outpatient medications on file. (Ophthalmic Drugs)   No current facility-administered medications for this visit. (Ophthalmic Drugs)   Current Outpatient Medications (Other)  Medication Sig   acetaminophen (TYLENOL) 500 MG tablet Take 500 mg by mouth at bedtime. Reported on 09/26/2015   ammonium lactate (AMLACTIN) 12 % cream Apply topically as needed for dry skin.    ascorbic acid (VITAMIN C) 250 MG tablet Take 250 mg by mouth daily.   aspirin EC 81 MG tablet Take 81 mg by mouth daily.   Cholecalciferol (VITAMIN D-3) 1000 UNITS CAPS Take 1,000 Units by mouth daily.   COD LIVER OIL PO Take 1 tablet by mouth daily as needed (for supplementation). Reported on 09/26/2015   Coenzyme Q10 (COQ-10 PO) Take 1 tablet by mouth daily. Reported on 09/26/2015   diazepam (VALIUM) 2 MG tablet Take 1 tablet (2 mg total) by mouth every 6 (six) hours as needed for muscle spasms or sedation.   Digestive Enzymes (PAPAYA AND ENZYMES PO) Take 2 tablets by mouth daily as needed (for  digestive support). Reported on 09/26/2015   ferrous sulfate 325 (65 FE) MG tablet Take 325 mg by mouth daily.   flunisolide (NASALIDE) 25 MCG/ACT (0.025%) SOLN Inhale 2 sprays into the lungs daily as needed. Reported on 09/26/2015   fluticasone (VERAMYST) 27.5 MCG/SPRAY nasal spray Place 2 sprays into the nose daily.   hydrochlorothiazide (HYDRODIURIL) 25 MG tablet Take 12.5 mg by mouth daily.   loratadine (CLARITIN) 10 MG tablet Take 10 mg by mouth daily as needed. For allergies   omeprazole (PRILOSEC) 20 MG capsule Take 20 mg by mouth daily.   POTASSIUM PO Take 1 tablet by mouth daily. Reported on 09/26/2015   tamsulosin (FLOMAX) 0.4 MG CAPS capsule Take 0.4 mg by mouth daily after supper.   TURMERIC PO Take 300 mg by mouth daily. Reported on 09/26/2015   verapamil (CALAN-SR) 240 MG CR tablet Take 240 mg by mouth at bedtime.   vitamin B-12 (CYANOCOBALAMIN) 250 MCG tablet Take 250 mcg by mouth 2 (two) times a week. Reported on 09/26/2015   No current facility-administered medications for this visit. (Other)      REVIEW OF SYSTEMS:    ALLERGIES No Known Allergies  PAST MEDICAL HISTORY Past Medical History:  Diagnosis Date   Arthritis  Dry skin    GERD (gastroesophageal reflux disease)    Hypertension    Inguinal hernia, left    Seasonal allergies    Sleep apnea    has CPAP but does not wear it   Vertigo    hx of   Past Surgical History:  Procedure Laterality Date   CATARACT EXTRACTION W/PHACO Bilateral    Dr. Gershon Crane   COLONOSCOPY W/ POLYPECTOMY     EYE SURGERY Bilateral    HERNIA REPAIR     INGUINAL HERNIA REPAIR Left 04/18/2014   Procedure: HERNIA REPAIR INGUINAL ADULT;  Surgeon: Jamesetta So, MD;  Location: [facility];  Service: General;  Laterality: Left;   INSERTION OF MESH Left 04/18/2014   Procedure: INSERTION OF MESH;  Surgeon: Jamesetta So, MD;  Location: [facility];  Service: General;  Laterality: Left;   KNEE ARTHROSCOPY     left knee   TOTAL SHOULDER  ARTHROPLASTY  08/11/2012   Dr Justice Britain   TOTAL SHOULDER ARTHROPLASTY  08/11/2012   Procedure: TOTAL SHOULDER ARTHROPLASTY;  Surgeon: Marin Shutter, MD;  Location: [facility];  Service: Orthopedics;  Laterality: Left;   TOTAL SHOULDER ARTHROPLASTY Right 09/12/2015   Procedure: RIGHT TOTAL SHOULDER ARTHROPLASTY;  Surgeon: Justice Britain, MD;  Location: [place];  Service: Orthopedics;  Laterality: Right;    FAMILY HISTORY Family History  Problem Relation Age of Onset   Hypertension Mother    Diabetes Mother    Cancer Sister    Hypertension Brother    Diabetes Brother    Hypertension Sister     SOCIAL HISTORY Social History   Tobacco Use   Smoking status: Never   Smokeless tobacco: Never  Substance Use Topics   Alcohol use: No   Drug use: No         OPHTHALMIC EXAM:  Base Eye Exam     Visual Acuity (ETDRS)       Right Left   Dist cc 20/50 20/30   Dist ph cc 20/25 -2 NI         Tonometry (Tonopen, 9:35 AM)       Right Left   Pressure 17 17         Pupils       Pupils Dark Light Shape React APD   Right PERRL 3 3 Round Minimal None   Left PERRL 3 3 Round Minimal None         Visual Fields (Counting fingers)       Left Right    Full Full         Extraocular Movement       Right Left    Full Full         Neuro/Psych     Oriented x3: Yes   Mood/Affect: Normal         Dilation     Both eyes: 1.0% Mydriacyl, 2.5% Phenylephrine @ 9:35 AM           Slit Lamp and Fundus Exam     External Exam       Right Left   External Normal Normal         Slit Lamp Exam       Right Left   Lids/Lashes Normal Normal   Conjunctiva/Sclera White and quiet White and quiet   Cornea Clear Clear   Anterior Chamber Deep and quiet Deep and quiet   Iris Round and reactive Round and reactive   Lens Posterior chamber intraocular lens Posterior  chamber intraocular lens   Anterior Vitreous Normal Normal         Fundus Exam       Right Left    Posterior Vitreous Normal Normal   Disc Normal Normal   C/D Ratio 0.4 0.5   Macula Normal Normal   Vessels Normal Normal   Periphery Normal Peripheral retinoschisis,, extends from 1030 -130 superiorly, with no inner or outer retinal breaks, Schisis cavity,10 30-1 meridian, extends posterior to the equator, no progression            IMAGING AND PROCEDURES  Imaging and Procedures for 02/10/21  Color Fundus Photography Optos - OU - Both Eyes       Right Eye Progression has no prior data. Disc findings include normal observations. Macula : drusen. Vessels : normal observations. Periphery : normal observations.   Left Eye Progression has no prior data. Disc findings include normal observations. Macula : normal observations. Vessels : normal observations. Periphery : normal observations.   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  Age-related macular degeneration OD, intermediate type             ASSESSMENT/PLAN:  Intermediate stage nonexudative age-related macular degeneration of left eye The nature of age--related macular degeneration was discussed with the patient as well as the distinction between dry and wet types. Checking an Amsler Grid daily with advice to return immediately should a distortion develop, was given to the patient. The patient 's smoking status now and in the past was determined and advice based on the AREDS study was provided regarding the consumption of antioxidant supplements. AREDS 2 vitamin formulation was recommended. Consumption of dark leafy vegetables and fresh fruits of various colors was recommended. Treatment modalities for wet macular degeneration particularly the use of intravitreal injections of anti-blood vessel growth factors was discussed with the patient. Avastin, Lucentis, and Eylea are the available options. On occasion, therapy includes the use of photodynamic therapy and thermal laser. Stressed to the patient do not rub  eyes.  Patient was advised to check Amsler Grid daily and return immediately if changes are noted. Instructions on using the grid were given to the patient. All patient questions were answered.      ICD-10-CM   1. Intermediate stage nonexudative age-related macular degeneration of right eye  H35.3112     2. Left retinoschisis  H33.102 Color Fundus Photography Optos - OU - Both Eyes    3. Intermediate stage nonexudative age-related macular degeneration of left eye  H35.3122       1.  Bilateral mild ARMD, no signs of progression.  No signs of complication  2.  OS with peripheral retinoschisis of large bolus extent yet no progression no therapy required no interim breaks no outbreaks observe  3.  Ophthalmic Meds Ordered this visit:  No orders of the defined types were placed in this encounter.      Return in about 1 year (around 02/10/2022) for dilate, COLOR FP, OCT.  There are no Patient Instructions on file for this visit.   Explained the diagnoses, plan, and follow up with the patient and they expressed understanding.  Patient expressed understanding of the importance of proper follow up care.   Clent Demark Brantley Wiley M.D. Diseases & Surgery of the Retina and Vitreous Retina & Diabetic [place] 02/10/21     Abbreviations: M myopia (nearsighted); A astigmatism; H hyperopia (farsighted); P presbyopia; Mrx spectacle prescription;  CTL contact lenses; OD right eye; OS left eye; OU both eyes  XT exotropia; ET esotropia; PEK punctate epithelial keratitis; PEE punctate epithelial erosions; DES dry eye syndrome; MGD meibomian gland dysfunction; ATs artificial tears; PFAT's preservative free artificial tears; [place] nuclear sclerotic cataract; PSC posterior subcapsular cataract; ERM epi-retinal membrane; PVD posterior vitreous detachment; RD retinal detachment; DM diabetes mellitus; DR diabetic retinopathy; NPDR non-proliferative diabetic retinopathy; PDR proliferative diabetic retinopathy; CSME  clinically significant macular edema; DME diabetic macular edema; dbh dot blot hemorrhages; CWS cotton wool spot; POAG primary open angle glaucoma; C/D cup-to-disc ratio; HVF humphrey visual field; GVF goldmann visual field; OCT optical coherence tomography; IOP intraocular pressure; BRVO Branch retinal vein occlusion; CRVO central retinal vein occlusion; CRAO central retinal artery occlusion; BRAO branch retinal artery occlusion; RT retinal tear; SB scleral buckle; PPV pars plana vitrectomy; VH Vitreous hemorrhage; PRP panretinal laser photocoagulation; IVK intravitreal kenalog; VMT vitreomacular traction; MH Macular hole;  NVD neovascularization of the disc; NVE neovascularization elsewhere; AREDS age related eye disease study; ARMD age related macular degeneration; POAG primary open angle glaucoma; EBMD epithelial/anterior basement membrane dystrophy; ACIOL anterior chamber intraocular lens; IOL intraocular lens; PCIOL posterior chamber intraocular lens; Phaco/IOL phacoemulsification with intraocular lens placement; [place] photorefractive keratectomy; LASIK laser assisted in situ keratomileusis; HTN hypertension; DM diabetes mellitus; COPD chronic obstructive pulmonary disease

## 2021-02-10 NOTE — Assessment & Plan Note (Signed)

## 2021-05-15 DIAGNOSIS — E559 Vitamin D deficiency, unspecified: Secondary | ICD-10-CM | POA: Diagnosis not present

## 2021-05-15 DIAGNOSIS — N401 Enlarged prostate with lower urinary tract symptoms: Secondary | ICD-10-CM | POA: Diagnosis not present

## 2021-05-15 DIAGNOSIS — Z79899 Other long term (current) drug therapy: Secondary | ICD-10-CM | POA: Diagnosis not present

## 2021-05-15 DIAGNOSIS — I10 Essential (primary) hypertension: Secondary | ICD-10-CM | POA: Diagnosis not present

## 2021-05-15 DIAGNOSIS — G4733 Obstructive sleep apnea (adult) (pediatric): Secondary | ICD-10-CM | POA: Diagnosis not present

## 2021-05-15 DIAGNOSIS — R399 Unspecified symptoms and signs involving the genitourinary system: Secondary | ICD-10-CM | POA: Diagnosis not present

## 2021-05-15 DIAGNOSIS — M5412 Radiculopathy, cervical region: Secondary | ICD-10-CM | POA: Diagnosis not present

## 2021-05-15 DIAGNOSIS — D696 Thrombocytopenia, unspecified: Secondary | ICD-10-CM | POA: Diagnosis not present

## 2021-05-15 DIAGNOSIS — E782 Mixed hyperlipidemia: Secondary | ICD-10-CM | POA: Diagnosis not present

## 2021-05-22 DIAGNOSIS — I7121 Aneurysm of the ascending aorta, without rupture: Secondary | ICD-10-CM | POA: Diagnosis not present

## 2021-05-22 DIAGNOSIS — I10 Essential (primary) hypertension: Secondary | ICD-10-CM | POA: Diagnosis not present

## 2022-02-10 ENCOUNTER — Ambulatory Visit (INDEPENDENT_AMBULATORY_CARE_PROVIDER_SITE_OTHER): Payer: Medicare PPO | Admitting: Ophthalmology

## 2022-02-10 ENCOUNTER — Encounter (INDEPENDENT_AMBULATORY_CARE_PROVIDER_SITE_OTHER): Payer: Self-pay | Admitting: Ophthalmology

## 2022-02-10 DIAGNOSIS — H33102 Unspecified retinoschisis, left eye: Secondary | ICD-10-CM | POA: Diagnosis not present

## 2022-02-10 DIAGNOSIS — H353112 Nonexudative age-related macular degeneration, right eye, intermediate dry stage: Secondary | ICD-10-CM

## 2022-02-10 DIAGNOSIS — H353132 Nonexudative age-related macular degeneration, bilateral, intermediate dry stage: Secondary | ICD-10-CM

## 2022-02-10 DIAGNOSIS — H353122 Nonexudative age-related macular degeneration, left eye, intermediate dry stage: Secondary | ICD-10-CM | POA: Diagnosis not present

## 2022-02-10 NOTE — Assessment & Plan Note (Signed)
Bullous retinoschisis superotemporal quadrant, no inner or outer holes.  Stable over time.  Continue observe

## 2022-02-10 NOTE — Assessment & Plan Note (Signed)
Intermediate OD no sign of CNVM

## 2022-02-10 NOTE — Assessment & Plan Note (Signed)
Intermediate OS, no sign of CNVM

## 2022-02-10 NOTE — Progress Notes (Signed)
02/10/2022     CHIEF COMPLAINT Patient presents for  Chief Complaint  Patient presents with   Macular Degeneration   Retina Evaluation      HISTORY OF PRESENT ILLNESS: Scott E Vesely Sr. is a 87 y.o. male who presents to the clinic today for:   HPI     Retina Evaluation           Laterality: left eye         Comments   1 year fu ou oct fp history of bullous retinoschisis left eye as well as intermediate AMD OU Pt states his vision has not been stable  Pt admits to a wave in his left eye that is really bothering him  Pt states he had an eye infection in early June and that's when he first noticed the wave in his left eye       Last edited by Hurman Horn, MD on 02/10/2022 10:15 AM.      Referring physician: No referring provider defined for this encounter.  HISTORICAL INFORMATION:   Selected notes from the MEDICAL RECORD NUMBER       CURRENT MEDICATIONS: No current outpatient medications on file. (Ophthalmic Drugs)   No current facility-administered medications for this visit. (Ophthalmic Drugs)   Current Outpatient Medications (Other)  Medication Sig   acetaminophen (TYLENOL) 500 MG tablet Take 500 mg by mouth at bedtime. Reported on 09/26/2015   ammonium lactate (AMLACTIN) 12 % cream Apply topically as needed for dry skin.    ascorbic acid (VITAMIN C) 250 MG tablet Take 250 mg by mouth daily.   aspirin EC 81 MG tablet Take 81 mg by mouth daily.   Cholecalciferol (VITAMIN D-3) 1000 UNITS CAPS Take 1,000 Units by mouth daily.   COD LIVER OIL PO Take 1 tablet by mouth daily as needed (for supplementation). Reported on 09/26/2015   Coenzyme Q10 (COQ-10 PO) Take 1 tablet by mouth daily. Reported on 09/26/2015   diazepam (VALIUM) 2 MG tablet Take 1 tablet (2 mg total) by mouth every 6 (six) hours as needed for muscle spasms or sedation.   Digestive Enzymes (PAPAYA AND ENZYMES PO) Take 2 tablets by mouth daily as needed (for digestive support). Reported on  09/26/2015   ferrous sulfate 325 (65 FE) MG tablet Take 325 mg by mouth daily.   flunisolide (NASALIDE) 25 MCG/ACT (0.025%) SOLN Inhale 2 sprays into the lungs daily as needed. Reported on 09/26/2015   fluticasone (VERAMYST) 27.5 MCG/SPRAY nasal spray Place 2 sprays into the nose daily.   hydrochlorothiazide (HYDRODIURIL) 25 MG tablet Take 12.5 mg by mouth daily.   loratadine (CLARITIN) 10 MG tablet Take 10 mg by mouth daily as needed. For allergies   omeprazole (PRILOSEC) 20 MG capsule Take 20 mg by mouth daily.   POTASSIUM PO Take 1 tablet by mouth daily. Reported on 09/26/2015   tamsulosin (FLOMAX) 0.4 MG CAPS capsule Take 0.4 mg by mouth daily after supper.   TURMERIC PO Take 300 mg by mouth daily. Reported on 09/26/2015   verapamil (CALAN-SR) 240 MG CR tablet Take 240 mg by mouth at bedtime.   vitamin B-12 (CYANOCOBALAMIN) 250 MCG tablet Take 250 mcg by mouth 2 (two) times a week. Reported on 09/26/2015   No current facility-administered medications for this visit. (Other)      REVIEW OF SYSTEMS: ROS   Negative for: Constitutional, Gastrointestinal, Neurological, Skin, Genitourinary, Musculoskeletal, HENT, Endocrine, Cardiovascular, Eyes, Respiratory, Psychiatric, Allergic/Imm, Heme/Lymph Last edited by Hurman Horn,  MD on 02/10/2022 10:11 AM.       ALLERGIES No Known Allergies  PAST MEDICAL HISTORY Past Medical History:  Diagnosis Date   Arthritis    Dry skin    GERD (gastroesophageal reflux disease)    Hypertension    Inguinal hernia, left    Seasonal allergies    Sleep apnea    has CPAP but does not wear it   Vertigo    hx of   Past Surgical History:  Procedure Laterality Date   CATARACT EXTRACTION W/PHACO Bilateral    Dr. Gershon Crane   COLONOSCOPY W/ POLYPECTOMY     EYE SURGERY Bilateral    HERNIA REPAIR     INGUINAL HERNIA REPAIR Left 04/18/2014   Procedure: HERNIA REPAIR INGUINAL ADULT;  Surgeon: Jamesetta So, MD;  Location: [facility];  Service: General;   Laterality: Left;   INSERTION OF MESH Left 04/18/2014   Procedure: INSERTION OF MESH;  Surgeon: Jamesetta So, MD;  Location: [facility];  Service: General;  Laterality: Left;   KNEE ARTHROSCOPY     left knee   TOTAL SHOULDER ARTHROPLASTY  08/11/2012   Dr Justice Britain   TOTAL SHOULDER ARTHROPLASTY  08/11/2012   Procedure: TOTAL SHOULDER ARTHROPLASTY;  Surgeon: Marin Shutter, MD;  Location: [place];  Service: Orthopedics;  Laterality: Left;   TOTAL SHOULDER ARTHROPLASTY Right 09/12/2015   Procedure: RIGHT TOTAL SHOULDER ARTHROPLASTY;  Surgeon: Justice Britain, MD;  Location: [place];  Service: Orthopedics;  Laterality: Right;    FAMILY HISTORY Family History  Problem Relation Age of Onset   Hypertension Mother    Diabetes Mother    Cancer Sister    Hypertension Brother    Diabetes Brother    Hypertension Sister     SOCIAL HISTORY Social History   Tobacco Use   Smoking status: Never   Smokeless tobacco: Never  Substance Use Topics   Alcohol use: No   Drug use: No         OPHTHALMIC EXAM:  Base Eye Exam     Visual Acuity (ETDRS)       Right Left   Dist [place] 20/25 20/25 +3         Tonometry (Tonopen, 9:17 AM)       Right Left   Pressure 14 11         Pupils       Pupils APD   Right PERRL None   Left PERRL None         Visual Fields       Left Right     Full         Extraocular Movement       Right Left    Full, Ortho Full, Ortho         Neuro/Psych     Oriented x3: Yes   Mood/Affect: Normal         Dilation     Both eyes: 1.0% Mydriacyl, 2.5% Phenylephrine @ 9:12 AM           Slit Lamp and Fundus Exam     External Exam       Right Left   External Normal Normal         Slit Lamp Exam       Right Left   Lids/Lashes Normal Normal   Conjunctiva/Sclera White and quiet White and quiet   Cornea Clear Clear   Anterior Chamber Deep and quiet Deep and quiet   Iris Round and  reactive Round and reactive   Lens Posterior chamber  intraocular lens Posterior chamber intraocular lens   Anterior Vitreous Normal Normal         Fundus Exam       Right Left   Posterior Vitreous Normal Normal   Disc Normal Normal   C/D Ratio 0.4 0.5   Macula Normal Normal   Vessels Normal Normal   Periphery Normal Peripheral retinoschisis,, extends from 1030 -130 superiorly, with no inner or outer retinal breaks, Schisis cavity,10 30-1 meridian, extends posterior to the equator, no progression            IMAGING AND PROCEDURES  Imaging and Procedures for 02/10/22  OCT, Retina - OU - Both Eyes       Right Eye Quality was good. Scan locations included subfoveal. Central Foveal Thickness: 300. Findings include abnormal foveal contour, retinal drusen .   Left Eye Quality was good. Scan locations included subfoveal. Central Foveal Thickness: 306. Findings include abnormal foveal contour, retinal drusen .   Notes With dry age-related macular degeneration OU     Color Fundus Photography Optos - OU - Both Eyes       Right Eye Progression has no prior data. Disc findings include normal observations. Macula : drusen. Vessels : normal observations. Periphery : normal observations.   Left Eye Progression has no prior data. Disc findings include normal observations. Macula : drusen. Vessels : normal observations.   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  Stable over time.  Continue observe     ICD-10-CM   1. Intermediate stage nonexudative age-related macular degeneration of right eye  H35.3112  OCT, Retina - OU - Both Eyes    Color Fundus Photography Optos - OU - Both Eyes    2. 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 Left retinoschisis  H33.102       1.  2.  3.  Ophthalmic Meds Ordered this visit:  No orders of the defined types were placed in this encounter.      Return in about 1 year (around 02/11/2023) for DILATE OU, COLOR FP.  There are no Patient Instructions on file for this visit.   Explained the diagnoses, plan, and follow up with the patient and they expressed understanding.  Patient expressed understanding of the importance of proper follow up care.   Clent Demark Coretha Creswell M.D. Diseases & Surgery of the Retina and Vitreous [facility] [place] 02/10/22     Abbreviations: M myopia (nearsighted); A astigmatism; H hyperopia (farsighted); P presbyopia; Mrx spectacle prescription;  CTL contact lenses; OD right eye; OS left eye; OU both eyes  XT exotropia; ET esotropia; PEK punctate epithelial keratitis; PEE punctate epithelial erosions; DES dry eye syndrome; MGD meibomian gland dysfunction; ATs artificial tears; PFAT's preservative free artificial tears; [place] nuclear sclerotic cataract; PSC posterior subcapsular cataract; ERM epi-retinal membrane; PVD posterior vitreous detachment; RD retinal detachment; DM diabetes mellitus; DR diabetic retinopathy; NPDR non-proliferative diabetic retinopathy; PDR proliferative diabetic retinopathy; CSME clinically significant macular edema; DME diabetic macular edema; dbh dot blot hemorrhages;  CWS cotton wool spot; POAG primary open angle glaucoma; C/D cup-to-disc ratio; HVF humphrey visual field; GVF goldmann visual field; OCT optical coherence tomography; IOP intraocular pressure; BRVO Branch retinal vein occlusion; CRVO central retinal vein occlusion; CRAO central retinal artery occlusion; BRAO branch retinal artery occlusion; RT retinal tear; SB scleral buckle; PPV pars  plana vitrectomy; VH Vitreous hemorrhage; PRP panretinal laser photocoagulation; IVK intravitreal kenalog; VMT vitreomacular traction; MH Macular hole;  NVD neovascularization of the disc; NVE neovascularization elsewhere; AREDS age related eye disease study; ARMD age related macular degeneration; POAG primary open angle glaucoma; EBMD epithelial/anterior basement membrane dystrophy; ACIOL anterior chamber intraocular lens; IOL intraocular lens; PCIOL posterior chamber intraocular lens; Phaco/IOL phacoemulsification with intraocular lens placement; [place] photorefractive keratectomy; LASIK laser assisted in situ keratomileusis; HTN hypertension; DM diabetes mellitus; COPD chronic obstructive pulmonary disease

## 2022-02-23 ENCOUNTER — Encounter (HOSPITAL_COMMUNITY): Payer: Self-pay | Admitting: *Deleted

## 2022-02-23 ENCOUNTER — Emergency Department (HOSPITAL_COMMUNITY): Payer: Medicare PPO

## 2022-02-23 ENCOUNTER — Observation Stay (HOSPITAL_COMMUNITY)
Admission: EM | Admit: 2022-02-23 | Discharge: 2022-02-24 | Disposition: A | Discharge status: Home / Self Care | Payer: Medicare PPO | Attending: Internal Medicine | Admitting: Internal Medicine

## 2022-02-23 ENCOUNTER — Other Ambulatory Visit: Payer: Self-pay

## 2022-02-23 DIAGNOSIS — R778 Other specified abnormalities of plasma proteins: Secondary | ICD-10-CM | POA: Diagnosis not present

## 2022-02-23 DIAGNOSIS — I10 Essential (primary) hypertension: Secondary | ICD-10-CM | POA: Diagnosis not present

## 2022-02-23 DIAGNOSIS — R000 Tachycardia, unspecified: Secondary | ICD-10-CM | POA: Insufficient documentation

## 2022-02-23 DIAGNOSIS — Z79899 Other long term (current) drug therapy: Secondary | ICD-10-CM | POA: Insufficient documentation

## 2022-02-23 DIAGNOSIS — R531 Weakness: Secondary | ICD-10-CM | POA: Diagnosis present

## 2022-02-23 DIAGNOSIS — Z96612 Presence of left artificial shoulder joint: Secondary | ICD-10-CM | POA: Insufficient documentation

## 2022-02-23 DIAGNOSIS — Z96611 Presence of right artificial shoulder joint: Secondary | ICD-10-CM | POA: Insufficient documentation

## 2022-02-23 DIAGNOSIS — I471 Supraventricular tachycardia: Secondary | ICD-10-CM | POA: Diagnosis not present

## 2022-02-23 DIAGNOSIS — Z7982 Long term (current) use of aspirin: Secondary | ICD-10-CM | POA: Diagnosis not present

## 2022-02-23 HISTORY — DX: Radiculopathy, cervical region: M54.12

## 2022-02-23 HISTORY — DX: Tinea unguium: B35.1

## 2022-02-23 HISTORY — DX: Aortic aneurysm of unspecified site, without rupture: I71.9

## 2022-02-23 HISTORY — DX: Thrombocytopenia, unspecified: D69.6

## 2022-02-23 HISTORY — DX: Benign prostatic hyperplasia without lower urinary tract symptoms: N40.0

## 2022-02-23 HISTORY — DX: Mixed hyperlipidemia: E78.2

## 2022-02-23 HISTORY — DX: Vitamin D deficiency, unspecified: E55.9

## 2022-02-23 LAB — CBC WITH DIFFERENTIAL/PLATELET
Abs Immature Granulocytes: 0.03 10*3/uL (ref 0.00–0.07)
Basophils Absolute: 0 10*3/uL (ref 0.0–0.1)
Basophils Relative: 0 %
Eosinophils Absolute: 0 10*3/uL (ref 0.0–0.5)
Eosinophils Relative: 0 %
HCT: 38.9 % — ABNORMAL LOW (ref 39.0–52.0)
Hemoglobin: 12.8 g/dL — ABNORMAL LOW (ref 13.0–17.0)
Immature Granulocytes: 0 %
Lymphocytes Relative: 19 %
Lymphs Abs: 1.3 10*3/uL (ref 0.7–4.0)
MCH: 30 pg (ref 26.0–34.0)
MCHC: 32.9 g/dL (ref 30.0–36.0)
MCV: 91.3 fL (ref 80.0–100.0)
Monocytes Absolute: 0.5 10*3/uL (ref 0.1–1.0)
Monocytes Relative: 7 %
Neutro Abs: 5.1 10*3/uL (ref 1.7–7.7)
Neutrophils Relative %: 74 %
Platelets: 165 10*3/uL (ref 150–400)
RBC: 4.26 MIL/uL (ref 4.22–5.81)
RDW: 14.7 % (ref 11.5–15.5)
WBC: 7 10*3/uL (ref 4.0–10.5)
nRBC: 0 % (ref 0.0–0.2)

## 2022-02-23 LAB — URINALYSIS, ROUTINE W REFLEX MICROSCOPIC
Bilirubin Urine: NEGATIVE
Glucose, UA: NEGATIVE mg/dL
Hgb urine dipstick: NEGATIVE
Ketones, ur: NEGATIVE mg/dL
Leukocytes,Ua: NEGATIVE
Nitrite: NEGATIVE
Protein, ur: NEGATIVE mg/dL
Specific Gravity, Urine: 1.005 (ref 1.005–1.030)
pH: 7 (ref 5.0–8.0)

## 2022-02-23 LAB — COMPREHENSIVE METABOLIC PANEL
ALT: 31 U/L (ref 0–44)
AST: 24 U/L (ref 15–41)
Albumin: 3.8 g/dL (ref 3.5–5.0)
Alkaline Phosphatase: 58 U/L (ref 38–126)
Anion gap: 5 (ref 5–15)
BUN: 18 mg/dL (ref 8–23)
CO2: 30 mmol/L (ref 22–32)
Calcium: 9 mg/dL (ref 8.9–10.3)
Chloride: 106 mmol/L (ref 98–111)
Creatinine, Ser: 0.8 mg/dL (ref 0.61–1.24)
GFR, Estimated: >60 mL/min (ref >60)
Glucose, Bld: 95 mg/dL (ref 70–99)
Potassium: 3.7 mmol/L (ref 3.5–5.1)
Sodium: 141 mmol/L (ref 135–145)
Total Bilirubin: 0.3 mg/dL (ref 0.3–1.2)
Total Protein: 6.6 g/dL (ref 6.5–8.1)

## 2022-02-23 LAB — TROPONIN I (HIGH SENSITIVITY)
Troponin I (High Sensitivity): 62 ng/L — ABNORMAL HIGH (ref <18)
Troponin I (High Sensitivity): 103 ng/L (ref <18)
Troponin I (High Sensitivity): 94 ng/L — ABNORMAL HIGH (ref <18)
Troponin I (High Sensitivity): 96 ng/L — ABNORMAL HIGH (ref <18)

## 2022-02-23 MED ORDER — ACETAMINOPHEN 325 MG PO TABS
650.0000 mg | ORAL_TABLET | ORAL | Status: DC | PRN
Start: 2022-02-23 — End: 2022-02-24

## 2022-02-23 MED ORDER — ENOXAPARIN SODIUM 40 MG/0.4ML IJ SOSY
40.0000 mg | PREFILLED_SYRINGE | INTRAMUSCULAR | Status: DC
  Filled 2022-02-23: qty 0.4

## 2022-02-23 MED ORDER — ONDANSETRON HCL 4 MG/2ML IJ SOLN: 4.0000 mg | Freq: Four times a day (QID) | INTRAMUSCULAR | Status: DC | PRN

## 2022-02-23 MED ORDER — VERAPAMIL HCL ER 240 MG PO TBCR
240.0000 mg | EXTENDED_RELEASE_TABLET | Freq: Every day | ORAL | Status: DC
  Administered 2022-02-23: 240 mg via ORAL
  Filled 2022-02-23 (×3): qty 1

## 2022-02-23 NOTE — Discharge Instructions (Addendum)
Follow-up with Dr. Harl Bowie or one of his colleagues in the next couple weeks.  Return if any problem

## 2022-02-23 NOTE — ED Notes (Signed)
Provider at bedside

## 2022-02-23 NOTE — Assessment & Plan Note (Addendum)
Likely demand ischemia secondary to the tachycardia with rate of 140 he had while in PCP office. Narrow complex tachycardia, looks like A.Flutter 2:1 with rate 140 to me based on the EMS strip. Pt admits missing Calan today. 1. CP obs pathway (though pt never had any CP with this) 2. Tele monitor 3. Trend trops 4. 2d echo 5. Med rec pending 1. Continue calan if pt still on this med 6. Saw EKG: looks like A.Flutter 2:1 on EMS EKG. 1. Rapid rate almost certainly not helped by missed calan today per patient. 2. Giving Calan dose now 3. Will defer decision on starting [facility] on this patient to day team / PCP / cards if day team decides to get them involved.

## 2022-02-23 NOTE — ED Triage Notes (Addendum)
Pt brought in by [facility] from PCP office in [facility]. He originally went to see the doctor for a rash on his foot but while there he c/o weakness. They decided to do a EKG and found pt in tachycardia with a HR of 150 and BP 88/62 per EMS. EMS reported he self converted as soon as he got on the ambulance. HR 94-100 en route to the hospital. EMS reports he has denied CP and SOB.

## 2022-02-23 NOTE — ED Provider Notes (Signed)
Care of patient assumed from Dr. Roderic Palau at 3:30 PM.  This patient presents following a brief episode of SVT prior to arrival.  This resolved on its own.  He has been asymptomatic during his stay in the ED.  His initial troponin was mildly elevated.  He is currently awaiting a delta troponin.  Cardiologist has been spoken to.  Plan is for the following: If troponin is uptrending, admit to hospitalist for observation and cardiology will follow.  If curve is flat, patient to be discharged with outpatient follow-up. Physical Exam  BP (!) 141/92   Pulse 77   Temp 97.6 F (36.4 C) (Oral)   Resp (!) 22   Ht 6' (1.829 m)   Wt 74.8 kg   SpO2 98%   BMI 22.38 kg/m   Physical Exam Vitals and nursing note reviewed.  Constitutional:      General: He is not in acute distress.    Appearance: Normal appearance. He is well-developed and normal weight. He is not ill-appearing, toxic-appearing or diaphoretic.  HENT:     Head: Normocephalic and atraumatic.     Right Ear: External ear normal.     Left Ear: External ear normal.     Nose: Nose normal.     Mouth/Throat:     Mouth: Mucous membranes are moist.     Pharynx: Oropharynx is clear.  Eyes:     Extraocular Movements: Extraocular movements intact.     Conjunctiva/sclera: Conjunctivae normal.  Cardiovascular:     Rate and Rhythm: Normal rate and regular rhythm.     Heart sounds: No murmur heard. Pulmonary:     Effort: Pulmonary effort is normal. No respiratory distress.  Abdominal:     General: There is no distension.     Palpations: Abdomen is soft.     Tenderness: There is no abdominal tenderness.  Musculoskeletal:        General: No swelling. Normal range of motion.     Cervical back: Normal range of motion and neck supple.     Right lower leg: No edema.     Left lower leg: No edema.  Skin:    General: Skin is warm and dry.     Coloration: Skin is not jaundiced or pale.  Neurological:     General: No focal deficit present.     Mental  Status: He is alert and oriented to person, place, and time.  Psychiatric:        Mood and Affect: Mood normal.        Behavior: Behavior normal.     Procedures  Procedures  ED Course / MDM    Medical Decision Making Amount and/or Complexity of Data Reviewed Labs: ordered. Radiology: ordered. ECG/medicine tests: ordered.  Risk Decision regarding hospitalization.   Patient's second troponin came back further elevated.  On assessment, patient resting comfortably.  He continues to deny any symptoms while in the ED.  He was informed of his mild increase in troponin and was agreeable to admission for observation and cardiology consult tomorrow.  He was admitted to hospitalist.       Godfrey Pick, MD 02/24/22 [phone]

## 2022-02-23 NOTE — ED Provider Notes (Signed)
[facility] EMERGENCY DEPARTMENT Provider Note   CSN: [id number] Arrival date & time: 02/23/22  1202     History {Add pertinent medical, surgical, social history, OB history to HPI:1} No chief complaint on file.   Scott Zamora. is a 87 y.o. male.  Patient states that he felt weak today for about 30 minutes at home.  Then he went to his doctor for an appointment and had another episode.  His doctor noticed that he was in a rapid heart rate and sent him to the emergency department.  Patient has a history of hypertension  The history is provided by the patient and medical records. No language interpreter was used.  Weakness Severity:  Moderate Onset quality:  Sudden Timing:  Intermittent Progression:  Improving Chronicity:  New Context: not alcohol use   Relieved by:  Nothing Worsened by:  Nothing Ineffective treatments:  None tried Associated symptoms: no abdominal pain, no chest pain, no cough, no diarrhea, no frequency, no headaches and no seizures        Home Medications Prior to Admission medications   Medication Sig Start Date End Date Taking? Authorizing Provider  acetaminophen (TYLENOL) 500 MG tablet Take 500 mg by mouth at bedtime. Reported on 09/26/2015    [provider]  ammonium lactate (AMLACTIN) 12 % cream Apply topically as needed for dry skin.     [provider]  ascorbic acid (VITAMIN C) 250 MG tablet Take 250 mg by mouth daily.    [provider]  aspirin EC 81 MG tablet Take 81 mg by mouth daily.    [provider]  Cholecalciferol (VITAMIN D-3) 1000 UNITS CAPS Take 1,000 Units by mouth daily.    [provider]  COD LIVER OIL PO Take 1 tablet by mouth daily as needed (for supplementation). Reported on 09/26/2015    [provider]  Coenzyme Q10 (COQ-10 PO) Take 1 tablet by mouth daily. Reported on 09/26/2015    [provider]  diazepam (VALIUM) 2 MG tablet Take 1 tablet (2 mg total) by  mouth every 6 (six) hours as needed for muscle spasms or sedation. 09/13/15   Shuford, Olivia Mackie, PA-C  Digestive Enzymes (PAPAYA AND ENZYMES PO) Take 2 tablets by mouth daily as needed (for digestive support). Reported on 09/26/2015    [provider]  ferrous sulfate 325 (65 FE) MG tablet Take 325 mg by mouth daily.    [provider]  flunisolide (NASALIDE) 25 MCG/ACT (0.025%) SOLN Inhale 2 sprays into the lungs daily as needed. Reported on 09/26/2015    [provider]  fluticasone (VERAMYST) 27.5 MCG/SPRAY nasal spray Place 2 sprays into the nose daily.    [provider]  hydrochlorothiazide (HYDRODIURIL) 25 MG tablet Take 12.5 mg by mouth daily.    [provider]  loratadine (CLARITIN) 10 MG tablet Take 10 mg by mouth daily as needed. For allergies    [provider]  omeprazole (PRILOSEC) 20 MG capsule Take 20 mg by mouth daily.    [provider]  POTASSIUM PO Take 1 tablet by mouth daily. Reported on 09/26/2015    [provider]  tamsulosin (FLOMAX) 0.4 MG CAPS capsule Take 0.4 mg by mouth daily after supper.    [provider]  TURMERIC PO Take 300 mg by mouth daily. Reported on 09/26/2015    [provider]  verapamil (CALAN-SR) 240 MG CR tablet Take 240 mg by mouth at bedtime.    [provider]  vitamin B-12 (CYANOCOBALAMIN) 250 MCG tablet Take 250 mcg by mouth 2 (two) times a week. Reported on 09/26/2015    [provider]      Allergies    Patient has no known allergies.    Review of Systems   Review of Systems  Constitutional:  Negative for appetite change and fatigue.  HENT:  Negative for congestion, ear discharge and sinus pressure.   Eyes:  Negative for discharge.  Respiratory:  Negative for cough.   Cardiovascular:  Negative for chest pain.  Gastrointestinal:  Negative for abdominal pain and diarrhea.  Genitourinary:  Negative for frequency and hematuria.   Musculoskeletal:  Negative for back pain.  Skin:  Negative for rash.  Neurological:  Positive for weakness. Negative for seizures and headaches.  Psychiatric/Behavioral:  Negative for hallucinations.     Physical Exam Updated Vital Signs BP (!) 164/93   Pulse 79   Temp 97.6 F (36.4 C) (Oral)   Resp (!) 22   Ht 6' (1.829 m)   Wt 74.8 kg   SpO2 100%   BMI 22.38 kg/m  Physical Exam Vitals and nursing note reviewed.  Constitutional:      Appearance: He is well-developed.  HENT:     Head: Normocephalic.     Nose: Nose normal.  Eyes:     General: No scleral icterus.    Conjunctiva/sclera: Conjunctivae normal.  Neck:     Thyroid: No thyromegaly.  Cardiovascular:     Rate and Rhythm: Normal rate and regular rhythm.     Heart sounds: No murmur heard.    No friction rub. No gallop.  Pulmonary:     Breath sounds: No stridor. No wheezing or rales.  Chest:     Chest wall: No tenderness.  Abdominal:     General: There is no distension.     Tenderness: There is no abdominal tenderness. There is no rebound.  Musculoskeletal:        General: Normal range of motion.     Cervical back: Neck supple.  Lymphadenopathy:     Cervical: No cervical adenopathy.  Skin:    Findings: No erythema or rash.  Neurological:     Mental Status: He is alert and oriented to person, place, and time.     Motor: No abnormal muscle tone.     Coordination: Coordination normal.  Psychiatric:        Behavior: Behavior normal.     ED Results / Procedures / Treatments   Labs (all labs ordered are listed, but only abnormal results are displayed) Labs Reviewed  CBC WITH DIFFERENTIAL/PLATELET - Abnormal; Notable for the following components:      Result Value   Hemoglobin 12.8 (*)    HCT 38.9 (*)    All other components within normal limits  TROPONIN I (HIGH SENSITIVITY) - Abnormal; Notable for the following components:   Troponin I (High Sensitivity) 62 (*)    All other components within normal  limits  URINALYSIS, ROUTINE W REFLEX MICROSCOPIC  COMPREHENSIVE METABOLIC PANEL  TROPONIN I (HIGH SENSITIVITY)    EKG None  Radiology DG Chest Port 1 View  Result Date: 02/23/2022 CLINICAL DATA:  Shortness of breath, weakness, tachycardia EXAM: PORTABLE CHEST 1 VIEW COMPARISON:  08/05/2012 FINDINGS: The heart size and mediastinal contours are within normal limits. Both lungs are clear. Status post bilateral shoulder total arthroplasty. IMPRESSION: No acute abnormality of the lungs in [facility] portable projection. Electronically Signed   By: Jamse Mead.D.  On: 02/23/2022 13:52    Procedures Procedures  {Document cardiac monitor, telemetry assessment procedure when appropriate:1}  Medications Ordered in ED Medications - No data to display  ED Course/ Medical Decision Making/ A&P  Patient with palpitations and SVT up around 140.  I spoke with cardiology and he can follow-up as an outpatient as long as a second troponin is flat.                         Medical Decision Making Amount and/or Complexity of Data Reviewed Labs: ordered. Radiology: ordered. ECG/medicine tests: ordered.   SVT with weakness.  Patient having second troponin pending and will be dispositioned to home if it is flat  {Document critical care time when appropriate:1} {Document review of labs and clinical decision tools ie heart score, Chads2Vasc2 etc:1}  {Document your independent review of radiology images, and any outside records:1} {Document your discussion with family members, caretakers, and with consultants:1} {Document social determinants of health affecting pt's care:1} {Document your decision making why or why not admission, treatments were needed:1} Final Clinical Impression(s) / ED Diagnoses Final diagnoses:  None    Rx / DC Orders ED Discharge Orders     None

## 2022-02-23 NOTE — H&P (Addendum)
History and Physical    Patient: Scott Zamora QMV:[id number] DOB: 08/29/1934 DOA: 02/23/2022 DOS: the patient was seen and examined on 02/23/2022 PCP: Scott Schwalbe, MD  Patient coming from: Home  Chief Complaint: Tachycardia  HPI: Scott E Ard Sr. is a 87 y.o. male with medical history significant of HTN, BPH.  Pt brought in by [facility] from PCP office in [facility]. He originally went to see the doctor for a rash on his foot but while there he c/o weakness. They decided to do a EKG and found pt in tachycardia with a HR of 150 and BP 88/62 per EMS. EMS reported he self converted as soon as he got on the ambulance. HR 94-100 en route to the hospital. EMS reports he has denied CP and SOB.   In ED, has remained in NSR and asymptomatic.  SBP now 140s.  HR 100-105 s.tach.  Trops have elevated slightly while in ED.  Unclear still wether initial episode was S.Tach / SVT vs PAF which would be new diagnosis.    Review of Systems: As mentioned in the history of present illness. All other systems reviewed and are negative. Past Medical History:  Diagnosis Date   Aortic aneurysm (HCC)    Arthritis    BPH (benign prostatic hyperplasia)    Cervical radiculopathy    Dry skin    GERD (gastroesophageal reflux disease)    Hypertension    Inguinal hernia, left    Mixed hyperlipidemia    Onychomycosis    Seasonal allergies    Sleep apnea    has CPAP but does not wear it   Thrombocytopenia (HCC)    Vertigo    hx of   Vitamin D deficiency    Past Surgical History:  Procedure Laterality Date   CATARACT EXTRACTION W/PHACO Bilateral    Dr. Gershon Crane   COLONOSCOPY W/ POLYPECTOMY     EYE SURGERY Bilateral    [place] Left 04/18/2014   Procedure: HERNIA REPAIR INGUINAL ADULT;  Surgeon: Jamesetta So, MD;  Location: [facility];  Service: General;  Laterality: Left;   INSERTION OF MESH Left 04/18/2014   Procedure: INSERTION OF MESH;  Surgeon: Jamesetta So, MD;  Location: [facility];  Service: General;  Laterality: Left;   KNEE ARTHROSCOPY     left knee   TOTAL SHOULDER ARTHROPLASTY Left 08/11/2012   Dr Justice Britain   TOTAL SHOULDER ARTHROPLASTY  08/11/2012   Procedure: TOTAL SHOULDER ARTHROPLASTY;  Surgeon: Marin Shutter, MD;  Location: [place];  Service: Orthopedics;  Laterality: Left;   TOTAL SHOULDER ARTHROPLASTY Right 09/12/2015   Procedure: RIGHT TOTAL SHOULDER ARTHROPLASTY;  Surgeon: Justice Britain, MD;  Location: [place];  Service: Orthopedics;  Laterality: Right;   Social History:  reports that he has never smoked. He has never used smokeless tobacco. He reports that he does not drink alcohol and does not use drugs.  No Known Allergies  Family History  Problem Relation Age of Onset   Hypertension Mother    Diabetes Mother    Cancer Sister    Hypertension Brother    Diabetes Brother    Hypertension Sister     Prior to Admission medications   Medication Sig Start Date End Date Taking? Authorizing Provider  acetaminophen (TYLENOL) 500 MG tablet Take 500 mg by mouth at bedtime. Reported on 09/26/2015    [provider]  ammonium lactate (AMLACTIN) 12 % cream Apply topically as needed  for dry skin.     [provider]  ascorbic acid (VITAMIN C) 250 MG tablet Take 250 mg by mouth daily.    [provider]  aspirin EC 81 MG tablet Take 81 mg by mouth daily.    [provider]  Cholecalciferol (VITAMIN D-3) 1000 UNITS CAPS Take 1,000 Units by mouth daily.    [provider]  COD LIVER OIL PO Take 1 tablet by mouth daily as needed (for supplementation). Reported on 09/26/2015    [provider]  Coenzyme Q10 (COQ-10 PO) Take 1 tablet by mouth daily. Reported on 09/26/2015    [provider]  diazepam (VALIUM) 2 MG tablet Take 1 tablet (2 mg total) by mouth every 6 (six) hours as needed for muscle spasms or sedation. 09/13/15   Shuford, Olivia Mackie, PA-C  Digestive Enzymes (PAPAYA AND  ENZYMES PO) Take 2 tablets by mouth daily as needed (for digestive support). Reported on 09/26/2015    [provider]  ferrous sulfate 325 (65 FE) MG tablet Take 325 mg by mouth daily.    [provider]  flunisolide (NASALIDE) 25 MCG/ACT (0.025%) SOLN Inhale 2 sprays into the lungs daily as needed. Reported on 09/26/2015    [provider]  fluticasone (VERAMYST) 27.5 MCG/SPRAY nasal spray Place 2 sprays into the nose daily.    [provider]  hydrochlorothiazide (HYDRODIURIL) 25 MG tablet Take 12.5 mg by mouth daily.    [provider]  loratadine (CLARITIN) 10 MG tablet Take 10 mg by mouth daily as needed. For allergies    [provider]  omeprazole (PRILOSEC) 20 MG capsule Take 20 mg by mouth daily.    [provider]  POTASSIUM PO Take 1 tablet by mouth daily. Reported on 09/26/2015    [provider]  tamsulosin (FLOMAX) 0.4 MG CAPS capsule Take 0.4 mg by mouth daily after supper.    [provider]  TURMERIC PO Take 300 mg by mouth daily. Reported on 09/26/2015    [provider]  verapamil (CALAN-SR) 240 MG CR tablet Take 240 mg by mouth at bedtime.    [provider]  vitamin B-12 (CYANOCOBALAMIN) 250 MCG tablet Take 250 mcg by mouth 2 (two) times a week. Reported on 09/26/2015    [provider]    Physical Exam: Vitals:   02/23/22 1730 02/23/22 1830 02/23/22 1930 02/23/22 1953  BP: (!) 152/85 (!) 155/94  (!) 168/101  Pulse: 67 74 70 87  Resp: 17 (!) '24 17 18  '$ Temp:      TempSrc:      SpO2: 99% 98% 99% 98%  Weight:      Height:       Constitutional: NAD, calm, comfortable Eyes: PERRL, lids and conjunctivae normal ENMT: Mucous membranes are moist. Posterior pharynx clear of any exudate or lesions.Normal dentition.  Neck: normal, supple, no masses, no thyromegaly Respiratory: clear to auscultation bilaterally, no wheezing, no crackles. Normal respiratory effort. No accessory  muscle use.  Cardiovascular: Regular rate and rhythm, no murmurs / rubs / gallops. No extremity edema. 2+ pedal pulses. No carotid bruits.  Abdomen: no tenderness, no masses palpated. No hepatosplenomegaly. Bowel sounds positive.  Musculoskeletal: no clubbing / cyanosis. No joint deformity upper and lower extremities. Good ROM, no contractures. Normal muscle tone.  Skin: Has what looks like fungal infection of both feet, pt states this has been improving some on topical antifungals but still persistent. Neurologic: CN 2-12 grossly intact. Sensation intact, DTR  normal. Strength 5/5 in all 4.  Psychiatric: Normal judgment and insight. Alert and oriented x 3. Normal mood.   Data Reviewed:    Platelets 165  Trops 62 and 103  EKG in ED: RBBB, though RBBB was present on prior EKG in 2017.  EKG with EMS: Looks like A.Flutter 2:1 with rate 140 to me.  Assessment and Plan: * Elevated troponin Likely demand ischemia secondary to the tachycardia with rate of 140 he had while in PCP office. Narrow complex tachycardia, looks like A.Flutter 2:1 with rate 140 to me based on the EMS strip. Pt admits missing Calan today. CP obs pathway (though pt never had any CP with this) Tele monitor Trend trops 2d echo Med rec pending Continue calan if pt still on this med Saw EKG: looks like A.Flutter 2:1 on EMS EKG. Rapid rate almost certainly not helped by missed calan today per patient. Giving Calan dose now Will defer decision on starting [facility] on this patient to day team / PCP / cards if day team decides to get them involved.  Essential hypertension Cont home BP meds when med rec completed.      Advance Care Planning:   Code Status: Full Code  Consults: None  Family Communication: Son at bedside, wife on phone.  Severity of Illness: The appropriate patient status for this patient is OBSERVATION. Observation status is judged to be reasonable and necessary in order to provide the required  intensity of service to ensure the patient's safety. The patient's presenting symptoms, physical exam findings, and initial radiographic and laboratory data in the context of their medical condition is felt to place them at decreased risk for further clinical deterioration. Furthermore, it is anticipated that the patient will be medically stable for discharge from the hospital within 2 midnights of admission.   Author: Etta Quill., DO 02/23/2022 8:08 PM  For on call review [email or web address].

## 2022-02-23 NOTE — Assessment & Plan Note (Signed)
Cont home BP meds when med rec completed. 

## 2022-02-24 ENCOUNTER — Other Ambulatory Visit (HOSPITAL_COMMUNITY): Payer: TRICARE For Life (TFL)

## 2022-02-24 DIAGNOSIS — R778 Other specified abnormalities of plasma proteins: Secondary | ICD-10-CM | POA: Diagnosis not present

## 2022-02-24 LAB — TROPONIN I (HIGH SENSITIVITY): Troponin I (High Sensitivity): 79 ng/L — ABNORMAL HIGH (ref <18)

## 2022-02-24 NOTE — Discharge Summary (Signed)
Physician Discharge Summary  Scott Zamora [id number]:[id number] DOB: [birth year] DOA: 02/23/2022  PCP: Vidal Schwalbe, MD  Admit date: 02/23/2022  Discharge date: 02/24/2022  Admitted From:Home  Disposition:  Home  Recommendations for Outpatient Follow-up:  Follow up with PCP in 1-2 weeks Recommend getting 30-day Holter monitor at some point with outpatient cardiology follow-up if there is concern for any issues of ongoing arrhythmias Continue other home medications as prior  Home Health: None  Equipment/Devices: None  Discharge Condition:Stable  CODE STATUS: Full  Diet recommendation: Heart Healthy  Brief/Interim Summary:  Scott E Manolis Sr. is a 87 y.o. male with medical history significant of HTN, BPH.   Pt brought in by [facility] from PCP office in [facility]. He originally went to see the doctor for a rash on his foot but while there he c/o weakness. They decided to do a EKG and found pt in tachycardia with a HR of 150 and BP 88/62 per EMS.  He was apparently in sinus rhythm the entire time he was in the ED and was initially noted to have some sinus tachycardia, but his heart rate slowed down after administration of his home verapamil.  It appears that he may have missed a dose of this on medication which likely contributed to the tachycardia.  He is no longer symptomatic and is eager for discharge home.  No other acute events or concerns noted throughout the course of this admission.  He has been instructed to take his medications as prescribed and follow-up with cardiology outpatient to consider Holter monitoring and further evaluation as needed.  Troponin levels were noted to be elevated, but these have stabilized and plateaued.  This was discussed with cardiology with no further need for evaluation at this time.  Discharge Diagnoses:  Principal Problem:   Elevated troponin Active Problems:   Essential hypertension  Principal discharge diagnosis: Sinus tachycardia likely due  to missed medication dose with elevated troponin related to demand ischemia.  Discharge Instructions  Discharge Instructions     Diet - low sodium heart healthy   Complete by: As directed    Increase activity slowly   Complete by: As directed       Allergies as of 02/24/2022   No Known Allergies      Medication List     TAKE these medications    ascorbic acid 250 MG tablet Commonly known as: VITAMIN C Take 250 mg by mouth daily.   aspirin EC 81 MG tablet Take 81 mg by mouth daily.   COQ-10 PO Take 1 tablet by mouth daily. Reported on 09/26/2015   diazepam 2 MG tablet Commonly known as: Valium Take 1 tablet (2 mg total) by mouth every 6 (six) hours as needed for muscle spasms or sedation.   ferrous sulfate 325 (65 FE) MG tablet Take 325 mg by mouth daily.   hydrochlorothiazide 25 MG tablet Commonly known as: HYDRODIURIL Take 12.5 mg by mouth daily.   Linzess 145 MCG Caps capsule Generic drug: linaclotide Take 145 mcg by mouth every morning.   loratadine 10 MG tablet Commonly known as: CLARITIN Take 10 mg by mouth daily as needed. For allergies   omeprazole 20 MG capsule Commonly known as: PRILOSEC Take 20 mg by mouth daily.   tamsulosin 0.4 MG Caps capsule Commonly known as: FLOMAX Take 0.4 mg by mouth daily after supper.   verapamil 240 MG CR tablet Commonly known as: CALAN-SR Take 240 mg by mouth at bedtime.   Vitamin D-3  25 MCG (1000 UT) Caps Take 1,000 Units by mouth daily.        Follow-up Information     Vidal Schwalbe, MD. Schedule an appointment as soon as possible for a visit in 1 week(s).   Specialty: Family Medicine Contact information: [address] [place] [address] [phone]                No Known Allergies  Consultations: None   Procedures/Studies: DG Chest Port 1 View  Result Date: 02/23/2022 CLINICAL DATA:  Shortness of breath, weakness, tachycardia EXAM: PORTABLE CHEST 1 VIEW COMPARISON:   08/05/2012 FINDINGS: The heart size and mediastinal contours are within normal limits. Both lungs are clear. Status post bilateral shoulder total arthroplasty. IMPRESSION: No acute abnormality of the lungs in [facility] portable projection. Electronically Signed   By: Delanna Ahmadi M.D.   On: 02/23/2022 13:52   OCT, Retina - OU - Both Eyes  Result Date: 02/10/2022 Right Eye Quality was good. Scan locations included subfoveal. Central Foveal Thickness: 300. Findings include abnormal foveal contour, retinal drusen . Left Eye Quality was good. Scan locations included subfoveal. Central Foveal Thickness: 306. Findings include abnormal foveal contour, retinal drusen . Notes With dry age-related macular degeneration OU  Color Fundus Photography Optos - OU - Both Eyes  Result Date: 02/10/2022 Right Eye Progression has no prior data. Disc findings include normal observations. Macula : drusen. Vessels : normal observations. Periphery : normal observations. Left Eye Progression has no prior data. Disc findings include normal observations. Macula : drusen. Vessels : normal observations. 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 Age-related macular degeneration OD, intermediate type    Discharge Exam: Vitals:   02/24/22 0602 02/24/22 0753  BP: 134/83 (!) 164/88  Pulse: 68 66  Resp: 16   Temp: 97.8 F (36.6 C)   SpO2: 99% 100%   Vitals:   02/23/22 2047 02/23/22 2138 02/24/22 0602 02/24/22 0753  BP:  (!) 174/97 134/83 (!) 164/88  Pulse:  68 68 66  Resp:  15 16   Temp: 97.6 F (36.4 C) 97.9 F (36.6 C) 97.8 F (36.6 C)   TempSrc: Oral Oral Oral   SpO2:  96% 99% 100%  Weight:      Height:        General: Pt is alert, awake, not in acute distress Cardiovascular: RRR, S1/S2 +, no rubs, no gallops Respiratory: CTA bilaterally, no wheezing, no rhonchi Abdominal: Soft, NT, ND, bowel sounds + Extremities: no edema, no cyanosis    The results of significant diagnostics from this  hospitalization (including imaging, microbiology, ancillary and laboratory) are listed below for reference.     Microbiology: No results found for this or any previous visit (from the past 240 hour(s)).   Labs: BNP (last 3 results) No results for input(s): "BNP" in the last 8760 hours. Basic Metabolic Panel: Recent Labs  Lab 02/23/22 1333  NA 141  K 3.7  CL 106  CO2 30  GLUCOSE 95  BUN 18  CREATININE 0.80  CALCIUM 9.0   Liver Function Tests: Recent Labs  Lab 02/23/22 1333  AST 24  ALT 31  ALKPHOS 58  BILITOT 0.3  PROT 6.6  ALBUMIN 3.8   No results for input(s): "LIPASE", "AMYLASE" in the last 168 hours. No results for input(s): "AMMONIA" in the last 168 hours. CBC: Recent Labs  Lab 02/23/22 1333  WBC 7.0  NEUTROABS 5.1  HGB 12.8*  HCT 38.9*  MCV 91.3  PLT 165  Cardiac Enzymes: No results for input(s): "CKTOTAL", "CKMB", "CKMBINDEX", "TROPONINI" in the last 168 hours. BNP: Invalid input(s): "POCBNP" CBG: No results for input(s): "GLUCAP" in the last 168 hours. D-Dimer No results for input(s): "DDIMER" in the last 72 hours. Hgb A1c No results for input(s): "HGBA1C" in the last 72 hours. Lipid Profile No results for input(s): "CHOL", "HDL", "LDLCALC", "TRIG", "CHOLHDL", "LDLDIRECT" in the last 72 hours. Thyroid function studies No results for input(s): "TSH", "T4TOTAL", "T3FREE", "THYROIDAB" in the last 72 hours.  Invalid input(s): "FREET3" Anemia work up No results for input(s): "VITAMINB12", "FOLATE", "FERRITIN", "TIBC", "IRON", "RETICCTPCT" in the last 72 hours. Urinalysis    Component Value Date/Time   COLORURINE YELLOW 02/23/2022 1245   APPEARANCEUR CLEAR 02/23/2022 1245   LABSPEC 1.005 02/23/2022 1245   PHURINE 7.0 02/23/2022 1245   GLUCOSEU NEGATIVE 02/23/2022 1245   HGBUR NEGATIVE 02/23/2022 1245   BILIRUBINUR NEGATIVE 02/23/2022 1245   KETONESUR NEGATIVE 02/23/2022 1245   PROTEINUR NEGATIVE 02/23/2022 1245   NITRITE NEGATIVE  02/23/2022 1245   LEUKOCYTESUR NEGATIVE 02/23/2022 1245   Sepsis Labs Recent Labs  Lab 02/23/22 1333  WBC 7.0   Microbiology No results found for this or any previous visit (from the past 240 hour(s)).   Time coordinating discharge: 35 minutes  SIGNED:   Rodena Goldmann, DO [facility] 02/24/2022, 9:57 AM  If 7PM-7AM, please contact night-coverage [email or web address]

## 2022-02-24 NOTE — Progress Notes (Signed)
  Transition of Care (TOC) Screening Note   Patient Details  Name: Scott E Newbern Sr. Date of Birth: 03/14/1935   Transition of Care [facility]) CM/SW Contact:    Iona Beard, [place] Phone Number: 02/24/2022, 9:54 AM    Transition of Care Department [facility]) has reviewed patient and no TOC needs have been identified at this time. We will continue to monitor patient advancement through interdisciplinary progression rounds. If new patient transition needs arise, please place a TOC consult.

## 2022-03-23 ENCOUNTER — Encounter (INDEPENDENT_AMBULATORY_CARE_PROVIDER_SITE_OTHER): Payer: Self-pay | Admitting: Ophthalmology

## 2022-03-23 ENCOUNTER — Ambulatory Visit (INDEPENDENT_AMBULATORY_CARE_PROVIDER_SITE_OTHER): Payer: Medicare PPO | Admitting: Ophthalmology

## 2022-03-23 DIAGNOSIS — Z961 Presence of intraocular lens: Secondary | ICD-10-CM

## 2022-03-23 DIAGNOSIS — H353132 Nonexudative age-related macular degeneration, bilateral, intermediate dry stage: Secondary | ICD-10-CM | POA: Diagnosis not present

## 2022-03-23 DIAGNOSIS — H353112 Nonexudative age-related macular degeneration, right eye, intermediate dry stage: Secondary | ICD-10-CM | POA: Diagnosis not present

## 2022-03-23 DIAGNOSIS — H353122 Nonexudative age-related macular degeneration, left eye, intermediate dry stage: Secondary | ICD-10-CM | POA: Diagnosis not present

## 2022-03-23 DIAGNOSIS — H33102 Unspecified retinoschisis, left eye: Secondary | ICD-10-CM | POA: Diagnosis not present

## 2022-03-23 NOTE — Progress Notes (Signed)
03/23/2022     CHIEF COMPLAINT Patient presents for  Chief Complaint  Patient presents with   Macular Degeneration      HISTORY OF PRESENT ILLNESS: Scott E Copland Sr. is a 87 y.o. 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 Last week for glasses and advised him to fu with Dr. Zadie Rhine. Pt stated, "I was seen by a [facility] provider and was told it was an emergency an had to been seen now but this was over the weekend. I didn't want to drive to the hospital so I waited to be seen with Dr. Zadie Rhine. I didn't notice any vision changes but I do see wavy lines at the bottom of my left eye. It comes and goes."  Last edited by Silvestre Moment on 03/23/2022  2:15 PM.      Referring physician: Vidal Schwalbe, MD [address] [place],  [place] [address]  HISTORICAL INFORMATION:   Selected notes from the [place]: No current outpatient medications on file. (Ophthalmic Drugs)   No current facility-administered medications for this visit. (Ophthalmic Drugs)   Current Outpatient Medications (Other)  Medication Sig   ascorbic acid (VITAMIN C) 250 MG tablet Take 250 mg by mouth daily.   aspirin EC 81 MG tablet Take 81 mg by mouth daily.   Cholecalciferol (VITAMIN D-3) 1000 UNITS CAPS Take 1,000 Units by mouth daily.   Coenzyme Q10 (COQ-10 PO) Take 1 tablet by mouth daily. Reported on 09/26/2015   diazepam (VALIUM) 2 MG tablet Take 1 tablet (2 mg total) by mouth every 6 (six) hours as needed for muscle spasms or sedation. (Patient not taking: Reported on 02/24/2022)   ferrous sulfate 325 (65 FE) MG tablet Take 325 mg by mouth daily.   hydrochlorothiazide (HYDRODIURIL) 25 MG tablet Take 12.5 mg by mouth daily.   LINZESS 145 MCG CAPS capsule Take 145 mcg by mouth every morning.   loratadine (CLARITIN) 10 MG tablet Take 10 mg by mouth daily as needed. For allergies   omeprazole (PRILOSEC) 20 MG capsule Take 20  mg by mouth daily.   tamsulosin (FLOMAX) 0.4 MG CAPS capsule Take 0.4 mg by mouth daily after supper.   verapamil (CALAN-SR) 240 MG CR tablet Take 240 mg by mouth at bedtime.   No current facility-administered medications for this visit. (Other)      REVIEW OF SYSTEMS: ROS   Negative for: Constitutional, Gastrointestinal, Neurological, Skin, Genitourinary, Musculoskeletal, HENT, Endocrine, Cardiovascular, Eyes, Respiratory, Psychiatric, Allergic/Imm, Heme/Lymph Last edited by Silvestre Moment on 03/23/2022  2:15 PM.       ALLERGIES No Known Allergies  PAST MEDICAL HISTORY Past Medical History:  Diagnosis Date   Aortic aneurysm (HCC)    Arthritis    BPH (benign prostatic hyperplasia)    Cervical radiculopathy    Dry skin    GERD (gastroesophageal reflux disease)    Hypertension    Inguinal hernia, left    Mixed hyperlipidemia    Onychomycosis    Seasonal allergies    Sleep apnea    has CPAP but does not wear it   Thrombocytopenia (HCC)    Vertigo    hx of   Vitamin D deficiency    Past Surgical History:  Procedure Laterality Date   CATARACT EXTRACTION W/PHACO Bilateral    Dr. Gershon Crane   COLONOSCOPY W/ POLYPECTOMY  EYE SURGERY Bilateral    HERNIA REPAIR     INGUINAL HERNIA REPAIR Left 04/18/2014   Procedure: HERNIA REPAIR INGUINAL ADULT;  Surgeon: Jamesetta So, MD;  Location: [facility];  Service: General;  Laterality: Left;   INSERTION OF MESH Left 04/18/2014   Procedure: INSERTION OF MESH;  Surgeon: Jamesetta So, MD;  Location: [facility];  Service: General;  Laterality: Left;   KNEE ARTHROSCOPY     left knee   TOTAL SHOULDER ARTHROPLASTY Left 08/11/2012   Dr Justice Britain   TOTAL SHOULDER ARTHROPLASTY  08/11/2012   Procedure: TOTAL SHOULDER ARTHROPLASTY;  Surgeon: Marin Shutter, MD;  Location: [facility];  Service: Orthopedics;  Laterality: Left;   TOTAL SHOULDER ARTHROPLASTY Right 09/12/2015   Procedure: RIGHT TOTAL SHOULDER ARTHROPLASTY;  Surgeon: Justice Britain, MD;   Location: [facility];  Service: Orthopedics;  Laterality: Right;    FAMILY HISTORY Family History  Problem Relation Age of Onset   Hypertension Mother    Diabetes Mother    Cancer Sister    Hypertension Brother    Diabetes Brother    Hypertension Sister     SOCIAL HISTORY Social History   Tobacco Use   Smoking status: Never   Smokeless tobacco: Never  Vaping Use   Vaping Use: Never used  Substance Use Topics   Alcohol use: No   Drug use: No         OPHTHALMIC EXAM:  Base Eye Exam     Visual Acuity (ETDRS)       Right Left   Dist cc 20/30 -1 20/25 -2   Dist ph cc NI     Correction: Glasses         Tonometry (Tonopen, 2:21 PM)       Right Left   Pressure 15 17         Pupils       Pupils APD   Right PERRL None   Left PERRL None         Visual Fields       Left Right    Full Full         Extraocular Movement       Right Left    Full, Ortho Full, Ortho         Neuro/Psych     Oriented x3: Yes   Mood/Affect: Normal         Dilation     Both eyes: 1.0% Mydriacyl, 2.5% Phenylephrine @ 2:21 PM           Slit Lamp and Fundus Exam     External Exam       Right Left   External Normal Normal         Slit Lamp Exam       Right Left   Lids/Lashes Normal Normal   Conjunctiva/Sclera White and quiet White and quiet   Cornea Clear Clear   Anterior Chamber Deep and quiet Deep and quiet   Iris Round and reactive Round and reactive   Lens Posterior chamber intraocular lens Posterior chamber intraocular lens   Anterior Vitreous Normal Normal         Fundus Exam       Right Left   Posterior Vitreous Normal Normal   Disc Normal Normal   C/D Ratio 0.4 0.5   Macula Normal Normal   Vessels Normal Normal   Periphery Normal Peripheral retinoschisis, extends from 1030 -130 superiorly, with no inner or outer retinal breaks, Schisis  cavity, 10 30-1 meridian, extends posterior to the equator, no progression             IMAGING AND PROCEDURES  Imaging and Procedures for 03/23/22  OCT, Retina - OU - Both Eyes       Right Eye Quality was good. Scan locations included subfoveal. Central Foveal Thickness: 299. Findings include abnormal foveal contour, retinal drusen .   Left Eye Quality was good. Scan locations included subfoveal. Central Foveal Thickness: 304. Findings include abnormal foveal contour, retinal drusen .   Notes With dry age-related macular degeneration OU     Color Fundus Photography Optos - OU - Both Eyes       Right Eye Progression has no prior data. Disc findings include normal observations. Macula : drusen. Vessels : normal observations. Periphery : normal observations.   Left Eye Progression has no prior data. Disc findings include normal observations. Macula : drusen. Vessels : normal observations.   Notes Bullous Peripheral retinoschisis superiorly in the left eye, no change over the last 2-1/2 years  Age-related macular degeneration OD, intermediate type             ASSESSMENT/PLAN:  Intermediate stage nonexudative age-related macular degeneration of right eye No high risk features stable over time  Intermediate stage nonexudative age-related macular degeneration of left eye No high risk features, no complications  Left retinoschisis Superotemporal bullous retinoschisis, no interval change of the last 2 and half years.  There are no outer holes and no intervals, treatment is observe  Pseudophakia, both eyes Stable over time looks great     ICD-10-CM   1. Intermediate stage nonexudative age-related macular degeneration of right eye  H35.3112 OCT, Retina - OU - Both Eyes    Color Fundus Photography Optos - OU - Both Eyes    2. 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 Left retinoschisis  H33.102     4. Pseudophakia, both eyes  Z96.1       1.  OU with intermediate AMD.  Stable over time no signs  of complications  2.  OS with bullous superotemporal retinoschisis, with no high risk features and no operable features.  Stable over last 2 and half years observe  3.  Ophthalmic Meds Ordered this visit:  No orders of the defined types were placed in this encounter.      Return in about 1 year (around 03/24/2023) for DILATE OU, COLOR FP.  There are no Patient Instructions on file for this visit.   Explained the diagnoses, plan, and follow up with the patient and they expressed understanding.  Patient expressed understanding of the importance of proper follow up care.   Clent Demark Rheanna Sergent M.D. Diseases & Surgery of the Retina and Vitreous [facility] [place] 03/23/22     Abbreviations: M myopia (nearsighted); A astigmatism; H hyperopia (farsighted); P presbyopia; Mrx spectacle prescription;  CTL contact lenses; OD right eye; OS left eye; OU both eyes  XT exotropia; ET esotropia; PEK punctate epithelial keratitis; PEE punctate epithelial erosions; DES dry eye syndrome; MGD meibomian gland dysfunction; ATs artificial tears; PFAT's preservative free artificial tears; [place] nuclear sclerotic cataract; PSC posterior subcapsular cataract; ERM epi-retinal membrane; PVD posterior vitreous detachment; RD retinal detachment; DM diabetes mellitus; DR diabetic retinopathy; NPDR non-proliferative diabetic retinopathy; PDR proliferative diabetic retinopathy; CSME clinically significant macular edema; DME diabetic macular edema; dbh dot blot hemorrhages; CWS cotton wool spot; POAG primary open angle glaucoma; C/D  cup-to-disc ratio; HVF humphrey visual field; GVF goldmann visual field; OCT optical coherence tomography; IOP intraocular pressure; BRVO Branch retinal vein occlusion; CRVO central retinal vein occlusion; CRAO central retinal artery occlusion; BRAO branch retinal artery occlusion; RT retinal tear; SB scleral buckle; PPV pars plana vitrectomy; VH Vitreous hemorrhage; PRP panretinal laser  photocoagulation; IVK intravitreal kenalog; VMT vitreomacular traction; MH Macular hole;  NVD neovascularization of the disc; NVE neovascularization elsewhere; AREDS age related eye disease study; ARMD age related macular degeneration; POAG primary open angle glaucoma; EBMD epithelial/anterior basement membrane dystrophy; ACIOL anterior chamber intraocular lens; IOL intraocular lens; PCIOL posterior chamber intraocular lens; Phaco/IOL phacoemulsification with intraocular lens placement; [place] photorefractive keratectomy; LASIK laser assisted in situ keratomileusis; HTN hypertension; DM diabetes mellitus; COPD chronic obstructive pulmonary disease

## 2022-03-23 NOTE — Assessment & Plan Note (Signed)
Stable over time looks great

## 2022-03-23 NOTE — Assessment & Plan Note (Signed)
Superotemporal bullous retinoschisis, no interval change of the last 2 and half years.  There are no outer holes and no intervals, treatment is observe

## 2022-03-23 NOTE — Assessment & Plan Note (Signed)
No high risk features stable over time

## 2022-03-23 NOTE — Assessment & Plan Note (Signed)
No high risk features, no complications

## 2022-04-06 ENCOUNTER — Other Ambulatory Visit: Payer: Self-pay | Admitting: Internal Medicine

## 2022-04-06 DIAGNOSIS — I7121 Aneurysm of the ascending aorta, without rupture: Secondary | ICD-10-CM

## 2022-04-14 ENCOUNTER — Ambulatory Visit
Admission: RE | Admit: 2022-04-14 | Discharge: 2022-04-14 | Disposition: A | Discharge status: Home / Self Care | Payer: Medicare PPO | Source: Ambulatory Visit | Attending: Internal Medicine | Admitting: Internal Medicine

## 2022-04-14 DIAGNOSIS — I7121 Aneurysm of the ascending aorta, without rupture: Secondary | ICD-10-CM | POA: Insufficient documentation

## 2022-04-14 LAB — POCT I-STAT CREATININE: Creatinine, Ser: 0.9 mg/dL (ref 0.61–1.24)

## 2022-04-14 MED ORDER — IOHEXOL 350 MG/ML SOLN
75.0000 mL | Freq: Once | INTRAVENOUS | Status: AC | PRN
  Administered 2022-04-14: 75 mL via INTRAVENOUS

## 2022-04-15 ENCOUNTER — Ambulatory Visit: Admission: RE | Admit: 2022-04-15 | Payer: TRICARE For Life (TFL) | Source: Ambulatory Visit

## 2022-08-17 ENCOUNTER — Ambulatory Visit
Admission: EM | Admit: 2022-08-17 | Discharge: 2022-08-17 | Disposition: A | Discharge status: Home / Self Care | Payer: Medicare PPO | Attending: Family Medicine | Admitting: Family Medicine

## 2022-08-17 DIAGNOSIS — J069 Acute upper respiratory infection, unspecified: Secondary | ICD-10-CM | POA: Insufficient documentation

## 2022-08-17 DIAGNOSIS — R059 Cough, unspecified: Secondary | ICD-10-CM | POA: Insufficient documentation

## 2022-08-17 DIAGNOSIS — Z1152 Encounter for screening for COVID-19: Secondary | ICD-10-CM | POA: Insufficient documentation

## 2022-08-17 MED ORDER — BENZONATATE 200 MG PO CAPS: 200.0000 mg | ORAL_CAPSULE | Freq: Three times a day (TID) | ORAL | 0 refills | Status: DC | PRN

## 2022-08-17 NOTE — ED Provider Notes (Signed)
[facility]    CSN: [id number] Arrival date & time: 08/17/22  4034      History   Chief Complaint No chief complaint on file.   HPI Scott Zamora. is a 88 y.o. male.   Patient presenting today with 4 to 5-day history of cough productive of mucus, chills, nasal congestion and rhinorrhea, scratchy throat.  Denies fever, chills, chest pain, shortness of breath, abdominal pain, nausea vomiting or diarrhea.  States he tries not to take over-the-counter cold medicine as it tends to make his symptoms worse historically.  He has a prescription for Ladona Ridgel from 2015 that he uses when he gets colds, requesting an up-to-date prescription today of these.  Denies history of chronic pulmonary disease, no known sick contacts recently.    Past Medical History:  Diagnosis Date   Aortic aneurysm (HCC)    Arthritis    BPH (benign prostatic hyperplasia)    Cervical radiculopathy    Dry skin    GERD (gastroesophageal reflux disease)    Hypertension    Inguinal hernia, left    Mixed hyperlipidemia    Onychomycosis    Seasonal allergies    Sleep apnea    has CPAP but does not wear it   Thrombocytopenia ([place])    Vertigo    hx of   Vitamin D deficiency     Patient Active Problem List   Diagnosis Date Noted   Pseudophakia, both eyes 03/23/2022   Elevated troponin 02/23/2022   Intermediate stage nonexudative age-related macular degeneration of left eye 02/10/2021   Thrombocytopenia ([place]) 03/14/2020   Intermediate stage nonexudative age-related macular degeneration of right eye 11/21/2019   Left retinoschisis 11/21/2019   Essential hypertension 05/11/2019   Gastroesophageal reflux disease without esophagitis 05/11/2019   S/P shoulder replacement 08/29/2012   Pain in joint, shoulder region 08/29/2012   Muscle weakness (generalized) 08/29/2012   Arthritis of shoulder 08/12/2012    Past Surgical History:  Procedure Laterality Date   CATARACT EXTRACTION W/PHACO  Bilateral    Dr. Gershon Crane   COLONOSCOPY W/ POLYPECTOMY     EYE SURGERY Bilateral    [place] Left 04/18/2014   Procedure: HERNIA REPAIR INGUINAL ADULT;  Surgeon: Jamesetta So, MD;  Location: [facility];  Service: General;  Laterality: Left;   INSERTION OF MESH Left 04/18/2014   Procedure: INSERTION OF MESH;  Surgeon: Jamesetta So, MD;  Location: [facility];  Service: General;  Laterality: Left;   KNEE ARTHROSCOPY     left knee   TOTAL SHOULDER ARTHROPLASTY Left 08/11/2012   Dr Justice Britain   TOTAL SHOULDER ARTHROPLASTY  08/11/2012   Procedure: TOTAL SHOULDER ARTHROPLASTY;  Surgeon: Marin Shutter, MD;  Location: [facility];  Service: Orthopedics;  Laterality: Left;   TOTAL SHOULDER ARTHROPLASTY Right 09/12/2015   Procedure: RIGHT TOTAL SHOULDER ARTHROPLASTY;  Surgeon: Justice Britain, MD;  Location: [facility];  Service: Orthopedics;  Laterality: Right;       Home Medications    Prior to Admission medications   Medication Sig Start Date End Date Taking? Authorizing Provider  benzonatate (TESSALON) 200 MG capsule Take 1 capsule (200 mg total) by mouth 3 (three) times daily as needed for cough. 08/17/22  Yes Volney American, PA-C  ascorbic acid (VITAMIN C) 250 MG tablet Take 250 mg by mouth daily.    [provider]  aspirin EC 81 MG tablet Take 81 mg by mouth daily.  [provider]  Cholecalciferol (VITAMIN D-3) 1000 UNITS CAPS Take 1,000 Units by mouth daily.    [provider]  Coenzyme Q10 (COQ-10 PO) Take 1 tablet by mouth daily. Reported on 09/26/2015    [provider]  diazepam (VALIUM) 2 MG tablet Take 1 tablet (2 mg total) by mouth every 6 (six) hours as needed for muscle spasms or sedation. Patient not taking: Reported on 02/24/2022 09/13/15   Shuford, Olivia Mackie, PA-C  ferrous sulfate 325 (65 FE) MG tablet Take 325 mg by mouth daily.    [provider]  hydrochlorothiazide (HYDRODIURIL) 25 MG tablet Take 12.5 mg by  mouth daily.    [provider]  LINZESS 145 MCG CAPS capsule Take 145 mcg by mouth every morning. 02/02/22   [provider]  loratadine (CLARITIN) 10 MG tablet Take 10 mg by mouth daily as needed. For allergies    [provider]  omeprazole (PRILOSEC) 20 MG capsule Take 20 mg by mouth daily.    [provider]  tamsulosin (FLOMAX) 0.4 MG CAPS capsule Take 0.4 mg by mouth daily after supper.    [provider]  verapamil (CALAN-SR) 240 MG CR tablet Take 240 mg by mouth at bedtime.    [provider]    Family History Family History  Problem Relation Age of Onset   Hypertension Mother    Diabetes Mother    Cancer Sister    Hypertension Brother    Diabetes Brother    Hypertension Sister     Social History Social History   Tobacco Use   Smoking status: Never   Smokeless tobacco: Never  Vaping Use   Vaping Use: Never used  Substance Use Topics   Alcohol use: No   Drug use: No     Allergies   Patient has no known allergies.   Review of Systems Review of Systems Per HPI  Physical Exam Triage Vital Signs ED Triage Vitals  Enc Vitals Group     BP 08/17/22 0833 (!) 157/90     Pulse Rate 08/17/22 0833 94     Resp 08/17/22 0833 20     Temp 08/17/22 0833 97.7 F (36.5 C)     Temp Source 08/17/22 0833 Oral     SpO2 08/17/22 0833 97 %     Weight --      Height --      Head Circumference --      Peak Flow --      Pain Score 08/17/22 0836 0     Pain Loc --      Pain Edu? --      Excl. in [place]? --    No data found.  Updated Vital Signs BP (!) 157/90 (BP Location: Right Arm)   Pulse 94   Temp 97.7 F (36.5 C) (Oral)   Resp 20   SpO2 97%   Visual Acuity Right Eye Distance:   Left Eye Distance:   Bilateral Distance:    Right Eye Near:   Left Eye Near:    Bilateral Near:     Physical Exam Vitals and nursing note reviewed.  Constitutional:      Appearance: He is well-developed.  HENT:     Head:  Atraumatic.     Right Ear: External ear normal.     Left Ear: External ear normal.     Nose: Rhinorrhea present.     Mouth/Throat:     Pharynx: Posterior oropharyngeal erythema present. No oropharyngeal exudate.  Eyes:     Conjunctiva/sclera: Conjunctivae normal.     Pupils: Pupils are equal, round, and reactive to light.  Cardiovascular:     Rate and Rhythm: Normal rate and regular rhythm.  Pulmonary:     Effort: Pulmonary effort is normal. No respiratory distress.     Breath sounds: No wheezing or rales.  Musculoskeletal:        General: Normal range of motion.     Cervical back: Normal range of motion and neck supple.  Lymphadenopathy:     Cervical: No cervical adenopathy.  Skin:    General: Skin is warm and dry.  Neurological:     Mental Status: He is alert and oriented to person, place, and time.  Psychiatric:        Behavior: Behavior normal.      UC Treatments / Results  Labs (all labs ordered are listed, but only abnormal results are displayed) Labs Reviewed  SARS CORONAVIRUS 2 (TAT 6-24 HRS)    EKG   Radiology No results found.  Procedures Procedures (including critical care time)  Medications Ordered in UC Medications - No data to display  Initial Impression / Assessment and Plan / UC Course  I have reviewed the triage vital signs and the nursing notes.  Pertinent labs & imaging results that were available during my care of the patient were reviewed by me and considered in my medical decision making (see chart for details).     Vital signs and exam very reassuring today, suspect viral upper respiratory infection.  COVID test pending, refill Tessalon Perles, discussed supportive over-the-counter medications and home care additionally.  Return for worsening symptoms.  Final Clinical Impressions(s) / UC Diagnoses   Final diagnoses:  Viral URI with cough     Discharge Instructions      I have sent over Tessalon Perles to help with your cough,  and you may use Flonase nasal spray twice daily as well as Coricidin HBP over-the-counter for cold and congestion.  Drink plenty of fluids and get lots of rest.  Your COVID results should be available tomorrow, someone will call you if they are positive.    ED Prescriptions     Medication Sig Dispense Auth. Provider   benzonatate (TESSALON) 200 MG capsule Take 1 capsule (200 mg total) by mouth 3 (three) times daily as needed for cough. 20 capsule Volney American, [place]      PDMP not reviewed this encounter.   Volney American, [place] 08/17/22 1257

## 2022-08-17 NOTE — ED Triage Notes (Signed)
Pt reports with a cough, chills, restless, gray mucus x 8 days

## 2022-08-17 NOTE — Discharge Instructions (Signed)
I have sent over Tessalon Perles to help with your cough, and you may use Flonase nasal spray twice daily as well as Coricidin HBP over-the-counter for cold and congestion.  Drink plenty of fluids and get lots of rest.  Your COVID results should be available tomorrow, someone will call you if they are positive.

## 2022-08-18 LAB — SARS CORONAVIRUS 2 (TAT 6-24 HRS): SARS Coronavirus 2: NEGATIVE

## 2023-01-31 ENCOUNTER — Ambulatory Visit
Admission: EM | Admit: 2023-01-31 | Discharge: 2023-01-31 | Disposition: A | Discharge status: Home / Self Care | Payer: TRICARE For Life (TFL)

## 2023-01-31 ENCOUNTER — Encounter: Payer: Self-pay | Admitting: Emergency Medicine

## 2023-01-31 DIAGNOSIS — U071 COVID-19: Secondary | ICD-10-CM | POA: Diagnosis not present

## 2023-01-31 NOTE — ED Provider Notes (Signed)
[facility]    CSN: [id number] Arrival date & time: 01/31/23  0850      History   Chief Complaint No chief complaint on file.   HPI Scott Zamora. is a 88 y.o. male.   The history is provided by the patient.   Patient presents for complaints of fatigue, nasal congestion, sore throat and cough.  Patient states symptoms started a week ago.  Patient states the nasal congestion, cough and sore throat have since improved.  He states he continues to experience fatigue.  The patient denies fever, chills, headache, ear pain, ear drainage, wheezing, shortness of breath, difficulty breathing, chest pain, abdominal pain, nausea, ting, or diarrhea.  Patient states he has been taking over-the-counter Tylenol, and using an over-the-counter cough medication along with a nasal spray for his symptoms.  Patient reports that there were several members of the church who tested positive for COVID, and he was advised by the charge nurse to test at home.  Patient states that his wife tested positive for COVID last evening.  He states that he did test negative last evening as well.  Past Medical History:  Diagnosis Date   Aortic aneurysm (HCC)    Arthritis    BPH (benign prostatic hyperplasia)    Cervical radiculopathy    Dry skin    GERD (gastroesophageal reflux disease)    Hypertension    Inguinal hernia, left    Mixed hyperlipidemia    Onychomycosis    Seasonal allergies    Sleep apnea    has CPAP but does not wear it   Thrombocytopenia (HCC)    Vertigo    hx of   Vitamin D deficiency     Patient Active Problem List   Diagnosis Date Noted   Pseudophakia, both eyes 03/23/2022   Elevated troponin 02/23/2022   Intermediate stage nonexudative age-related macular degeneration of left eye 02/10/2021   Thrombocytopenia (HCC) 03/14/2020   Intermediate stage nonexudative age-related macular degeneration of right eye 11/21/2019   Left retinoschisis 11/21/2019   Essential  hypertension 05/11/2019   Gastroesophageal reflux disease without esophagitis 05/11/2019   S/P shoulder replacement 08/29/2012   Pain in joint, shoulder region 08/29/2012   Muscle weakness (generalized) 08/29/2012   Arthritis of shoulder 08/12/2012    Past Surgical History:  Procedure Laterality Date   CATARACT EXTRACTION W/PHACO Bilateral    Dr. Nile Riggs   COLONOSCOPY W/ POLYPECTOMY     EYE SURGERY Bilateral    HERNIA REPAIR     INGUINAL HERNIA REPAIR Left 04/18/2014   Procedure: HERNIA REPAIR INGUINAL ADULT;  Surgeon: Dalia Heading, MD;  Location: [facility];  Service: General;  Laterality: Left;   INSERTION OF MESH Left 04/18/2014   Procedure: INSERTION OF MESH;  Surgeon: Dalia Heading, MD;  Location: [facility];  Service: General;  Laterality: Left;   KNEE ARTHROSCOPY     left knee   TOTAL SHOULDER ARTHROPLASTY Left 08/11/2012   Dr Francena Hanly   TOTAL SHOULDER ARTHROPLASTY  08/11/2012   Procedure: TOTAL SHOULDER ARTHROPLASTY;  Surgeon: Senaida Lange, MD;  Location: [facility];  Service: Orthopedics;  Laterality: Left;   TOTAL SHOULDER ARTHROPLASTY Right 09/12/2015   Procedure: RIGHT TOTAL SHOULDER ARTHROPLASTY;  Surgeon: Francena Hanly, MD;  Location: [facility];  Service: Orthopedics;  Laterality: Right;       Home Medications    Prior to Admission medications   Medication Sig Start Date End Date Taking? Authorizing Provider  ascorbic acid (VITAMIN C) 250  MG tablet Take 250 mg by mouth daily.    [provider]  aspirin EC 81 MG tablet Take 81 mg by mouth daily.    [provider]  benzonatate (TESSALON) 200 MG capsule Take 1 capsule (200 mg total) by mouth 3 (three) times daily as needed for cough. 08/17/22   Particia Nearing, PA-C  Cholecalciferol (VITAMIN D-3) 1000 UNITS CAPS Take 1,000 Units by mouth daily.    [provider]  Coenzyme Q10 (COQ-10 PO) Take 1 tablet by mouth daily. Reported on 09/26/2015    [provider]  diazepam (VALIUM) 2 MG  tablet Take 1 tablet (2 mg total) by mouth every 6 (six) hours as needed for muscle spasms or sedation. Patient not taking: Reported on 02/24/2022 09/13/15   Shuford, French Ana, PA-C  ferrous sulfate 325 (65 FE) MG tablet Take 325 mg by mouth daily.    [provider]  hydrochlorothiazide (HYDRODIURIL) 25 MG tablet Take 12.5 mg by mouth daily.    [provider]  LINZESS 145 MCG CAPS capsule Take 145 mcg by mouth every morning. 02/02/22   [provider]  loratadine (CLARITIN) 10 MG tablet Take 10 mg by mouth daily as needed. For allergies    [provider]  omeprazole (PRILOSEC) 20 MG capsule Take 20 mg by mouth daily.    [provider]  tamsulosin (FLOMAX) 0.4 MG CAPS capsule Take 0.4 mg by mouth daily after supper.    [provider]  verapamil (CALAN-SR) 240 MG CR tablet Take 240 mg by mouth at bedtime.    [provider]    Family History Family History  Problem Relation Age of Onset   Hypertension Mother    Diabetes Mother    Cancer Sister    Hypertension Brother    Diabetes Brother    Hypertension Sister     Social History Social History   Tobacco Use   Smoking status: Never   Smokeless tobacco: Never  Vaping Use   Vaping status: Never Used  Substance Use Topics   Alcohol use: No   Drug use: No     Allergies   Patient has no known allergies.   Review of Systems Review of Systems Per HPI  Physical Exam Triage Vital Signs ED Triage Vitals  Encounter Vitals Group     BP 01/31/23 0856 130/76     Systolic BP Percentile --      Diastolic BP Percentile --      Pulse Rate 01/31/23 0856 91     Resp 01/31/23 0856 18     Temp 01/31/23 0856 (!) 97.5 F (36.4 C)     Temp Source 01/31/23 0856 Oral     SpO2 01/31/23 0856 96 %     Weight --      Height --      Head Circumference --      Peak Flow --      Pain Score 01/31/23 0859 4     Pain Loc --      Pain Education --      Exclude from Growth Chart --     No data found.  Updated Vital Signs BP 130/76 (BP Location: Right Arm)   Pulse 91   Temp (!) 97.5 F (36.4 C) (Oral)   Resp 18   SpO2 96%   Visual Acuity Right Eye Distance:   Left Eye Distance:   Bilateral Distance:    Right Eye Near:   Left Eye Near:  Bilateral Near:     Physical Exam Vitals and nursing note reviewed.  Constitutional:      General: He is not in acute distress.    Appearance: Normal appearance.  HENT:     Head: Normocephalic.     Right Ear: Tympanic membrane, ear canal and external ear normal.     Left Ear: Tympanic membrane, ear canal and external ear normal.     Nose: Congestion present.     Right Turbinates: Enlarged and swollen.     Left Turbinates: Enlarged and swollen.     Right Sinus: No maxillary sinus tenderness or frontal sinus tenderness.     Left Sinus: No maxillary sinus tenderness or frontal sinus tenderness.     Mouth/Throat:     Lips: Pink.     Mouth: Mucous membranes are moist.     Pharynx: Oropharynx is clear. Uvula midline. Posterior oropharyngeal erythema and postnasal drip present. No pharyngeal swelling, oropharyngeal exudate or uvula swelling.     Comments: Cobblestoning present to posterior oropharynx Eyes:     Extraocular Movements: Extraocular movements intact.     Conjunctiva/sclera: Conjunctivae normal.     Pupils: Pupils are equal, round, and reactive to light.  Cardiovascular:     Rate and Rhythm: Normal rate and regular rhythm.     Pulses: Normal pulses.     Heart sounds: Normal heart sounds.  Pulmonary:     Effort: Pulmonary effort is normal. No respiratory distress.     Breath sounds: Normal breath sounds. No stridor. No wheezing, rhonchi or rales.  Abdominal:     General: Bowel sounds are normal.     Palpations: Abdomen is soft.     Tenderness: There is no abdominal tenderness.  Musculoskeletal:     Cervical back: Normal range of motion.  Lymphadenopathy:     Cervical: No cervical adenopathy.  Skin:     General: Skin is warm and dry.  Neurological:     General: No focal deficit present.     Mental Status: He is alert and oriented to person, place, and time.  Psychiatric:        Mood and Affect: Mood normal.        Behavior: Behavior normal.      UC Treatments / Results  Labs (all labs ordered are listed, but only abnormal results are displayed) Labs Reviewed - No data to display  EKG   Radiology No results found.  Procedures Procedures (including critical care time)  Medications Ordered in UC Medications - No data to display  Initial Impression / Assessment and Plan / UC Course  I have reviewed the triage vital signs and the nursing notes.  Pertinent labs & imaging results that were available during my care of the patient were reviewed by me and considered in my medical decision making (see chart for details).  The patient is well-appearing, he is in no acute distress, vital signs are stable.  Symptoms most likely caused by COVID, as patient's wife tested positive last evening.  His symptoms have been present for 1 week, which may explain why he tested negative.  He continues to present with fatigue.  Supportive care recommendations were provided and discussed with the patient to include increasing fluids, allowing for plenty of rest, and continuing the nasal spray and over-the-counter cough medicine patient is currently taking.  Patient was advised that if he is continue to experience symptoms, he should wear his mask for an additional 5 days.  Patient was given strict  ER follow-up precautions.  Patient also advised to inform his doctor that he recently had COVID.  Patient was in agreement with this plan of care and verbalizes understanding.  All questions were answered.  Patient stable for discharge.   Final Clinical Impressions(s) / UC Diagnoses   Final diagnoses:  COVID-19     Discharge Instructions      As discussed, your symptoms were most likely caused by  COVID.  You are out of the window to receive treatment at this time. Continue to take your over-the-counter medications as directed. Increase fluids and allow for plenty of rest. Symptoms should improve over the next 5 to 7 days.  If symptoms or not improving, or if they appear to be worsening, please follow-up in this clinic or with your primary care physician for further evaluation. Go to the emergency department if you experience shortness of breath, difficulty breathing, fever, chills, or other concerns. Please advise your doctor of your recent COVID diagnosis. Follow-up as needed.     ED Prescriptions   None    PDMP not reviewed this encounter.   Abran Cantor, NP 01/31/23 [phone]

## 2023-01-31 NOTE — Discharge Instructions (Signed)
As discussed, your symptoms were most likely caused by COVID.  You are out of the window to receive treatment at this time. Continue to take your over-the-counter medications as directed. Increase fluids and allow for plenty of rest. Symptoms should improve over the next 5 to 7 days.  If symptoms or not improving, or if they appear to be worsening, please follow-up in this clinic or with your primary care physician for further evaluation. Go to the emergency department if you experience shortness of breath, difficulty breathing, fever, chills, or other concerns. Please advise your doctor of your recent COVID diagnosis. Follow-up as needed.

## 2023-01-31 NOTE — ED Triage Notes (Signed)
Nasal congestion and sore throat x 1 week.  exposed to covid.  Wife tested covid positive.  States he feels fatigued.  Has been taking tylenol and an antihistamine to treat symptoms.

## 2023-02-11 ENCOUNTER — Encounter (INDEPENDENT_AMBULATORY_CARE_PROVIDER_SITE_OTHER): Payer: TRICARE For Life (TFL) | Admitting: Ophthalmology

## 2023-02-25 ENCOUNTER — Ambulatory Visit (INDEPENDENT_AMBULATORY_CARE_PROVIDER_SITE_OTHER): Payer: Medicare PPO | Admitting: General Surgery

## 2023-02-25 ENCOUNTER — Encounter: Payer: Self-pay | Admitting: General Surgery

## 2023-02-25 VITALS — BP 132/77 | HR 79 | Temp 97.5°F | Resp 16 | Ht 72.0 in | Wt 156.0 lb

## 2023-02-25 DIAGNOSIS — K4090 Unilateral inguinal hernia, without obstruction or gangrene, not specified as recurrent: Secondary | ICD-10-CM | POA: Diagnosis not present

## 2023-02-25 NOTE — Progress Notes (Signed)
Scott Zamora.; [id number]; 17-Jun-1935   HPI Patient is an 88 year old black male who was referred to my care by Alvina Filbert, MD for evaluation treatment of a right inguinal hernia.  He states he started noticing a bulge in the right groin region several months ago.  It sticks out when he is straining.  It is not affecting his daily lifestyle.  He denies any nausea or vomiting.  It easily resolves when lying down or pushing it back in.  He has not had an episode of incarceration.  He denies any pain in the scrotum.  He is status post a left inguinal herniorrhaphy with mesh in the remote past. Past Medical History:  Diagnosis Date   Aortic aneurysm (HCC)    Arthritis    BPH (benign prostatic hyperplasia)    Cervical radiculopathy    Dry skin    GERD (gastroesophageal reflux disease)    Hypertension    Inguinal hernia, left    Mixed hyperlipidemia    Onychomycosis    Seasonal allergies    Sleep apnea    has CPAP but does not wear it   Thrombocytopenia (HCC)    Vertigo    hx of   Vitamin D deficiency     Past Surgical History:  Procedure Laterality Date   CATARACT EXTRACTION W/PHACO Bilateral    Dr. Nile Riggs   COLONOSCOPY W/ POLYPECTOMY     EYE SURGERY Bilateral    HERNIA REPAIR     INGUINAL HERNIA REPAIR Left 04/18/2014   Procedure: HERNIA REPAIR INGUINAL ADULT;  Surgeon: Dalia Heading, MD;  Location: [facility];  Service: General;  Laterality: Left;   INSERTION OF MESH Left 04/18/2014   Procedure: INSERTION OF MESH;  Surgeon: Dalia Heading, MD;  Location: [facility];  Service: General;  Laterality: Left;   KNEE ARTHROSCOPY     left knee   TOTAL SHOULDER ARTHROPLASTY Left 08/11/2012   Dr Francena Hanly   TOTAL SHOULDER ARTHROPLASTY  08/11/2012   Procedure: TOTAL SHOULDER ARTHROPLASTY;  Surgeon: Senaida Lange, MD;  Location: [facility];  Service: Orthopedics;  Laterality: Left;   TOTAL SHOULDER ARTHROPLASTY Right 09/12/2015   Procedure: RIGHT TOTAL SHOULDER ARTHROPLASTY;  Surgeon:  Francena Hanly, MD;  Location: [facility];  Service: Orthopedics;  Laterality: Right;    Family History  Problem Relation Age of Onset   Hypertension Mother    Diabetes Mother    Cancer Sister    Hypertension Brother    Diabetes Brother    Hypertension Sister     Current Outpatient Medications on File Prior to Visit  Medication Sig Dispense Refill   ascorbic acid (VITAMIN C) 250 MG tablet Take 250 mg by mouth daily.     aspirin EC 81 MG tablet Take 81 mg by mouth daily.     benzonatate (TESSALON) 200 MG capsule Take 1 capsule (200 mg total) by mouth 3 (three) times daily as needed for cough. 20 capsule 0   Cholecalciferol (VITAMIN D-3) 1000 UNITS CAPS Take 1,000 Units by mouth daily.     Coenzyme Q10 (COQ-10 PO) Take 1 tablet by mouth daily. Reported on 09/26/2015     ferrous sulfate 325 (65 FE) MG tablet Take 325 mg by mouth daily.     hydrochlorothiazide (HYDRODIURIL) 25 MG tablet Take 12.5 mg by mouth daily.     LINZESS 145 MCG CAPS capsule Take 145 mcg by mouth every morning.     loratadine (CLARITIN) 10 MG tablet Take 10 mg by  mouth daily as needed. For allergies     omeprazole (PRILOSEC) 20 MG capsule Take 20 mg by mouth daily.     tamsulosin (FLOMAX) 0.4 MG CAPS capsule Take 0.4 mg by mouth daily after supper.     verapamil (CALAN-SR) 240 MG CR tablet Take 240 mg by mouth at bedtime.     diazepam (VALIUM) 2 MG tablet Take 1 tablet (2 mg total) by mouth every 6 (six) hours as needed for muscle spasms or sedation. (Patient not taking: Reported on 02/24/2022) 30 tablet 1   No current facility-administered medications on file prior to visit.    No Known Allergies  Social History   Substance and Sexual Activity  Alcohol Use No    Social History   Tobacco Use  Smoking Status Never  Smokeless Tobacco Never    Review of Systems  Constitutional:  Positive for malaise/fatigue.  HENT:  Positive for sinus pain.   Eyes: Negative.   Respiratory:  Positive for cough.    Cardiovascular: Negative.   Gastrointestinal: Negative.   Genitourinary: Negative.   Musculoskeletal:  Positive for joint pain and neck pain.  Skin: Negative.   Neurological: Negative.   Endo/Heme/Allergies: Negative.   Psychiatric/Behavioral: Negative.      Objective   Vitals:   02/25/23 0857  BP: 132/77  Pulse: 79  Resp: 16  Temp: (!) 97.5 F (36.4 C)  SpO2: 95%    Physical Exam Vitals reviewed.  Constitutional:      Appearance: Normal appearance. He is normal weight. He is not ill-appearing.  HENT:     Head: Normocephalic and atraumatic.  Cardiovascular:     Rate and Rhythm: Normal rate and regular rhythm.     Heart sounds: Normal heart sounds. No murmur heard.    No friction rub. No gallop.  Pulmonary:     Effort: Pulmonary effort is normal. No respiratory distress.     Breath sounds: Normal breath sounds. No stridor. No wheezing, rhonchi or rales.  Abdominal:     General: Abdomen is flat. Bowel sounds are normal. There is no distension.     Palpations: Abdomen is soft. There is no mass.     Tenderness: There is no abdominal tenderness. There is no guarding or rebound.     Hernia: A hernia is present.     Comments: Easily reducible small right inguinal hernia.  Genitourinary:    Testes: Normal.  Skin:    General: Skin is warm and dry.  Neurological:     Mental Status: He is alert and oriented to person, place, and time.     Assessment  Right inguinal hernia Plan  I told the patient that he does not need immediate repair of the hernia as he is mostly asymptomatic.  He understands and agrees.  I told him should it start affecting his daily lifestyle or becomes more symptomatic, he should return to my care for reevaluation.  He is fine with that.  Follow-up with me as needed.

## 2023-03-04 ENCOUNTER — Ambulatory Visit: Payer: Medicare PPO | Admitting: General Surgery

## 2023-03-25 ENCOUNTER — Encounter (INDEPENDENT_AMBULATORY_CARE_PROVIDER_SITE_OTHER): Payer: TRICARE For Life (TFL) | Admitting: Ophthalmology

## 2023-06-16 ENCOUNTER — Other Ambulatory Visit (HOSPITAL_COMMUNITY): Payer: Self-pay | Admitting: Internal Medicine

## 2023-06-16 DIAGNOSIS — F09 Unspecified mental disorder due to known physiological condition: Secondary | ICD-10-CM

## 2023-06-23 ENCOUNTER — Other Ambulatory Visit: Payer: Self-pay | Admitting: Cardiology

## 2023-06-23 DIAGNOSIS — I2510 Atherosclerotic heart disease of native coronary artery without angina pectoris: Secondary | ICD-10-CM

## 2023-06-23 DIAGNOSIS — I7121 Aneurysm of the ascending aorta, without rupture: Secondary | ICD-10-CM

## 2023-06-25 ENCOUNTER — Ambulatory Visit (HOSPITAL_COMMUNITY)
Admission: RE | Admit: 2023-06-25 | Discharge: 2023-06-25 | Disposition: A | Discharge status: Home / Self Care | Payer: No Typology Code available for payment source | Source: Ambulatory Visit | Attending: Internal Medicine | Admitting: Internal Medicine

## 2023-06-25 DIAGNOSIS — F09 Unspecified mental disorder due to known physiological condition: Secondary | ICD-10-CM | POA: Insufficient documentation

## 2023-06-30 ENCOUNTER — Ambulatory Visit
Admission: RE | Admit: 2023-06-30 | Discharge: 2023-06-30 | Disposition: A | Discharge status: Home / Self Care | Payer: Medicare PPO | Source: Ambulatory Visit | Attending: Cardiology | Admitting: Cardiology

## 2023-06-30 DIAGNOSIS — I2510 Atherosclerotic heart disease of native coronary artery without angina pectoris: Secondary | ICD-10-CM | POA: Insufficient documentation

## 2023-06-30 DIAGNOSIS — I7121 Aneurysm of the ascending aorta, without rupture: Secondary | ICD-10-CM | POA: Insufficient documentation

## 2023-11-05 ENCOUNTER — Ambulatory Visit: Admission: EM | Admit: 2023-11-05 | Discharge: 2023-11-05 | Disposition: A | Discharge status: Home / Self Care

## 2023-11-05 DIAGNOSIS — L282 Other prurigo: Secondary | ICD-10-CM

## 2023-11-05 DIAGNOSIS — S30860A Insect bite (nonvenomous) of lower back and pelvis, initial encounter: Secondary | ICD-10-CM | POA: Diagnosis not present

## 2023-11-05 DIAGNOSIS — W57XXXA Bitten or stung by nonvenomous insect and other nonvenomous arthropods, initial encounter: Secondary | ICD-10-CM

## 2023-11-05 MED ORDER — TRIAMCINOLONE ACETONIDE 0.1 % EX CREA: 1.0000 | TOPICAL_CREAM | Freq: Two times a day (BID) | CUTANEOUS | 0 refills | Status: AC

## 2023-11-05 MED ORDER — DOXYCYCLINE HYCLATE 100 MG PO TABS
100.0000 mg | ORAL_TABLET | Freq: Two times a day (BID) | ORAL | 0 refills | Status: AC
Start: 2023-11-05 — End: 2023-11-12

## 2023-11-05 NOTE — ED Provider Notes (Signed)
 [facility]    CSN: [id number] Arrival date & time: 11/05/23  0808      History   Chief Complaint Chief Complaint  Patient presents with   Tick Removal    HPI Scott Zamora. is a 89 y.o. male.   The history is provided by the patient.   Patient presents for complaints of a tick bite to the right lower back that he noticed over the past 24 hours.  Patient states that the tick was "embedded" into his skin when he realized it was a tick.  Patient states symptoms started with itching to the area, which was noted to be a tick.  Patient states that his wife removed the tick, he is unsure if she removed the tick in its entirety.  He denies fever, chills, joint pain, joint swelling, fatigue, weakness, or a bull's-eye appearing rash.    Past Medical History:  Diagnosis Date   Aortic aneurysm (HCC)    Arthritis    BPH (benign prostatic hyperplasia)    Cervical radiculopathy    Dry skin    GERD (gastroesophageal reflux disease)    Hypertension    Inguinal hernia, left    Mixed hyperlipidemia    Onychomycosis    Seasonal allergies    Sleep apnea    has CPAP but does not wear it   Thrombocytopenia (HCC)    Vertigo    hx of   Vitamin D deficiency     Patient Active Problem List   Diagnosis Date Noted   Pseudophakia, both eyes 03/23/2022   Elevated troponin 02/23/2022   Intermediate stage nonexudative age-related macular degeneration of left eye 02/10/2021   Thrombocytopenia (HCC) 03/14/2020   Intermediate stage nonexudative age-related macular degeneration of right eye 11/21/2019   Left retinoschisis 11/21/2019   Essential hypertension 05/11/2019   Gastroesophageal reflux disease without esophagitis 05/11/2019   S/P shoulder replacement 08/29/2012   Pain in joint, shoulder region 08/29/2012   Muscle weakness (generalized) 08/29/2012   Arthritis of shoulder 08/12/2012    Past Surgical History:  Procedure Laterality Date   CATARACT EXTRACTION W/PHACO  Bilateral    Dr. Gennie Kicks   COLONOSCOPY W/ POLYPECTOMY     EYE SURGERY Bilateral    HERNIA REPAIR     INGUINAL HERNIA REPAIR Left 04/18/2014   Procedure: HERNIA REPAIR INGUINAL ADULT;  Surgeon: Beau Bound, MD;  Location: [facility];  Service: General;  Laterality: Left;   INSERTION OF MESH Left 04/18/2014   Procedure: INSERTION OF MESH;  Surgeon: Beau Bound, MD;  Location: [facility];  Service: General;  Laterality: Left;   KNEE ARTHROSCOPY     left knee   TOTAL SHOULDER ARTHROPLASTY Left 08/11/2012   Dr Ellard Gunning   TOTAL SHOULDER ARTHROPLASTY  08/11/2012   Procedure: TOTAL SHOULDER ARTHROPLASTY;  Surgeon: Glo Larch, MD;  Location: [facility];  Service: Orthopedics;  Laterality: Left;   TOTAL SHOULDER ARTHROPLASTY Right 09/12/2015   Procedure: RIGHT TOTAL SHOULDER ARTHROPLASTY;  Surgeon: Ellard Gunning, MD;  Location: [facility];  Service: Orthopedics;  Laterality: Right;       Home Medications    Prior to Admission medications   Medication Sig Start Date End Date Taking? Authorizing Provider  pravastatin (PRAVACHOL) 20 MG tablet Take 20 mg by mouth. 03/21/21  Yes [provider]  ascorbic acid (VITAMIN C) 250 MG tablet Take 250 mg by mouth daily.    [provider]  aspirin EC 81 MG tablet Take 81 mg by  mouth daily.    [provider]  benzonatate  (TESSALON ) 200 MG capsule Take 1 capsule (200 mg total) by mouth 3 (three) times daily as needed for cough. 08/17/22   Corbin Dess, PA-C  Cholecalciferol (VITAMIN D-3) 1000 UNITS CAPS Take 1,000 Units by mouth daily.    [provider]  Coenzyme Q10 (COQ-10 PO) Take 1 tablet by mouth daily. Reported on 09/26/2015    [provider]  diazepam  (VALIUM ) 2 MG tablet Take 1 tablet (2 mg total) by mouth every 6 (six) hours as needed for muscle spasms or sedation. Patient not taking: Reported on 02/24/2022 09/13/15   Shuford, Sherrlyn Dolores, PA-C  ferrous sulfate 325 (65 FE) MG tablet Take 325 mg by mouth daily.     [provider]  hydrochlorothiazide  (HYDRODIURIL ) 25 MG tablet Take 12.5 mg by mouth daily.    [provider]  LINZESS 145 MCG CAPS capsule Take 145 mcg by mouth every morning. 02/02/22   [provider]  loratadine (CLARITIN) 10 MG tablet Take 10 mg by mouth daily as needed. For allergies    [provider]  omeprazole (PRILOSEC) 20 MG capsule Take 20 mg by mouth daily.    [provider]  tamsulosin  (FLOMAX ) 0.4 MG CAPS capsule Take 0.4 mg by mouth daily after supper.    [provider]  verapamil  (CALAN -SR) 240 MG CR tablet Take 240 mg by mouth at bedtime.    [provider]    Family History Family History  Problem Relation Age of Onset   Hypertension Mother    Diabetes Mother    Cancer Sister    Hypertension Brother    Diabetes Brother    Hypertension Sister     Social History Social History   Tobacco Use   Smoking status: Never   Smokeless tobacco: Never  Vaping Use   Vaping status: Never Used  Substance Use Topics   Alcohol use: No   Drug use: No     Allergies   Patient has no known allergies.   Review of Systems Review of Systems Per HPI  Physical Exam Triage Vital Signs ED Triage Vitals  Encounter Vitals Group     BP 11/05/23 0839 (!) 147/87     Systolic BP Percentile --      Diastolic BP Percentile --      Pulse Rate 11/05/23 0839 64     Resp 11/05/23 0839 16     Temp 11/05/23 0839 97.7 F (36.5 C)     Temp Source 11/05/23 0839 Oral     SpO2 11/05/23 0839 96 %     Weight --      Height --      Head Circumference --      Peak Flow --      Pain Score 11/05/23 0840 0     Pain Loc --      Pain Education --      Exclude from Growth Chart --    No data found.  Updated Vital Signs BP (!) 147/87 (BP Location: Right Arm)   Pulse 64   Temp 97.7 F (36.5 C) (Oral)   Resp 16   SpO2 96%   Visual Acuity Right Eye Distance:   Left Eye Distance:   Bilateral Distance:    Right  Eye Near:   Left Eye Near:    Bilateral Near:     Physical Exam Vitals and nursing note reviewed.  Constitutional:      Appearance:  Normal appearance.  HENT:     Head: Normocephalic.  Eyes:     Extraocular Movements: Extraocular movements intact.     Conjunctiva/sclera: Conjunctivae normal.     Pupils: Pupils are equal, round, and reactive to light.  Cardiovascular:     Rate and Rhythm: Normal rate and regular rhythm.     Pulses: Normal pulses.     Heart sounds: Normal heart sounds.  Pulmonary:     Effort: Pulmonary effort is normal. No respiratory distress.     Breath sounds: Normal breath sounds. No stridor. No wheezing, rhonchi or rales.  Abdominal:     General: Bowel sounds are normal.     Palpations: Abdomen is soft.     Tenderness: There is no abdominal tenderness.  Musculoskeletal:     Cervical back: Normal range of motion.  Skin:    General: Skin is warm and dry.       Neurological:     General: No focal deficit present.     Mental Status: He is alert and oriented to person, place, and time.  Psychiatric:        Mood and Affect: Mood normal.        Behavior: Behavior normal.      UC Treatments / Results  Labs (all labs ordered are listed, but only abnormal results are displayed) Labs Reviewed - No data to display  EKG   Radiology No results found.  Procedures Procedures (including critical care time)  Medications Ordered in UC Medications - No data to display  Initial Impression / Assessment and Plan / UC Course  I have reviewed the triage vital signs and the nursing notes.  Pertinent labs & imaging results that were available during my care of the patient were reviewed by me and considered in my medical decision making (see chart for details).  Patient presents for tick bite to the right lower back.  Patient states tick was embedded and was attached for approximately 3 to 4 days.  Will treat patient empirically with doxycycline  100 mg twice  daily for the next 7 days.  Triamcinolone cream 0.1% prescribed for local application for itching and erythema.  Supportive care recommendations were provided and discussed with the patient to include fluids, rest, and to monitor the area for a bull's-eye rash, and for worsening symptoms.  Patient was given strict ER follow-up precautions.  Patient was in agreement with this plan of care and verbalizes understanding.  All questions were answered.  Patient stable for discharge.  Final Clinical Impressions(s) / UC Diagnoses   Final diagnoses:  None   Discharge Instructions   None    ED Prescriptions   None    PDMP not reviewed this encounter.   Hardy Lia, NP 11/05/23 [phone]

## 2023-11-05 NOTE — Discharge Instructions (Addendum)
 Take medication as prescribed. May take over-the-counter Tylenol  as needed for pain, fever, or general discomfort. Continue to monitor the area for worsening.  Go to the emergency department immediately if you develop a bull's-eye appearing rash around the site, develop joint pain, joint swelling, severe fatigue, or other concerns. Follow-up as needed.

## 2023-11-05 NOTE — ED Triage Notes (Signed)
 Pt states he had a tick on his right back for the past 3-4 days.  States he is not sure if he got the whole tick off of him. States his back is itchy.

## 2024-05-09 ENCOUNTER — Ambulatory Visit: Admitting: General Surgery

## 2024-05-22 ENCOUNTER — Ambulatory Visit
Admission: EM | Admit: 2024-05-22 | Discharge: 2024-05-22 | Disposition: A | Discharge status: Home / Self Care | Attending: Family Medicine | Admitting: Family Medicine

## 2024-05-22 DIAGNOSIS — J069 Acute upper respiratory infection, unspecified: Secondary | ICD-10-CM | POA: Diagnosis not present

## 2024-05-22 DIAGNOSIS — R030 Elevated blood-pressure reading, without diagnosis of hypertension: Secondary | ICD-10-CM | POA: Diagnosis not present

## 2024-05-22 LAB — POC COVID19/FLU A&B COMBO
Covid Antigen, POC: NEGATIVE
Influenza A Antigen, POC: NEGATIVE
Influenza B Antigen, POC: NEGATIVE

## 2024-05-22 MED ORDER — GUAIFENESIN ER 600 MG PO TB12: 600.0000 mg | ORAL_TABLET | Freq: Two times a day (BID) | ORAL | 0 refills | Status: AC | PRN

## 2024-05-22 MED ORDER — BENZONATATE 200 MG PO CAPS: 200.0000 mg | ORAL_CAPSULE | Freq: Three times a day (TID) | ORAL | 0 refills | Status: AC | PRN

## 2024-05-22 NOTE — ED Triage Notes (Signed)
 Pt reports cough fever and chills. Started Saturday.

## 2024-05-22 NOTE — ED Provider Notes (Signed)
 [facility]    CSN: [id number] Arrival date & time: 05/22/24  0807      History   Chief Complaint No chief complaint on file.   HPI Scott Zamora. is a 89 y.o. male.   Patient presenting today with 3-day history of cough, low-grade fever, chills, congestion, scratchy throat.  Denies chest pain, shortness of breath, wheezing, abdominal pain, vomiting, diarrhea.  So far not trying anything over-the-counter for symptoms as historically over-the-counter cold and congestion medications have not helped him.  He states Tessalon  Perles do typically help him.  Denies history of chronic pulmonary disease.    Past Medical History:  Diagnosis Date   Aortic aneurysm    Arthritis    BPH (benign prostatic hyperplasia)    Cervical radiculopathy    Dry skin    GERD (gastroesophageal reflux disease)    Hypertension    Inguinal hernia, left    Mixed hyperlipidemia    Onychomycosis    Seasonal allergies    Sleep apnea    has CPAP but does not wear it   Thrombocytopenia    Vertigo    hx of   Vitamin D deficiency     Patient Active Problem List   Diagnosis Date Noted   Pseudophakia, both eyes 03/23/2022   Elevated troponin 02/23/2022   Intermediate stage nonexudative age-related macular degeneration of left eye 02/10/2021   Thrombocytopenia 03/14/2020   Intermediate stage nonexudative age-related macular degeneration of right eye 11/21/2019   Left retinoschisis 11/21/2019   Essential hypertension 05/11/2019   Gastroesophageal reflux disease without esophagitis 05/11/2019   S/P shoulder replacement 08/29/2012   Pain in joint, shoulder region 08/29/2012   Muscle weakness (generalized) 08/29/2012   Arthritis of shoulder 08/12/2012    Past Surgical History:  Procedure Laterality Date   CATARACT EXTRACTION W/PHACO Bilateral    Dr. Roz   COLONOSCOPY W/ POLYPECTOMY     EYE SURGERY Bilateral    HERNIA REPAIR     INGUINAL HERNIA REPAIR Left 04/18/2014    Procedure: HERNIA REPAIR INGUINAL ADULT;  Surgeon: Oneil DELENA Budge, MD;  Location: [facility];  Service: General;  Laterality: Left;   INSERTION OF MESH Left 04/18/2014   Procedure: INSERTION OF MESH;  Surgeon: Oneil DELENA Budge, MD;  Location: [facility];  Service: General;  Laterality: Left;   KNEE ARTHROSCOPY     left knee   TOTAL SHOULDER ARTHROPLASTY Left 08/11/2012   Dr Franky Pointer   TOTAL SHOULDER ARTHROPLASTY  08/11/2012   Procedure: TOTAL SHOULDER ARTHROPLASTY;  Surgeon: Franky CHRISTELLA Pointer, MD;  Location: [facility];  Service: Orthopedics;  Laterality: Left;   TOTAL SHOULDER ARTHROPLASTY Right 09/12/2015   Procedure: RIGHT TOTAL SHOULDER ARTHROPLASTY;  Surgeon: Franky Pointer, MD;  Location: [facility];  Service: Orthopedics;  Laterality: Right;       Home Medications    Prior to Admission medications   Medication Sig Start Date End Date Taking? Authorizing Provider  benzonatate  (TESSALON ) 200 MG capsule Take 1 capsule (200 mg total) by mouth 3 (three) times daily as needed for cough. 05/22/24  Yes Stuart Vernell Norris, PA-C  guaiFENesin (MUCINEX) 600 MG 12 hr tablet Take 1 tablet (600 mg total) by mouth 2 (two) times daily as needed. 05/22/24  Yes Stuart Vernell Norris, PA-C  ascorbic acid (VITAMIN C) 250 MG tablet Take 250 mg by mouth daily.    [provider]  aspirin EC 81 MG tablet Take 81 mg by mouth daily.    [provider]  Cholecalciferol (VITAMIN D-3) 1000 UNITS CAPS Take 1,000 Units by mouth daily.    [provider]  Coenzyme Q10 (COQ-10 PO) Take 1 tablet by mouth daily. Reported on 09/26/2015    [provider]  diazepam  (VALIUM ) 2 MG tablet Take 1 tablet (2 mg total) by mouth every 6 (six) hours as needed for muscle spasms or sedation. Patient not taking: Reported on 02/24/2022 09/13/15   Shuford, Randine, PA-C  ferrous sulfate 325 (65 FE) MG tablet Take 325 mg by mouth daily.    [provider]  hydrochlorothiazide  (HYDRODIURIL ) 25 MG tablet Take  12.5 mg by mouth daily.    [provider]  LINZESS 145 MCG CAPS capsule Take 145 mcg by mouth every morning. 02/02/22   [provider]  loratadine (CLARITIN) 10 MG tablet Take 10 mg by mouth daily as needed. For allergies    [provider]  omeprazole (PRILOSEC) 20 MG capsule Take 20 mg by mouth daily.    [provider]  pravastatin (PRAVACHOL) 20 MG tablet Take 20 mg by mouth. 03/21/21   [provider]  tamsulosin  (FLOMAX ) 0.4 MG CAPS capsule Take 0.4 mg by mouth daily after supper.    [provider]  triamcinolone  cream (KENALOG ) 0.1 % Apply 1 Application topically 2 (two) times daily. 11/05/23   Leath-Warren, Etta PARAS, NP  verapamil  (CALAN -SR) 240 MG CR tablet Take 240 mg by mouth at bedtime.    [provider]    Family History Family History  Problem Relation Age of Onset   Hypertension Mother    Diabetes Mother    Cancer Sister    Hypertension Brother    Diabetes Brother    Hypertension Sister     Social History Social History   Tobacco Use   Smoking status: Never   Smokeless tobacco: Never  Vaping Use   Vaping status: Never Used  Substance Use Topics   Alcohol use: No   Drug use: No     Allergies   Patient has no known allergies.   Review of Systems Review of Systems Per HPI  Physical Exam Triage Vital Signs ED Triage Vitals  Encounter Vitals Group     BP 05/22/24 0826 (!) 167/86     Girls Systolic BP Percentile --      Girls Diastolic BP Percentile --      Boys Systolic BP Percentile --      Boys Diastolic BP Percentile --      Pulse Rate 05/22/24 0826 81     Resp 05/22/24 0826 20     Temp 05/22/24 0903 98.1 F (36.7 C)     Temp Source 05/22/24 0903 Oral     SpO2 05/22/24 0826 96 %     Weight --      Height --      Head Circumference --      Peak Flow --      Pain Score 05/22/24 0828 5     Pain Loc --      Pain Education --      Exclude from Growth Chart --    No data  found.  Updated Vital Signs BP (!) 167/86 (BP Location: Right Arm)   Pulse 81   Temp 98.1 F (36.7 C) (Oral)   Resp 20   SpO2 96%   Visual Acuity Right Eye Distance:   Left Eye Distance:   Bilateral Distance:    Right Eye Near:   Left Eye Near:  Bilateral Near:     Physical Exam Vitals and nursing note reviewed.  Constitutional:      Appearance: He is well-developed.  HENT:     Head: Atraumatic.     Right Ear: External ear normal.     Left Ear: External ear normal.     Nose: Rhinorrhea present.     Mouth/Throat:     Pharynx: Posterior oropharyngeal erythema present. No oropharyngeal exudate.  Eyes:     Conjunctiva/sclera: Conjunctivae normal.     Pupils: Pupils are equal, round, and reactive to light.  Cardiovascular:     Rate and Rhythm: Normal rate and regular rhythm.  Pulmonary:     Effort: Pulmonary effort is normal. No respiratory distress.     Breath sounds: No wheezing or rales.  Musculoskeletal:        General: Normal range of motion.     Cervical back: Normal range of motion and neck supple.  Lymphadenopathy:     Cervical: No cervical adenopathy.  Skin:    General: Skin is warm and dry.  Neurological:     Mental Status: He is alert and oriented to person, place, and time.  Psychiatric:        Behavior: Behavior normal.      UC Treatments / Results  Labs (all labs ordered are listed, but only abnormal results are displayed) Labs Reviewed  POC COVID19/FLU A&B COMBO    EKG   Radiology No results found.  Procedures Procedures (including critical care time)  Medications Ordered in UC Medications - No data to display  Initial Impression / Assessment and Plan / UC Course  I have reviewed the triage vital signs and the nursing notes.  Pertinent labs & imaging results that were available during my care of the patient were reviewed by me and considered in my medical decision making (see chart for details).     Hypertensive in triage,  otherwise vital signs reassuring.  Rapid flu and COVID-negative.  Suspect viral respiratory infection.  Discussed continued monitoring of blood pressures and follow-up with PCP if not returning to within normal limits.  Will treat with Tessalon , plain Mucinex and discussed blood pressures take medications such as Flonase , antihistamines, Coricidin HBP.  Return for worsening or unresolving symptoms.  Final Clinical Impressions(s) / UC Diagnoses   Final diagnoses:  Viral URI with cough  Elevated blood pressure reading   Discharge Instructions   None    ED Prescriptions     Medication Sig Dispense Auth. Provider   benzonatate  (TESSALON ) 200 MG capsule Take 1 capsule (200 mg total) by mouth 3 (three) times daily as needed for cough. 20 capsule Stuart Vernell Norris, PA-C   guaiFENesin (MUCINEX) 600 MG 12 hr tablet Take 1 tablet (600 mg total) by mouth 2 (two) times daily as needed. 20 tablet Stuart Vernell Norris, [place]      PDMP not reviewed this encounter.   Stuart Vernell Norris, [place] 05/22/24 1324
